# Patient Record
Sex: Female | Born: 1960
Health system: Southern US, Community
[De-identification: ages and names within clinical notes are randomized; demographics above are authoritative.]

## PROBLEM LIST (undated history)

## (undated) DIAGNOSIS — E7800 Pure hypercholesterolemia, unspecified: Secondary | ICD-10-CM

## (undated) DIAGNOSIS — K921 Melena: Secondary | ICD-10-CM

## (undated) DIAGNOSIS — N200 Calculus of kidney: Secondary | ICD-10-CM

## (undated) DIAGNOSIS — S83281A Other tear of lateral meniscus, current injury, right knee, initial encounter: Secondary | ICD-10-CM

## (undated) DIAGNOSIS — K219 Gastro-esophageal reflux disease without esophagitis: Secondary | ICD-10-CM

## (undated) DIAGNOSIS — F419 Anxiety disorder, unspecified: Secondary | ICD-10-CM

## (undated) DIAGNOSIS — Z87442 Personal history of urinary calculi: Secondary | ICD-10-CM

## (undated) DIAGNOSIS — M1990 Unspecified osteoarthritis, unspecified site: Secondary | ICD-10-CM

## (undated) DIAGNOSIS — S83241A Other tear of medial meniscus, current injury, right knee, initial encounter: Secondary | ICD-10-CM

## (undated) DIAGNOSIS — N189 Chronic kidney disease, unspecified: Secondary | ICD-10-CM

## (undated) DIAGNOSIS — I10 Essential (primary) hypertension: Secondary | ICD-10-CM

## (undated) DIAGNOSIS — E785 Hyperlipidemia, unspecified: Secondary | ICD-10-CM

## (undated) HISTORY — DX: Calculus of kidney: N20.0

## (undated) HISTORY — DX: Melena: K92.1

## (undated) HISTORY — DX: Gastro-esophageal reflux disease without esophagitis: K21.9

## (undated) HISTORY — DX: Hyperlipidemia, unspecified: E78.5

---

## 1999-04-15 ENCOUNTER — Emergency Department (HOSPITAL_COMMUNITY): Admission: EM | Admit: 1999-04-15 | Discharge: 1999-04-15 | Payer: Self-pay | Admitting: Emergency Medicine

## 1999-04-15 ENCOUNTER — Encounter: Payer: Self-pay | Admitting: Emergency Medicine

## 2001-12-09 ENCOUNTER — Emergency Department (HOSPITAL_COMMUNITY): Admission: EM | Admit: 2001-12-09 | Discharge: 2001-12-09 | Payer: Self-pay

## 2003-08-09 ENCOUNTER — Emergency Department (HOSPITAL_COMMUNITY): Admission: EM | Admit: 2003-08-09 | Discharge: 2003-08-09 | Payer: Self-pay | Admitting: Emergency Medicine

## 2006-04-28 ENCOUNTER — Other Ambulatory Visit: Admission: RE | Admit: 2006-04-28 | Discharge: 2006-04-28 | Payer: Self-pay | Admitting: *Deleted

## 2006-05-12 ENCOUNTER — Encounter (INDEPENDENT_AMBULATORY_CARE_PROVIDER_SITE_OTHER): Payer: Self-pay | Admitting: Specialist

## 2006-05-12 ENCOUNTER — Ambulatory Visit (HOSPITAL_COMMUNITY): Admission: RE | Admit: 2006-05-12 | Discharge: 2006-05-12 | Payer: Self-pay | Admitting: Gastroenterology

## 2006-12-20 ENCOUNTER — Emergency Department (HOSPITAL_COMMUNITY): Admission: EM | Admit: 2006-12-20 | Discharge: 2006-12-20 | Payer: Self-pay | Admitting: Emergency Medicine

## 2007-07-26 ENCOUNTER — Emergency Department (HOSPITAL_COMMUNITY): Admission: EM | Admit: 2007-07-26 | Discharge: 2007-07-26 | Payer: Self-pay | Admitting: Family Medicine

## 2007-12-21 ENCOUNTER — Emergency Department (HOSPITAL_COMMUNITY): Admission: EM | Admit: 2007-12-21 | Discharge: 2007-12-21 | Payer: Self-pay | Admitting: Family Medicine

## 2008-03-12 ENCOUNTER — Emergency Department (HOSPITAL_COMMUNITY): Admission: EM | Admit: 2008-03-12 | Discharge: 2008-03-12 | Payer: Self-pay | Admitting: Emergency Medicine

## 2008-07-13 ENCOUNTER — Emergency Department (HOSPITAL_COMMUNITY): Admission: EM | Admit: 2008-07-13 | Discharge: 2008-07-13 | Payer: Self-pay | Admitting: Family Medicine

## 2008-09-12 ENCOUNTER — Encounter: Admission: RE | Admit: 2008-09-12 | Discharge: 2008-09-12 | Payer: Self-pay | Admitting: Internal Medicine

## 2009-04-04 HISTORY — PX: OTHER SURGICAL HISTORY: SHX169

## 2009-04-04 HISTORY — PX: BREAST EXCISIONAL BIOPSY: SUR124

## 2009-07-24 ENCOUNTER — Emergency Department (HOSPITAL_COMMUNITY): Admission: EM | Admit: 2009-07-24 | Discharge: 2009-07-24 | Payer: Self-pay | Admitting: Emergency Medicine

## 2009-12-28 ENCOUNTER — Encounter: Admission: RE | Admit: 2009-12-28 | Discharge: 2009-12-28 | Payer: Self-pay | Admitting: Cardiology

## 2010-01-07 ENCOUNTER — Encounter: Admission: RE | Admit: 2010-01-07 | Discharge: 2010-01-07 | Payer: Self-pay | Admitting: Cardiology

## 2010-01-21 ENCOUNTER — Encounter: Admission: RE | Admit: 2010-01-21 | Discharge: 2010-01-21 | Payer: Self-pay | Admitting: General Surgery

## 2010-01-21 ENCOUNTER — Ambulatory Visit (HOSPITAL_BASED_OUTPATIENT_CLINIC_OR_DEPARTMENT_OTHER): Admission: RE | Admit: 2010-01-21 | Discharge: 2010-01-21 | Payer: Self-pay | Admitting: General Surgery

## 2010-04-24 ENCOUNTER — Encounter: Payer: Self-pay | Admitting: Cardiology

## 2010-04-25 ENCOUNTER — Encounter: Payer: Self-pay | Admitting: *Deleted

## 2010-06-16 LAB — BASIC METABOLIC PANEL
BUN: 8 mg/dL (ref 6–23)
CO2: 29 mEq/L (ref 19–32)
Calcium: 9.6 mg/dL (ref 8.4–10.5)
Creatinine, Ser: 0.82 mg/dL (ref 0.4–1.2)
GFR calc non Af Amer: >60 mL/min (ref >60)
Glucose, Bld: 100 mg/dL — ABNORMAL HIGH (ref 70–99)

## 2010-06-16 LAB — DIFFERENTIAL
Basophils Absolute: 0 10*3/uL (ref 0.0–0.1)
Basophils Relative: 0 % (ref 0–1)
Eosinophils Absolute: 0.2 10*3/uL (ref 0.0–0.7)
Monocytes Absolute: 0.7 10*3/uL (ref 0.1–1.0)
Neutro Abs: 6.4 10*3/uL (ref 1.7–7.7)
Neutrophils Relative %: 70 % (ref 43–77)

## 2010-06-16 LAB — CBC
MCH: 28.4 pg (ref 26.0–34.0)
MCHC: 32.6 g/dL (ref 30.0–36.0)
RDW: 13.2 % (ref 11.5–15.5)

## 2010-06-30 ENCOUNTER — Inpatient Hospital Stay (INDEPENDENT_AMBULATORY_CARE_PROVIDER_SITE_OTHER)
Admission: RE | Admit: 2010-06-30 | Discharge: 2010-06-30 | Disposition: A | Discharge status: Home / Self Care | Payer: 59 | Source: Ambulatory Visit | Attending: Family Medicine | Admitting: Family Medicine

## 2010-06-30 DIAGNOSIS — J309 Allergic rhinitis, unspecified: Secondary | ICD-10-CM

## 2010-08-20 NOTE — Op Note (Signed)
Alice Ryan, Alice Ryan            ACCOUNT NO.:  [id number]   MEDICAL RECORD NO.:  [id number]          PATIENT TYPE:  AMB   LOCATION:  ENDO                         FACILITY:  [facility]   PHYSICIAN:  Shirley Friar, MDDATE OF BIRTH:  1960/08/30   DATE OF PROCEDURE:  05/12/2006  DATE OF DISCHARGE:                               OPERATIVE REPORT   PROCEDURE:  Upper endoscopy.   INDICATIONS:  Iron deficiency anemia.   MEDICATIONS:  Fentanyl 25 mcg IV, additional medicines given with  preceding colonoscopy.   FINDINGS:  Endoscope was inserted to the oropharynx and esophagus was  intubated which was normal in its entirety.  Endoscope was advanced down  into the stomach which revealed normal mucosal lining without any ulcers  or erythema.  Retroflexion was done which revealed a medium sized  sliding hiatal hernia but otherwise normal proximal stomach.  Endoscope  was straightened advanced down to the duodenal bulb and second portion  of duodenum which both appeared normal.  Two biopsies were taken in the  second portion of duodenum to evaluate for celiac sprue.  Endoscope was  withdrawn to confirm the above findings.   ASSESSMENT:  1. Medium sized sliding hiatal hernia, otherwise normal EGD.  2. Status post duodenal biopsies to evaluate for celiac sprue.   PLAN:  1. Follow-up on pathology but suspect that if biopsies negative then      iron-deficiency anemia secondary to menstrual losses.  2. Start iron pills twice a day.      Shirley Friar, MD  Electronically Signed     VCS/MEDQ  D:  05/12/2006  T:  05/13/2006  Job:  [id number]   cc:   Bess Kinds, MD

## 2010-08-20 NOTE — Op Note (Signed)
Alice Ryan, Alice Ryan            ACCOUNT NO.:  [id number]   MEDICAL RECORD NO.:  [id number]          PATIENT TYPE:  AMB   LOCATION:  ENDO                         FACILITY:  [facility]   PHYSICIAN:  Shirley Friar, MDDATE OF BIRTH:  [birth year]   DATE OF PROCEDURE:  05/12/2006  DATE OF DISCHARGE:                               OPERATIVE REPORT   PROCEDURE:  Colonoscopy.   INDICATION:  Rectal bleeding, anemia.   MEDICATIONS:  Fentanyl 75 mcg IV, Versed  10 mg IV.   FINDINGS:  Rectal exam was normal.  A pediatric Pentax colonoscope was  inserted into a well prepped colon and advanced to the cecum where the  ileocecal valve and appendiceal orifice were identified.  Despite  multiple attempts, I was unable to intubate the terminal ileum, but the  opening appeared to be normal in appearance.  On careful withdrawal of  the colonoscope, no mucosal abnormalities were noted.  Retroflexion  revealed small internal hemorrhoids.   ASSESSMENT:  Small internal hemorrhoids otherwise normal colonoscopy.   PLAN:  1. High-fiber diet.  2. Anusol suppositories as needed.  3. Proceed with EGD.      Shirley Friar, MD  Electronically Signed     VCS/MEDQ  D:  05/12/2006  T:  05/13/2006  Job:  [id number]   cc:   Bess Kinds, MD

## 2010-10-20 ENCOUNTER — Other Ambulatory Visit: Payer: Self-pay | Admitting: Cardiology

## 2010-10-20 DIAGNOSIS — R921 Mammographic calcification found on diagnostic imaging of breast: Secondary | ICD-10-CM

## 2010-12-30 ENCOUNTER — Ambulatory Visit
Admission: RE | Admit: 2010-12-30 | Discharge: 2010-12-30 | Disposition: A | Discharge status: Home / Self Care | Payer: 59 | Source: Ambulatory Visit | Attending: Cardiology | Admitting: Cardiology

## 2010-12-30 DIAGNOSIS — R921 Mammographic calcification found on diagnostic imaging of breast: Secondary | ICD-10-CM

## 2011-01-07 LAB — DIFFERENTIAL
Basophils Absolute: 0 10*3/uL (ref 0.0–0.1)
Basophils Relative: 0 % (ref 0–1)
Lymphocytes Relative: 20 % (ref 12–46)
Monocytes Absolute: 0.8 10*3/uL (ref 0.1–1.0)
Neutro Abs: 5.9 10*3/uL (ref 1.7–7.7)
Neutrophils Relative %: 68 % (ref 43–77)

## 2011-01-07 LAB — B-NATRIURETIC PEPTIDE (CONVERTED LAB): Pro B Natriuretic peptide (BNP): 36 pg/mL (ref 0.0–100.0)

## 2011-01-07 LAB — BASIC METABOLIC PANEL
CO2: 26 mEq/L (ref 19–32)
Calcium: 9.1 mg/dL (ref 8.4–10.5)
Creatinine, Ser: 0.8 mg/dL (ref 0.4–1.2)
GFR calc Af Amer: >60 mL/min (ref >60)
GFR calc non Af Amer: >60 mL/min (ref >60)
Glucose, Bld: 100 mg/dL — ABNORMAL HIGH (ref 70–99)
Sodium: 134 mEq/L — ABNORMAL LOW (ref 135–145)

## 2011-01-07 LAB — POCT CARDIAC MARKERS
CKMB, poc: <1 ng/mL — ABNORMAL LOW (ref 1.0–8.0)
Myoglobin, poc: 62.2 ng/mL (ref 12–200)

## 2011-01-07 LAB — CBC
Hemoglobin: 11.4 g/dL — ABNORMAL LOW (ref 12.0–15.0)
MCHC: 32.4 g/dL (ref 30.0–36.0)
RDW: 14.7 % (ref 11.5–15.5)

## 2011-05-21 ENCOUNTER — Encounter (HOSPITAL_COMMUNITY): Payer: Self-pay

## 2011-05-21 ENCOUNTER — Emergency Department (INDEPENDENT_AMBULATORY_CARE_PROVIDER_SITE_OTHER)
Admission: EM | Admit: 2011-05-21 | Discharge: 2011-05-21 | Disposition: A | Discharge status: Home / Self Care | Payer: Self-pay | Source: Home / Self Care | Attending: Family Medicine | Admitting: Family Medicine

## 2011-05-21 DIAGNOSIS — M67929 Unspecified disorder of synovium and tendon, unspecified upper arm: Secondary | ICD-10-CM

## 2011-05-21 DIAGNOSIS — M25519 Pain in unspecified shoulder: Secondary | ICD-10-CM

## 2011-05-21 DIAGNOSIS — M7520 Bicipital tendinitis, unspecified shoulder: Secondary | ICD-10-CM

## 2011-05-21 HISTORY — DX: Pure hypercholesterolemia, unspecified: E78.00

## 2011-05-21 HISTORY — DX: Essential (primary) hypertension: I10

## 2011-05-21 HISTORY — DX: Gastro-esophageal reflux disease without esophagitis: K21.9
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
## 2011-05-21 NOTE — Discharge Instructions (Signed)
You may take one to two naproxen (Aleve) tablets every 12 hours over the next 5 to 7 days, or until symptoms improve. You may also use ibuprofen 400 to 600 mg every 8 hours. Do not use both. Return to care should your symptoms not improve, or worsen in any way, such as numbness, tingling, or weakness or loss of grip strength.

## 2011-10-08 ENCOUNTER — Encounter (HOSPITAL_COMMUNITY): Payer: Self-pay | Admitting: *Deleted

## 2011-10-08 ENCOUNTER — Emergency Department (HOSPITAL_COMMUNITY)
Admission: EM | Admit: 2011-10-08 | Discharge: 2011-10-08 | Disposition: A | Discharge status: Home / Self Care | Payer: 59 | Attending: Emergency Medicine | Admitting: Emergency Medicine

## 2011-10-08 DIAGNOSIS — E7800 Pure hypercholesterolemia, unspecified: Secondary | ICD-10-CM | POA: Insufficient documentation

## 2011-10-08 DIAGNOSIS — Z91013 Allergy to seafood: Secondary | ICD-10-CM | POA: Insufficient documentation

## 2011-10-08 DIAGNOSIS — K219 Gastro-esophageal reflux disease without esophagitis: Secondary | ICD-10-CM | POA: Insufficient documentation

## 2011-10-08 DIAGNOSIS — A09 Infectious gastroenteritis and colitis, unspecified: Secondary | ICD-10-CM | POA: Insufficient documentation

## 2011-10-08 DIAGNOSIS — I10 Essential (primary) hypertension: Secondary | ICD-10-CM | POA: Insufficient documentation

## 2011-10-08 MED ORDER — METRONIDAZOLE 500 MG PO TABS: 500.0000 mg | ORAL_TABLET | Freq: Three times a day (TID) | ORAL | Status: AC

## 2011-10-08 NOTE — ED Notes (Signed)
Pt has seen for rash 2.5/3 weeks ago to left abdomen. States was rx'd antibiotics and prednisone. Pt reports she has had diarrhea going on 8th day. Reports nausea and 1 episode of vomiting after eating banana. Reports for the last week no nausea or vomiting.

## 2011-10-08 NOTE — ED Notes (Signed)
Discharged with instructions and verbalizes an understanding.

## 2011-10-08 NOTE — ED Provider Notes (Signed)
History     CSN: [id number]  Arrival date & time 10/08/11  0714   None     Chief Complaint  Patient presents with  . Diarrhea  . Rash    (Consider location/radiation/quality/duration/timing/severity/associated sxs/prior treatment) HPI  51 y.o. F p/w diarrhea of 7 days duration; started 3 days after a  7 day course of cephalexin for treatment of cellulitis of the left trunk; minimally improved over the course of 7 days; has noticed blood in the stool, but this has been present prior to the diarrhea, she associates the blood with internal hemorrhoids and says she had a colonoscopy to r/o cancer; diarrhea not alleviated by probiotics, imodium, and yogurt; diarrhea exacerbated by eating a banana last night; accompanied by vomiting x 2; denies diminished appetite or feeling dehydrated;  no history of C. Diff; Patient works the 7 PM - 7 AM shift on [address] at [employer]  Past Medical History  Diagnosis Date  . Hypertension   . Hypercholesteremia   . Acid reflux     Past Surgical History  Procedure Date  . Lumpectomy right breast 2011    benign    History reviewed. No pertinent family history.  History  Substance Use Topics  . Smoking status: Never Smoker   . Smokeless tobacco: Not on file  . Alcohol Use: No    OB History    Grav Para Term Preterm Abortions TAB SAB Ect Mult Living                  Review of Systems  Constitutional: Negative for fever, chills, diaphoresis, activity change and unexpected weight change.  HENT: Negative.   Eyes: Negative.   Respiratory: Negative.   Cardiovascular: Negative.   Gastrointestinal: Negative for abdominal distention.  Genitourinary: Negative.   Musculoskeletal: Negative.   Neurological: Negative.   Hematological: Negative.   Psychiatric/Behavioral: Negative.     Allergies  Apple; Shellfish allergy; and Peanut butter flavor  Home Medications   Current Outpatient Rx  Name Route Sig Dispense Refill  . ATORVASTATIN CALCIUM  10 MG PO TABS Oral Take 10 mg by mouth daily.    Marland Kitchen ESOMEPRAZOLE MAGNESIUM 40 MG PO CPDR Oral Take 40 mg by mouth daily before breakfast.    . OLMESARTAN MEDOXOMIL 20 MG PO TABS Oral Take 20 mg by mouth daily.     BP 125/74  Pulse 76  Temp 98.5 F (36.9 C) (Oral)  Resp 14  SpO2 100%   Physical Exam  Constitutional: She is oriented to person, place, and time. She appears well-developed and well-nourished. No distress.  HENT:  Head: Normocephalic and atraumatic.  Eyes: Conjunctivae and EOM are normal. Pupils are equal, round, and reactive to light.  Neck: Normal range of motion. Neck supple.  Cardiovascular: Normal rate, regular rhythm and normal heart sounds.   Pulmonary/Chest: Effort normal and breath sounds normal.  Abdominal: Soft. Bowel sounds are normal. She exhibits no distension and no mass. There is no tenderness. There is no guarding.  Neurological: She is alert and oriented to person, place, and time.  Skin: Skin is warm and dry. No rash noted. She is not diaphoretic.  Psychiatric: She has a normal mood and affect. Her behavior is normal.    ED Course  Procedures (including critical care time)   Labs Reviewed  CLOSTRIDIUM DIFFICILE BY PCR   No results found.   1. Infectious diarrhea       MDM  The patient has a history of recent antibiotic  use preceding her diarrhea and works in the hospital, so she has risk factors for C. Diff colitis. Therefore, I will treat her presumptively for mild C. Diff colitis and follow-up the stool culture that was ordered. She will need follow up in a non-emergent setting as long as she remains clinically stable.         Alice Buddy, MD 10/08/11 [phone]

## 2011-10-08 NOTE — ED Provider Notes (Signed)
I saw and evaluated the patient, reviewed the resident's note and I agree with the findings and plan.   .Face to face Exam:  General:  Awake HEENT:  Atraumatic Resp:  Normal effort Abd:  Nondistended Neuro:No focal weakness Lymph: No adenopathy   Nelia Shi, MD 10/08/11 [phone]

## 2011-10-10 ENCOUNTER — Telehealth: Payer: Self-pay | Admitting: Family Medicine

## 2011-10-10 NOTE — Telephone Encounter (Signed)
Patient is calling to find out results of tests run on Saturday in the ER.  Patient has not picked up the medication because she isn't supposed to start it until she has gotten the results.

## 2011-10-10 NOTE — Telephone Encounter (Signed)
Patient called and alerted that she is C. Diff negative and does not need to fill prescription for Flagyl. She notes that diarrhea is improving, and she will follow-up at [facility] as needed.

## 2012-01-02 ENCOUNTER — Other Ambulatory Visit: Payer: Self-pay | Admitting: Cardiology

## 2012-01-02 DIAGNOSIS — Z1231 Encounter for screening mammogram for malignant neoplasm of breast: Secondary | ICD-10-CM

## 2012-01-09 ENCOUNTER — Ambulatory Visit (INDEPENDENT_AMBULATORY_CARE_PROVIDER_SITE_OTHER): Payer: 59 | Admitting: Family Medicine

## 2012-01-09 ENCOUNTER — Encounter: Payer: Self-pay | Admitting: Family Medicine

## 2012-01-09 VITALS — BP 142/80 | Temp 98.1°F | Ht 61.0 in | Wt 128.5 lb

## 2012-01-09 DIAGNOSIS — K219 Gastro-esophageal reflux disease without esophagitis: Secondary | ICD-10-CM

## 2012-01-09 DIAGNOSIS — Z23 Encounter for immunization: Secondary | ICD-10-CM

## 2012-01-09 DIAGNOSIS — E785 Hyperlipidemia, unspecified: Secondary | ICD-10-CM

## 2012-01-09 DIAGNOSIS — I10 Essential (primary) hypertension: Secondary | ICD-10-CM

## 2012-01-09 LAB — LIPID PANEL
Cholesterol: 230 mg/dL — ABNORMAL HIGH (ref 0–200)
HDL: 69.8 mg/dL (ref >39.00)
Triglycerides: 98 mg/dL (ref 0.0–149.0)

## 2012-01-09 LAB — BASIC METABOLIC PANEL
BUN: 14 mg/dL (ref 6–23)
CO2: 24 mEq/L (ref 19–32)
Calcium: 8.8 mg/dL (ref 8.4–10.5)
Creatinine, Ser: 0.7 mg/dL (ref 0.4–1.2)
GFR: 108.74 mL/min (ref >60.00)
Glucose, Bld: 82 mg/dL (ref 70–99)

## 2012-01-09 NOTE — Patient Instructions (Addendum)
-  We have ordered labs or studies at this visit. It usually takes 1-2 weeks for results and processing. We will contact you with instructions IF your results are abnormal. Normal results will be released to your [facility] in 1-2 weeks. If you have not heard from [place] or can not find your results in [facility] in 2 weeks please contact our office.  We recommend the following healthy lifestyle measures: - eat a healthy diet consisting of lots of vegetables, fruits, beans, nuts, seeds, healthy meats such as white chicken and fish and whole grains.  - avoid fried foods, fast food, processed foods, sodas, red meet and other fattening foods.  - get a least 150 minutes of aerobic exercise per week.   Thank you for enrolling in MyChart. Please follow the instructions below to securely access your online medical record. MyChart allows you to send messages to your doctor, view your test results, renew your prescriptions, schedule appointments, and more.  How Do I Sign Up? 1. In your Internet browser, go to [email or web address] 2. Click on the New  User? link in the Sign In box.  3. Enter your MyChart Access Code exactly as it appears below. You will not need to use this code after you have completed the sign-up process. If you do not sign up before the expiration date, you must request a new code. MyChart Access Code: [email or web address] Expires: 02/08/2012  3:09 PM  4. Enter the last four digits of your Social Security Number (xxxx) and Date of Birth (mm/dd/yyyy) as indicated and click Next. You will be taken to the next sign-up page. 5. Create a MyChart ID. This will be your MyChart login ID and cannot be changed, so think of one that is secure and easy to remember. 6. Create a MyChart password. You can change your password at any time. 7. Enter your Password Reset Question and Answer and click Next. This can be used at a later time if you forget your password.  8. Select your communication  preference, and if applicable enter your e-mail address. You will receive e-mail notification when new information is available in MyChart by choosing to receive e-mail notifications and filling in your e-mail. 9. Click Sign In. You can now view your medical record.   Additional Information If you have questions, you can email [email or web address] or call [phone] to talk to our MyChart staff. Remember, MyChart is NOT to be used for urgent needs. For medical emergencies, dial 911.

## 2012-01-09 NOTE — Progress Notes (Signed)
Chief Complaint  Patient presents with  . Establish Care    HPI:   Alice Ryan is here to establish care.   She is currently being treated for the following: HTN: -taking benicar 40/25 -denies: CP, SOB, HA, vision, swelling  Dyslipidemia: -taking atorvastatin 40mg  every other day -stable  GERD: -takes nexium and  -did have have EGD/colonoscopy 7 years ago and not told this needed to be repeated -no symptoms as long as she takes the medication and avoids spicy foods - if she misses doses for a few days has symptoms  Preventive Care: -has not had physical or women's exam -still having regular periods  ROS: See pertinent positives and negatives per HPI.  Past Medical History  Diagnosis Date  . Hypertension   . Hypercholesteremia   . Acid reflux     Family History  Problem Relation Age of Onset  . Cancer Mother     breast cancer at age 89    History   Social History  . Marital Status: Married    Spouse Name: N/A    Number of Children: N/A  . Years of Education: N/A   Social History Main Topics  . Smoking status: Never Smoker   . Smokeless tobacco: None  . Alcohol Use: No  . Drug Use: No  . Sexually Active:    Other Topics Concern  . None   Social History Narrative  . None    Current outpatient prescriptions:atorvastatin (LIPITOR) 10 MG tablet, Take 10 mg by mouth daily., Disp: , Rfl: ;  esomeprazole (NEXIUM) 40 MG capsule, Take 40 mg by mouth daily before breakfast., Disp: , Rfl: ;  olmesartan (BENICAR) 20 MG tablet, Take 20 mg by mouth daily., Disp: , Rfl:   EXAM:  Filed Vitals:   01/09/12 1446  BP: 142/80  Temp: 98.1 F (36.7 C)    Body mass index is 24.28 kg/(m^2).  GENERAL: vitals reviewed and listed below, alert, oriented, appears well hydrated and in no acute distress  HEENT: atraumatic, conjucntiva clear, no obvious abnormalities on inspection of external nose and ears  NECK: no masses on inspection  LUNGS: clear to  auscultation bilaterally, no wheezes, rales or rhonchi, good air movement  CV: HRRR, no peripheral edema  MS: moves all extremities without noticeable abnormality  PSYCH: pleasant and cooperative, no obvious depression or anxiety  ASSESSMENT AND PLAN:  Discussed the following assessment and plan:  1. Hypertension  Basic metabolic panel, Hemoglobin A1c  2. Hyperlipidemia  Lipid Panel  3. GERD (gastroesophageal reflux disease)     -reviewed/updated PMH, SH, FH, Meds -labs per orders -flu vaccine -advised healthy lifestyle -follow up in 1 month for CPE with pap  Orders Placed This Encounter  Procedures  . Basic metabolic panel  . Lipid Panel  . Hemoglobin A1c    Patient Instructions  -We have ordered labs or studies at this visit. It usually takes 1-2 weeks for results and processing. We will contact you with instructions IF your results are abnormal. Normal results will be released to your [facility] in 1-2 weeks. If you have not heard from [place] or can not find your results in [facility] in 2 weeks please contact our office.  We recommend the following healthy lifestyle measures: - eat a healthy diet consisting of lots of vegetables, fruits, beans, nuts, seeds, healthy meats such as white chicken and fish and whole grains.  - avoid fried foods, fast food, processed foods, sodas, red meet and other fattening foods.  -  get a least 150 minutes of aerobic exercise per week.   Thank you for enrolling in MyChart. Please follow the instructions below to securely access your online medical record. MyChart allows you to send messages to your doctor, view your test results, renew your prescriptions, schedule appointments, and more.  How Do I Sign Up? 1. In your Internet browser, go to [email or web address] 2. Click on the New  User? link in the Sign In box.  3. Enter your MyChart Access Code exactly as it appears below. You will not need to use this code after you have completed  the sign-up process. If you do not sign up before the expiration date, you must request a new code. MyChart Access Code: [email or web address] Expires: 02/08/2012  3:09 PM  4. Enter the last four digits of your Social Security Number (xxxx) and Date of Birth (mm/dd/yyyy) as indicated and click Next. You will be taken to the next sign-up page. 5. Create a MyChart ID. This will be your MyChart login ID and cannot be changed, so think of one that is secure and easy to remember. 6. Create a MyChart password. You can change your password at any time. 7. Enter your Password Reset Question and Answer and click Next. This can be used at a later time if you forget your password.  8. Select your communication preference, and if applicable enter your e-mail address. You will receive e-mail notification when new information is available in MyChart by choosing to receive e-mail notifications and filling in your e-mail. 9. Click Sign In. You can now view your medical record.   Additional Information If you have questions, you can email [email or web address] or call [phone] to talk to our MyChart staff. Remember, MyChart is NOT to be used for urgent needs. For medical emergencies, dial 911.           Return to clinic immediately if symptoms worsen or persist or new concerns.  Return in about 1 month (around 02/09/2012) for CPE with pap.  Kriste Basque R.

## 2012-01-10 NOTE — Progress Notes (Signed)
Quick Note:  Called and spoke with pt and pt is aware. ______ 

## 2012-01-16 ENCOUNTER — Other Ambulatory Visit: Payer: Self-pay | Admitting: Family Medicine

## 2012-01-16 ENCOUNTER — Ambulatory Visit
Admission: RE | Admit: 2012-01-16 | Discharge: 2012-01-16 | Disposition: A | Discharge status: Home / Self Care | Payer: 59 | Source: Ambulatory Visit | Attending: Cardiology | Admitting: Cardiology

## 2012-01-16 DIAGNOSIS — Z1231 Encounter for screening mammogram for malignant neoplasm of breast: Secondary | ICD-10-CM

## 2012-01-20 NOTE — Progress Notes (Signed)
Quick Note:  Left a message for pt to return call. ______ 

## 2012-01-23 NOTE — Progress Notes (Signed)
Quick Note:  Called and spoke with pt and pt is aware. ______ 

## 2012-02-13 ENCOUNTER — Encounter: Payer: 59 | Admitting: Family Medicine

## 2012-02-13 DIAGNOSIS — Z0289 Encounter for other administrative examinations: Secondary | ICD-10-CM

## 2012-02-13 NOTE — Progress Notes (Signed)
No show  This encounter was created in error - please disregard.

## 2012-05-21 ENCOUNTER — Other Ambulatory Visit: Payer: Self-pay

## 2012-05-21 MED ORDER — PANTOPRAZOLE SODIUM 40 MG PO TBEC: DELAYED_RELEASE_TABLET | ORAL | Status: DC

## 2012-07-30 ENCOUNTER — Encounter: Payer: 59 | Admitting: Family Medicine

## 2012-07-30 NOTE — Progress Notes (Signed)
Late cancel  This encounter was created in error - please disregard. 

## 2012-08-03 ENCOUNTER — Emergency Department (HOSPITAL_COMMUNITY)
Admission: EM | Admit: 2012-08-03 | Discharge: 2012-08-03 | Disposition: A | Discharge status: Home / Self Care | Payer: BC Managed Care – PPO | Source: Home / Self Care | Attending: Emergency Medicine | Admitting: Emergency Medicine

## 2012-08-03 ENCOUNTER — Encounter (HOSPITAL_COMMUNITY): Payer: Self-pay | Admitting: Emergency Medicine

## 2012-08-03 DIAGNOSIS — J309 Allergic rhinitis, unspecified: Secondary | ICD-10-CM

## 2012-08-03 DIAGNOSIS — J45909 Unspecified asthma, uncomplicated: Secondary | ICD-10-CM

## 2012-08-03 MED ORDER — FEXOFENADINE-PSEUDOEPHED ER 60-120 MG PO TB12: 1.0000 | ORAL_TABLET | Freq: Two times a day (BID) | ORAL | Status: DC

## 2012-08-03 MED ORDER — PREDNISONE 20 MG PO TABS: 20.0000 mg | ORAL_TABLET | Freq: Two times a day (BID) | ORAL | Status: DC

## 2012-08-03 MED ORDER — FLUTICASONE PROPIONATE 50 MCG/ACT NA SUSP: 2.0000 | Freq: Every day | NASAL | Status: DC

## 2012-08-03 MED ORDER — ALBUTEROL SULFATE HFA 108 (90 BASE) MCG/ACT IN AERS: 1.0000 | INHALATION_SPRAY | Freq: Four times a day (QID) | RESPIRATORY_TRACT | Status: DC | PRN

## 2012-08-03 MED ORDER — METHYLPREDNISOLONE ACETATE 80 MG/ML IJ SUSP
INTRAMUSCULAR | Status: AC
  Filled 2012-08-03: qty 1

## 2012-08-03 MED ORDER — OLOPATADINE HCL 0.2 % OP SOLN: OPHTHALMIC | Status: DC

## 2012-08-03 MED ORDER — METHYLPREDNISOLONE ACETATE 80 MG/ML IJ SUSP
80.0000 mg | Freq: Once | INTRAMUSCULAR | Status: AC
  Administered 2012-08-03: 80 mg via INTRAMUSCULAR

## 2012-08-03 NOTE — ED Provider Notes (Signed)
Chief Complaint:   Chief Complaint  Patient presents with  . URI    History of Present Illness:   Alice Ryan is a 52 year old female who has a history of recurring allergies: None for years. At one time she was seen at [facility] allergy clinic and she was about to start on allergy shots, but then she decided against it. She had been doing well up until about 2 weeks ago. She took a trip to [place] and when she got back her allergies got a lot worse. She had a scratchy throat and itchy, watery eyes, wheezing, cough productive of clear sputum, aching in her chest, nasal congestion, rhinorrhea, nasal itching, second sneezing, and some sore throat. She denies fever or chills.  Review of Systems:  Other than noted above, the patient denies any of the following symptoms. Systemic:  No fever, chills, sweats, fatigue, myalgias, headache, or anorexia. Eye:  No redness, itching, watering, pain or drainage. ENT:  No earache, ear congestion, nasal congestion, sneezing, nasal itching, rhinorrhea, sinus pressure, sinus pain, post nasal drip, or sore throat. Lungs:  No cough, sputum production, wheezing, shortness of breath, or chest pain. Skin:  No rash or itching.  PMFSH:  Past medical history, family history, social history, meds, and allergies were reviewed.  No history of asthma. No use of tobacco. She has a history of hypertension, hypercholesterolemia, and GERD. She takes Lipitor, Nexium, and Benicar.  Physical Exam:   Vital signs:  BP 133/61  Pulse 88  Temp(Src) 98.4 F (36.9 C) (Oral)  Resp 20  SpO2 99%  LMP 07/17/2012 General:  Alert, in no distress. Eye:  No conjunctival injection or drainage. Lids were normal. ENT:  TMs and canals were normal, without erythema or inflammation.  Nasal mucosa was congested and erythematous with clear drainage.  Mucous membranes were moist.  Pharynx was clear, without exudate or drainage.  There were no oral ulcerations or lesions. Neck:  Supple, no  adenopathy, tenderness or mass. Lungs:  No respiratory distress.  Lungs were clear to auscultation, without wheezes, rales or rhonchi.  Breath sounds were clear and equal bilaterally. Heart:  Regular rhythm, without gallops, murmers or rubs. Skin:  Clear, warm, and dry, without rash or lesions.  Course in Urgent Care Center:   Given Depo-Medrol 80 mg IM.  Assessment:  The primary encounter diagnosis was Allergic rhinitis. A diagnosis of Asthma was also pertinent to this visit.  Plan:   1.  The following meds were prescribed:   Discharge Medication List as of 08/03/2012  6:54 PM    START taking these medications   Details  albuterol (PROVENTIL HFA;VENTOLIN HFA) 108 (90 BASE) MCG/ACT inhaler Inhale 1-2 puffs into the lungs every 6 (six) hours as needed for wheezing., Starting 08/03/2012, Until Discontinued, Normal    fexofenadine-pseudoephedrine (ALLEGRA-D) 60-120 MG per tablet Take 1 tablet by mouth every 12 (twelve) hours., Starting 08/03/2012, Until Discontinued, Normal    fluticasone (FLONASE) 50 MCG/ACT nasal spray Place 2 sprays into the nose daily., Starting 08/03/2012, Until Discontinued, Normal    Olopatadine HCl (PATADAY) 0.2 % SOLN 1 drop in each eye once daily for allergies., Normal    predniSONE (DELTASONE) 20 MG tablet Take 1 tablet (20 mg total) by mouth 2 (two) times daily., Starting 08/03/2012, Until Discontinued, Normal       2.  The patient was instructed in symptomatic care and handouts were given. 3.  The patient was told to return if becoming worse in any way, if no  better in 3 or 4 days, and given some red flag symptoms such as fever, difficulty breathing, or worsening pain that would indicate earlier return. 4.  The patient was also instructed in allergen avoidance. 5.  Follow up with primary care physician or allergist in 2 weeks if symptoms persist.      Reuben Likes, MD 08/03/12 2132

## 2012-08-03 NOTE — ED Notes (Signed)
While cutting grass onset of runny nose, wheezing, pain in chest with breathing and cough is hurting chest. Has had symptoms for 2 weeks, but while doing yard work today, symptoms worsened

## 2012-11-13 ENCOUNTER — Other Ambulatory Visit (INDEPENDENT_AMBULATORY_CARE_PROVIDER_SITE_OTHER): Payer: BC Managed Care – PPO

## 2012-11-13 DIAGNOSIS — Z0000 Encounter for general adult medical examination without abnormal findings: Secondary | ICD-10-CM

## 2012-11-13 DIAGNOSIS — R7989 Other specified abnormal findings of blood chemistry: Secondary | ICD-10-CM

## 2012-11-13 LAB — BASIC METABOLIC PANEL
BUN: 16 mg/dL (ref 6–23)
Chloride: 105 mEq/L (ref 96–112)
Creatinine, Ser: 0.9 mg/dL (ref 0.4–1.2)
GFR: 89.7 mL/min (ref >60.00)
Potassium: 3.7 mEq/L (ref 3.5–5.1)

## 2012-11-13 LAB — LIPID PANEL
Cholesterol: 259 mg/dL — ABNORMAL HIGH (ref 0–200)
Total CHOL/HDL Ratio: 4
Triglycerides: 126 mg/dL (ref 0.0–149.0)
VLDL: 25.2 mg/dL (ref 0.0–40.0)

## 2012-11-13 NOTE — Addendum Note (Signed)
Addended by: Bonnye Fava on: 11/13/2012 09:48 AM   Modules accepted: Orders

## 2012-11-20 ENCOUNTER — Encounter: Payer: BC Managed Care – PPO | Admitting: Family Medicine

## 2012-11-20 DIAGNOSIS — Z0289 Encounter for other administrative examinations: Secondary | ICD-10-CM

## 2012-11-20 NOTE — Progress Notes (Signed)
Error   This encounter was created in error - please disregard. 

## 2012-12-23 ENCOUNTER — Emergency Department (HOSPITAL_COMMUNITY)
Admission: EM | Admit: 2012-12-23 | Discharge: 2012-12-23 | Disposition: A | Discharge status: Home / Self Care | Payer: BC Managed Care – PPO | Attending: Emergency Medicine | Admitting: Emergency Medicine

## 2012-12-23 ENCOUNTER — Encounter (HOSPITAL_COMMUNITY): Payer: Self-pay | Admitting: *Deleted

## 2012-12-23 DIAGNOSIS — Z87442 Personal history of urinary calculi: Secondary | ICD-10-CM | POA: Insufficient documentation

## 2012-12-23 DIAGNOSIS — Z862 Personal history of diseases of the blood and blood-forming organs and certain disorders involving the immune mechanism: Secondary | ICD-10-CM | POA: Insufficient documentation

## 2012-12-23 DIAGNOSIS — I10 Essential (primary) hypertension: Secondary | ICD-10-CM

## 2012-12-23 DIAGNOSIS — Z8639 Personal history of other endocrine, nutritional and metabolic disease: Secondary | ICD-10-CM | POA: Insufficient documentation

## 2012-12-23 DIAGNOSIS — K219 Gastro-esophageal reflux disease without esophagitis: Secondary | ICD-10-CM | POA: Insufficient documentation

## 2012-12-23 DIAGNOSIS — Z79899 Other long term (current) drug therapy: Secondary | ICD-10-CM | POA: Insufficient documentation

## 2012-12-23 NOTE — ED Notes (Signed)
Pt states that she took her BP and systolic was 230. She had a HA, and throbbing behind left left eye earlier. HA and throbbing resolved.

## 2012-12-23 NOTE — ED Provider Notes (Signed)
CSN: [id number]     Arrival date & time 12/23/12  0058 History   First MD Initiated Contact with Patient 12/23/12 [phone]     Chief Complaint  Patient presents with  . Hypertension   Patient is a 52 y.o. female presenting with hypertension. The history is provided by the patient.  Hypertension This is a recurrent problem. Episode onset: tonight. The problem occurs constantly. The problem has been gradually improving. Pertinent negatives include no chest pain and no shortness of breath. Nothing aggravates the symptoms. The symptoms are relieved by rest.  pt reports she had headache recently but it is now resolved She reports she checked her BP tonight and SPB >230 No cp/sob No focal weakness reported  Past Medical History  Diagnosis Date  . Hypertension   . Hypercholesteremia   . Acid reflux   . Blood in stool   . GERD (gastroesophageal reflux disease)   . Hypertension   . Hyperlipidemia   . Kidney stones    Past Surgical History  Procedure Laterality Date  . Lumpectomy right breast  2011    benign   Family History  Problem Relation Age of Onset  . Cancer Mother     breast cancer at age 30  . Stroke Mother   . Hypertension Mother   . Hypertension Father    History  Substance Use Topics  . Smoking status: Never Smoker   . Smokeless tobacco: Not on file  . Alcohol Use: No   OB History   Grav Para Term Preterm Abortions TAB SAB Ect Mult Living                 Review of Systems  Respiratory: Negative for shortness of breath.   Cardiovascular: Negative for chest pain.  Gastrointestinal: Negative for vomiting.  Neurological: Negative for weakness.  All other systems reviewed and are negative.    Allergies  Apple; Shellfish allergy; and Peanut butter flavor  Home Medications   Current Outpatient Rx  Name  Route  Sig  Dispense  Refill  . acetaminophen (TYLENOL) 500 MG tablet   Oral   Take 500 mg by mouth every 6 (six) hours as needed for pain.         Marland Kitchen  esomeprazole (NEXIUM) 40 MG capsule   Oral   Take 40 mg by mouth daily before breakfast.         . olmesartan (BENICAR) 20 MG tablet   Oral   Take 20 mg by mouth daily.         Marland Kitchen albuterol (PROVENTIL HFA;VENTOLIN HFA) 108 (90 BASE) MCG/ACT inhaler   Inhalation   Inhale 1-2 puffs into the lungs every 6 (six) hours as needed for wheezing.   1 Inhaler   0    BP 165/93  Pulse 82  Temp(Src) 98.3 F (36.8 C) (Oral)  Resp 16  Ht 5\' 1"  (1.549 m)  Wt 135 lb (61.236 kg)  BMI 25.52 kg/m2  SpO2 100% Physical Exam CONSTITUTIONAL: Well developed/well nourished HEAD: Normocephalic/atraumatic EYES: EOMI/PERRL, no nystagmus ENMT: Mucous membranes moist NECK: supple no meningeal signs CV: S1/S2 noted, no murmurs/rubs/gallops noted LUNGS: Lungs are clear to auscultation bilaterally, no apparent distress ABDOMEN: soft, nontender, no rebound or guarding NEURO:Awake/alert, facies symmetric, no arm or leg drift is noted Cranial nerves 3/4/5/6/10/10/08/11/12 tested and intact Gait normal without ataxia No past pointing EXTREMITIES: pulses normal, full ROM SKIN: warm, color normal PSYCH: no abnormalities of mood noted   ED Course  Procedures  MDM    Pt stable no signs of hypertensive emergency at this time She feels comfortable with plan of d/c home and f/u with PCP  1. HTN (hypertension)    Nursing notes including past medical history and social history reviewed and considered in documentation Previous records reviewed and considered - recent labs reviewed      Joya Gaskins, MD 12/23/12 [phone]

## 2012-12-28 ENCOUNTER — Ambulatory Visit (INDEPENDENT_AMBULATORY_CARE_PROVIDER_SITE_OTHER): Payer: BC Managed Care – PPO | Admitting: Family Medicine

## 2012-12-28 ENCOUNTER — Encounter: Payer: Self-pay | Admitting: Family Medicine

## 2012-12-28 VITALS — BP 130/70 | Temp 98.7°F | Wt 124.0 lb

## 2012-12-28 DIAGNOSIS — I10 Essential (primary) hypertension: Secondary | ICD-10-CM

## 2012-12-28 DIAGNOSIS — Z23 Encounter for immunization: Secondary | ICD-10-CM

## 2012-12-28 DIAGNOSIS — R7309 Other abnormal glucose: Secondary | ICD-10-CM

## 2012-12-28 DIAGNOSIS — E785 Hyperlipidemia, unspecified: Secondary | ICD-10-CM

## 2012-12-28 DIAGNOSIS — K219 Gastro-esophageal reflux disease without esophagitis: Secondary | ICD-10-CM

## 2012-12-28 DIAGNOSIS — R7303 Prediabetes: Secondary | ICD-10-CM

## 2012-12-28 MED ORDER — LISINOPRIL 10 MG PO TABS: 10.0000 mg | ORAL_TABLET | Freq: Every day | ORAL | Status: DC

## 2012-12-28 MED ORDER — PRAVASTATIN SODIUM 20 MG PO TABS: 20.0000 mg | ORAL_TABLET | Freq: Every day | ORAL | Status: DC

## 2012-12-28 MED ORDER — ESOMEPRAZOLE MAGNESIUM 40 MG PO CPDR: 40.0000 mg | DELAYED_RELEASE_CAPSULE | Freq: Every day | ORAL | Status: DC

## 2012-12-28 NOTE — Progress Notes (Signed)
Chief Complaint  Patient presents with  . Post ER follow up    HPI:  Alice Ryan is here to follow up on her HTN: -seen in ED on 12/23/12 for elevated BP at home, in ED was 165/93 and she was asymptomatic so was sent home with advise to follow up with PCP -she was last seen here about a year ago for an initial visit and was told to follow up in 1 month, but did not -she reports she only takes her BP medication once per week -she is out of date on HM measures and has been advised ned CPE with pap -denies: CP, SOB, DOE, HA, swelling  HLD: -has not taken lipitor in a long time  -used to use it  GERD: -needs refill on nexium   ROS: See pertinent positives and negatives per HPI.  Past Medical History  Diagnosis Date  . Hypertension   . Hypercholesteremia   . Acid reflux   . Blood in stool   . GERD (gastroesophageal reflux disease)   . Hypertension   . Hyperlipidemia   . Kidney stones     Past Surgical History  Procedure Laterality Date  . Lumpectomy right breast  2011    benign    Family History  Problem Relation Age of Onset  . Cancer Mother     breast cancer at age 12  . Stroke Mother   . Hypertension Mother   . Hypertension Father     History   Social History  . Marital Status: Married    Spouse Name: N/A    Number of Children: N/A  . Years of Education: N/A   Social History Main Topics  . Smoking status: Never Smoker   . Smokeless tobacco: None  . Alcohol Use: No  . Drug Use: No  . Sexual Activity:    Other Topics Concern  . None   Social History Narrative  . None    Current outpatient prescriptions:acetaminophen (TYLENOL) 500 MG tablet, Take 500 mg by mouth every 6 (six) hours as needed for pain., Disp: , Rfl: ;  albuterol (PROVENTIL HFA;VENTOLIN HFA) 108 (90 BASE) MCG/ACT inhaler, Inhale 1-2 puffs into the lungs every 6 (six) hours as needed for wheezing., Disp: 1 Inhaler, Rfl: 0;  esomeprazole (NEXIUM) 40 MG capsule, Take 1 capsule (40  mg total) by mouth daily before breakfast., Disp: 90 capsule, Rfl: 3 lisinopril (PRINIVIL,ZESTRIL) 10 MG tablet, Take 1 tablet (10 mg total) by mouth daily., Disp: 30 tablet, Rfl: 1;  pravastatin (PRAVACHOL) 20 MG tablet, Take 1 tablet (20 mg total) by mouth daily., Disp: 30 tablet, Rfl: 1  EXAM:  Filed Vitals:   12/28/12 1430  BP: 130/70  Temp: 98.7 F (37.1 C)    Body mass index is 23.44 kg/(m^2).  GENERAL: vitals reviewed and listed above, alert, oriented, appears well hydrated and in no acute distress  HEENT: atraumatic, conjunttiva clear, no obvious abnormalities on inspection of external nose and ears  NECK: no obvious masses on inspection  LUNGS: clear to auscultation bilaterally, no wheezes, rales or rhonchi, good air movement  CV: HRRR, no peripheral edema  MS: moves all extremities without noticeable abnormality  PSYCH: pleasant and cooperative, no obvious depression or anxiety  ASSESSMENT AND PLAN:  Discussed the following assessment and plan:  Need for prophylactic vaccination and inoculation against influenza - Plan: Flu Vaccine QUAD 36+ mos PF IM (Fluarix)  Essential hypertension, benign - Plan: lisinopril (PRINIVIL,ZESTRIL) 10 MG tablet -discussed options, she wants  to try different medication, will try lisinopril after discussion risks and benefits  Hyperlipemia - Plan: pravastatin (PRAVACHOL) 20 MG tablet -starting pravastatin after discussion risks/benefits  Prediabetes -lifestyle recs  GERD (gastroesophageal reflux disease) - Plan: esomeprazole (NEXIUM) 40 MG capsule  -discussed lifestyle recs at length -advised to schedule mammogram -advised physical with pap and she agreed to schedule this -Patient advised to return or notify a doctor immediately if symptoms worsen or persist or new concerns arise.  Patient Instructions  -starting lisinopril for your blood pressure (stop your old blood pressure medication)  -start pravastatin for your  cholesterol  -for the prediabetes, cholesterol and blood pressure: We recommend the following healthy lifestyle measures: - eat a healthy diet consisting of lots of vegetables, fruits, beans, nuts, seeds, healthy meats such as white chicken and fish and whole grains.  - avoid fried foods, fast food, processed foods, sodas, red meet and other fattening foods.  - get a least 150 minutes of aerobic exercise per week.   -schedule mammogram  -follow up in end of November or Beginning of December for physical exam and follow up      Kriste Basque R.

## 2012-12-28 NOTE — Patient Instructions (Signed)
-  starting lisinopril for your blood pressure (stop your old blood pressure medication)  -start pravastatin for your cholesterol  -for the prediabetes, cholesterol and blood pressure: We recommend the following healthy lifestyle measures: - eat a healthy diet consisting of lots of vegetables, fruits, beans, nuts, seeds, healthy meats such as white chicken and fish and whole grains.  - avoid fried foods, fast food, processed foods, sodas, red meet and other fattening foods.  - get a least 150 minutes of aerobic exercise per week.   -schedule mammogram  -follow up in end of November or Beginning of December for physical exam and follow up

## 2013-02-26 ENCOUNTER — Other Ambulatory Visit (INDEPENDENT_AMBULATORY_CARE_PROVIDER_SITE_OTHER): Payer: BC Managed Care – PPO

## 2013-02-26 ENCOUNTER — Telehealth: Payer: Self-pay | Admitting: Family Medicine

## 2013-02-26 DIAGNOSIS — Z0000 Encounter for general adult medical examination without abnormal findings: Secondary | ICD-10-CM

## 2013-02-26 LAB — BASIC METABOLIC PANEL
BUN: 12 mg/dL (ref 6–23)
CO2: 23 mEq/L (ref 19–32)
Glucose, Bld: 82 mg/dL (ref 70–99)
Potassium: 3.5 mEq/L (ref 3.5–5.1)
Sodium: 139 mEq/L (ref 135–145)

## 2013-02-26 LAB — POCT URINALYSIS DIPSTICK
Bilirubin, UA: NEGATIVE
Glucose, UA: NEGATIVE
Spec Grav, UA: >=1.03
pH, UA: 5.5

## 2013-02-26 LAB — LIPID PANEL
HDL: 84.5 mg/dL (ref >39.00)
Total CHOL/HDL Ratio: 3
Triglycerides: 101 mg/dL (ref 0.0–149.0)
VLDL: 20.2 mg/dL (ref 0.0–40.0)

## 2013-02-26 LAB — LDL CHOLESTEROL, DIRECT: Direct LDL: 165 mg/dL

## 2013-02-26 NOTE — Addendum Note (Signed)
Addended by: Bonnye Fava on: 02/26/2013 12:02 PM   Modules accepted: Orders

## 2013-02-26 NOTE — Telephone Encounter (Signed)
Message copied by Terressa Koyanagi on Tue Feb 26, 2013 12:06 PM ------      Message from: Drusilla Kanner      Created: Tue Feb 26, 2013  9:42 AM      Regarding: PT Complaint: CPX Labs       I spoke with Ms. Mcclurg today who was quite upset that she fasted today for cpx labs but was told she could not have them done prior to her cpx due to your preference of not allowing  patients to do this. Ms. Rutan states that you did not clearly explain that to her and since she has fasted today prefers to have labs drawn since her appt for the cpx is on 03/05/13 at 2:30pm and pt is unable to fast all day.  I authorized the patient to have labs drawn this morning since you were not here to consult with about pt's concerns.            Thanks,      Tim Lair ------

## 2013-02-26 NOTE — Telephone Encounter (Signed)
Thanks for taking care of this - I may need to speak with the lab folks and give them a protocol to follow if this happens again. Spoke with patient. She was thankful for the explanation and was mainly confused why lab appt set up and by what the lab told her. She also didn't understand why urine was being done and only wanted lipids and kidney tesst. I cancelled other test - though urine already done.  I explained that I like to order labs at the physical for the following reasons:  1) Newer data shows there is no need to do labs fasting  2) I like to make sure after talking with her and doing the exam at the physical we order any needed labs based on what we find and don't order labs she may not want (such as the urine).  3) I like to discuss any labs or tests with the patient before we do them  She agreed with this and thanked me.

## 2013-03-01 NOTE — Progress Notes (Signed)
Quick Note:  Called and spoke with pt and pt is aware. ______ 

## 2013-03-05 ENCOUNTER — Encounter: Payer: BC Managed Care – PPO | Admitting: Family Medicine

## 2013-03-05 NOTE — Progress Notes (Signed)
error    This encounter was created in error - please disregard.

## 2013-04-02 ENCOUNTER — Other Ambulatory Visit (HOSPITAL_COMMUNITY)
Admission: RE | Admit: 2013-04-02 | Discharge: 2013-04-02 | Disposition: A | Discharge status: Home / Self Care | Payer: BC Managed Care – PPO | Source: Ambulatory Visit | Attending: Family Medicine | Admitting: Family Medicine

## 2013-04-02 ENCOUNTER — Ambulatory Visit (INDEPENDENT_AMBULATORY_CARE_PROVIDER_SITE_OTHER): Payer: BC Managed Care – PPO | Admitting: Family Medicine

## 2013-04-02 ENCOUNTER — Encounter: Payer: Self-pay | Admitting: Family Medicine

## 2013-04-02 VITALS — BP 138/80 | Temp 98.9°F | Ht 61.0 in | Wt 118.0 lb

## 2013-04-02 DIAGNOSIS — E785 Hyperlipidemia, unspecified: Secondary | ICD-10-CM

## 2013-04-02 DIAGNOSIS — I10 Essential (primary) hypertension: Secondary | ICD-10-CM

## 2013-04-02 DIAGNOSIS — G5602 Carpal tunnel syndrome, left upper limb: Secondary | ICD-10-CM

## 2013-04-02 DIAGNOSIS — Z1151 Encounter for screening for human papillomavirus (HPV): Secondary | ICD-10-CM | POA: Insufficient documentation

## 2013-04-02 DIAGNOSIS — Z01419 Encounter for gynecological examination (general) (routine) without abnormal findings: Secondary | ICD-10-CM | POA: Insufficient documentation

## 2013-04-02 DIAGNOSIS — Z0000 Encounter for general adult medical examination without abnormal findings: Secondary | ICD-10-CM

## 2013-04-02 DIAGNOSIS — G5600 Carpal tunnel syndrome, unspecified upper limb: Secondary | ICD-10-CM

## 2013-04-02 NOTE — Patient Instructions (Addendum)
Vitamin d 1000 IU units daily and 1200mg  calcium total daily (you probably get half of the calcium from your diet)  Schedule mammogram  Cock up brace for wrist pain  We recommend the following healthy lifestyle measures: - eat a healthy diet consisting of lots of vegetables, fruits, beans, nuts, seeds, healthy meats such as white chicken and fish and whole grains.  - avoid fried foods, fast food, processed foods, sodas, red meet and other fattening foods.  - get a least 150 minutes of aerobic exercise per week.   Please have your colonoscopy report sent to [place]  Follow up in  2 months for hand and arm pain or sooner if worsening

## 2013-04-02 NOTE — Progress Notes (Signed)
Pre visit review using our clinic review tool, if applicable. No additional management support is needed unless otherwise documented below in the visit note. 

## 2013-04-02 NOTE — Progress Notes (Signed)
Chief Complaint  Patient presents with  . Annual Exam    HPI:  Here for CPE:  L thumb and index, thenar eminence, and forearm pain -started a few weeks ago -worse at night -no weakness or numbness -a little tingling in this area at times repetitive motions at work  -Diet: no soda, cutting out fried foods  -Not vit d and calcium: no  -Exercise: 4 days per week - stair stepping  -Diabetes and Dyslipidemia Screening: done, on statin  -Hx of HTN: treated  -Vaccines: UTD  -pap history: 21 years ago, normal  -FDLMP: N/A postmenapausal  -sexual activity: yes, female partner, no new partners  -wants STI testing: no  -FH breast, colon or ovarian ca: see FH -had colonoscopy for rectal bleeding 7 years ago and reports was normal but had hemorrhoids -last mammo last year   -Alcohol, Tobacco, drug use: see social history  Review of Systems - Review of Systems  Constitutional: Negative for fever, weight loss and malaise/fatigue.  HENT: Negative for hearing loss.   Eyes: Negative for blurred vision, double vision and pain.  Respiratory: Negative for shortness of breath.   Cardiovascular: Negative for chest pain and leg swelling.  Gastrointestinal: Negative for diarrhea, constipation and blood in stool.  Genitourinary: Negative for dysuria.  Musculoskeletal: Negative for falls.  Skin: Negative for rash.  Neurological: Negative for dizziness.  Endo/Heme/Allergies: Does not bruise/bleed easily.  Psychiatric/Behavioral: Negative for depression.     Past Medical History  Diagnosis Date  . Hypertension   . Hypercholesteremia   . Acid reflux   . Blood in stool   . GERD (gastroesophageal reflux disease)   . Hypertension   . Hyperlipidemia   . Kidney stones     Past Surgical History  Procedure Laterality Date  . Lumpectomy right breast  2011    benign    Family History  Problem Relation Age of Onset  . Cancer Mother     breast cancer at age 18  . Stroke Mother    . Hypertension Mother   . Hypertension Father     History   Social History  . Marital Status: Married    Spouse Name: N/A    Number of Children: N/A  . Years of Education: N/A   Social History Main Topics  . Smoking status: Never Smoker   . Smokeless tobacco: None  . Alcohol Use: No  . Drug Use: No  . Sexual Activity:    Other Topics Concern  . None   Social History Narrative  . None    Current outpatient prescriptions:acetaminophen (TYLENOL) 500 MG tablet, Take 500 mg by mouth every 6 (six) hours as needed for pain., Disp: , Rfl: ;  albuterol (PROVENTIL HFA;VENTOLIN HFA) 108 (90 BASE) MCG/ACT inhaler, Inhale 1-2 puffs into the lungs every 6 (six) hours as needed for wheezing., Disp: 1 Inhaler, Rfl: 0;  lisinopril (PRINIVIL,ZESTRIL) 10 MG tablet, Take 1 tablet (10 mg total) by mouth daily., Disp: 30 tablet, Rfl: 1 pravastatin (PRAVACHOL) 20 MG tablet, Take 1 tablet (20 mg total) by mouth daily., Disp: 30 tablet, Rfl: 1  EXAM:  Filed Vitals:   04/02/13 1424  BP: 138/80  Temp: 98.9 F (37.2 C)   Body mass index is 22.31 kg/(m^2).   GENERAL: vitals reviewed and listed below, alert, oriented, appears well hydrated and in no acute distress  HEENT: head atraumatic, PERRLA, normal appearance of eyes, ears, nose and mouth. moist mucus membranes.  NECK: supple, no masses or lymphadenopathy  LUNGS: clear to auscultation bilaterally, no rales, rhonchi or wheeze  CV: HRRR, no peripheral edema or cyanosis, normal pedal pulses  BREAST: normal appearance - no lesions or discharge, on palpation normal breast tissue without any suspicious masses  ABDOMEN: bowel sounds normal, soft, non tender to palpation, no masses, no rebound or guarding  GU: normal appearance of external genitalia - no lesions or masses, normal vaginal mucosa - no abnormal discharge, normal appearance of cervix - no lesions or abnormal discharge, no masses or tenderness, pap obtained  RECTAL: few external  hemorrhoids small  SKIN: no rash or abnormal lesions  MS: normal gait, moves all extremities normally - normal ROM and strength and sensation in arms, wrists and hands, + phallens on L, neg tinels  NEURO: CN II-XII grossly intact, normal muscle strength and sensation to light touch on extremities  PSYCH: normal affect, pleasant and cooperative  ASSESSMENT AND PLAN:  Discussed the following assessment and plan:  Visit for preventive health examination - Plan: Cytology - PAP  CTS (carpal tunnel syndrome), left  Essential hypertension, benign  Other and unspecified hyperlipidemia   -Discussed and advised all [place] preventive services health task force level A and B recommendations for age, sex and risks.  -Advised at least 150 minutes of exercise per week and a healthy diet low in saturated fats and sweets and consisting of fresh fruits and vegetables, lean meats such as fish and white chicken and whole grains.  -labs, studies and vaccines per orders this encounter  No orders of the defined types were placed in this encounter.    Patient Instructions  Vitamin d 1000 IU units daily and 1200mg  calcium total daily (you probably get half of the calcium from your diet)  Schedule mammogram  Cock up brace for wrist pain  We recommend the following healthy lifestyle measures: - eat a healthy diet consisting of lots of vegetables, fruits, beans, nuts, seeds, healthy meats such as white chicken and fish and whole grains.  - avoid fried foods, fast food, processed foods, sodas, red meet and other fattening foods.  - get a least 150 minutes of aerobic exercise per week.   Please have your colonoscopy report sent to [place]  Follow up in  2 months for hand and arm pain or sooner if worsening    Patient advised to return to clinic immediately if symptoms worsen or persist or new concerns.  @LIFEPLAN @  No Follow-up on file.  Kriste Basque R.

## 2013-04-03 ENCOUNTER — Telehealth: Payer: Self-pay | Admitting: Family Medicine

## 2013-04-03 ENCOUNTER — Ambulatory Visit (INDEPENDENT_AMBULATORY_CARE_PROVIDER_SITE_OTHER)
Admission: RE | Admit: 2013-04-03 | Discharge: 2013-04-03 | Disposition: A | Discharge status: Home / Self Care | Payer: BC Managed Care – PPO | Source: Ambulatory Visit | Attending: Family Medicine | Admitting: Family Medicine

## 2013-04-03 DIAGNOSIS — M79609 Pain in unspecified limb: Secondary | ICD-10-CM

## 2013-04-03 DIAGNOSIS — M79646 Pain in unspecified finger(s): Secondary | ICD-10-CM

## 2013-04-03 NOTE — Telephone Encounter (Signed)
I am not sure what she means by this. Did she suffer trauma since yesterday? Pain was in soft tissues and not in joint yesterday. If symptoms have changed significantly we probably should re-eval as did not seem to be joint related yesterday. Could we work her in for Friday? If sig swelling, redness, etc we could work her in today at 11:15...or she should go to [facility].

## 2013-04-03 NOTE — Telephone Encounter (Signed)
Pt states it feels double jointed and she can feel it move and pop.  Pt has not had any trauma since yesterday.  Pt states it has done this before. Pt denies swelling, redness etc.  Pt states it does not feel like carpal tunnel in her thumb. Spoke with pt and pt is aware that per Dr. Selena Batten pt can have an xray of her hand done and if pt continues to have symptoms she may need to be referred to orthopedics.  Pt verbalized understanding and is aware that [facility] x[facility] is open until 2.

## 2013-04-03 NOTE — Telephone Encounter (Signed)
Pt states Dr. Selena Batten told her she has carpal tunnel in her thumb, today it is popping out of the joint. Pt wants to know if Dr. Selena Batten can order an x-ray for her. Please advise.

## 2013-04-16 ENCOUNTER — Other Ambulatory Visit: Payer: Self-pay | Admitting: Family Medicine

## 2013-04-18 ENCOUNTER — Encounter: Payer: Self-pay | Admitting: Family Medicine

## 2013-04-18 NOTE — Progress Notes (Signed)
Received office notes from [facility].  Assessment and Plan- left trigger finger.  Pt received injection in the office and will return in 6 weeks for a follow up.  Sent to scan.

## 2013-05-27 NOTE — Progress Notes (Signed)
Received office notes from [facility] from visit on 2.18.15.  Pt seen for follow up on left thumb trigger digit.  Pt states the injection was helpful for approx 3 weeks and then it began to recur again.  Pt received  Injection of Xylocaine and 1/2 cc Celestone.  Pt to be seen back in 6 weeks.  Sent to scan.

## 2013-06-04 ENCOUNTER — Ambulatory Visit (INDEPENDENT_AMBULATORY_CARE_PROVIDER_SITE_OTHER): Payer: BC Managed Care – PPO | Admitting: Family Medicine

## 2013-06-04 VITALS — BP 130/80 | Temp 98.9°F | Wt 117.0 lb

## 2013-06-04 DIAGNOSIS — I10 Essential (primary) hypertension: Secondary | ICD-10-CM

## 2013-06-04 DIAGNOSIS — F4321 Adjustment disorder with depressed mood: Secondary | ICD-10-CM

## 2013-06-04 DIAGNOSIS — K219 Gastro-esophageal reflux disease without esophagitis: Secondary | ICD-10-CM

## 2013-06-04 DIAGNOSIS — E785 Hyperlipidemia, unspecified: Secondary | ICD-10-CM

## 2013-06-04 MED ORDER — ESCITALOPRAM OXALATE 5 MG PO TABS: 5.0000 mg | ORAL_TABLET | Freq: Every day | ORAL | Status: DC

## 2013-06-04 NOTE — Progress Notes (Signed)
Pre visit review using our clinic review tool, if applicable. No additional management support is needed unless otherwise documented below in the visit note. 

## 2013-06-04 NOTE — Progress Notes (Signed)
Chief Complaint  Patient presents with  . Follow-up    thumb and bp concerns     HPI:  Follow up:  Stress: -mother is dying of sepsis -stressed and depressed mood -coping well and has great support but wants to try antidepressant -no SI  HTN: -stable -Meds: lisinpril -denies:  HLD: -on pravastatin -stable  GERD: -on nexium, stable  ROS: See pertinent positives and negatives per HPI.  Past Medical History  Diagnosis Date  . Hypertension   . Hypercholesteremia   . Acid reflux   . Blood in stool   . GERD (gastroesophageal reflux disease)   . Hypertension   . Hyperlipidemia   . Kidney stones     Past Surgical History  Procedure Laterality Date  . Lumpectomy right breast  2011    benign    Family History  Problem Relation Age of Onset  . Cancer Mother     breast cancer at age 37  . Stroke Mother   . Hypertension Mother   . Hypertension Father     History   Social History  . Marital Status: Married    Spouse Name: N/A    Number of Children: N/A  . Years of Education: N/A   Social History Main Topics  . Smoking status: Never Smoker   . Smokeless tobacco: Not on file  . Alcohol Use: No  . Drug Use: No  . Sexual Activity:    Other Topics Concern  . Not on file   Social History Narrative  . No narrative on file    Current outpatient prescriptions:acetaminophen (TYLENOL) 500 MG tablet, Take 500 mg by mouth every 6 (six) hours as needed for pain., Disp: , Rfl: ;  albuterol (PROVENTIL HFA;VENTOLIN HFA) 108 (90 BASE) MCG/ACT inhaler, Inhale 1-2 puffs into the lungs every 6 (six) hours as needed for wheezing., Disp: 1 Inhaler, Rfl: 0;  lisinopril (PRINIVIL,ZESTRIL) 10 MG tablet, TAKE 1 TABLET BY MOUTH EVERY DAY, Disp: 90 tablet, Rfl: 3 pravastatin (PRAVACHOL) 20 MG tablet, TAKE 1 TABLET BY MOUTH EVERY DAY, Disp: 90 tablet, Rfl: 3;  escitalopram (LEXAPRO) 5 MG tablet, Take 1 tablet (5 mg total) by mouth daily., Disp: 30 tablet, Rfl: 3  EXAM:  Filed  Vitals:   06/04/13 1341  BP: 130/80  Temp: 98.9 F (37.2 C)    Body mass index is 22.12 kg/(m^2).  GENERAL: vitals reviewed and listed above, alert, oriented, appears well hydrated and in no acute distress  HEENT: atraumatic, conjunttiva clear, no obvious abnormalities on inspection of external nose and ears  NECK: no obvious masses on inspection  LUNGS: clear to auscultation bilaterally, no wheezes, rales or rhonchi, good air movement  CV: HRRR, no peripheral edema  MS: moves all extremities without noticeable abnormality  PSYCH: pleasant and cooperative, no obvious depression or anxiety  ASSESSMENT AND PLAN:  Discussed the following assessment and plan:  Situational depression - Plan: escitalopram (LEXAPRO) 5 MG tablet  Essential hypertension, benign  Hyperlipemia  GERD (gastroesophageal reflux disease)  -starting low dose lexapro after discussion options for depression and risks/benefits, also mothe ris going to be in hospice so advised of bereavement counseling -needs labs, but she prefers to do these at follow up -follow up 1 month -Patient advised to return or notify a doctor immediately if symptoms worsen or persist or new concerns arise.  Patient Instructions  -start the lexapro 5 mg daily, consider counseling  -follow up in 1 month for a morning appointment for follow up and to  check your labs       Aletheia Tangredi, Dahlia ClientHANNAH R.

## 2013-06-04 NOTE — Patient Instructions (Signed)
-  start the lexapro 5 mg daily, consider counseling  -follow up in 1 month for a morning appointment for follow up and to check your labs

## 2013-06-05 ENCOUNTER — Telehealth: Payer: Self-pay | Admitting: Family Medicine

## 2013-06-05 NOTE — Telephone Encounter (Signed)
Relevant patient education mailed to patient.  

## 2013-07-11 ENCOUNTER — Telehealth: Payer: Self-pay | Admitting: Family Medicine

## 2013-07-11 ENCOUNTER — Ambulatory Visit (INDEPENDENT_AMBULATORY_CARE_PROVIDER_SITE_OTHER): Payer: Self-pay | Admitting: Family Medicine

## 2013-07-11 ENCOUNTER — Encounter: Payer: Self-pay | Admitting: Family Medicine

## 2013-07-11 VITALS — BP 140/80 | Temp 97.9°F | Wt 116.0 lb

## 2013-07-11 DIAGNOSIS — F4321 Adjustment disorder with depressed mood: Secondary | ICD-10-CM

## 2013-07-11 DIAGNOSIS — Z1211 Encounter for screening for malignant neoplasm of colon: Secondary | ICD-10-CM

## 2013-07-11 DIAGNOSIS — E785 Hyperlipidemia, unspecified: Secondary | ICD-10-CM

## 2013-07-11 DIAGNOSIS — I10 Essential (primary) hypertension: Secondary | ICD-10-CM

## 2013-07-11 DIAGNOSIS — Z1239 Encounter for other screening for malignant neoplasm of breast: Secondary | ICD-10-CM

## 2013-07-11 LAB — BASIC METABOLIC PANEL
BUN: 11 mg/dL (ref 6–23)
CALCIUM: 9 mg/dL (ref 8.4–10.5)
CO2: 26 mEq/L (ref 19–32)
CREATININE: 0.8 mg/dL (ref 0.4–1.2)
Chloride: 107 mEq/L (ref 96–112)
GFR: 104.77 mL/min (ref >60.00)
Glucose, Bld: 91 mg/dL (ref 70–99)
Potassium: 4 mEq/L (ref 3.5–5.1)
Sodium: 139 mEq/L (ref 135–145)

## 2013-07-11 LAB — LIPID PANEL
CHOL/HDL RATIO: 3
Cholesterol: 254 mg/dL — ABNORMAL HIGH (ref 0–200)
HDL: 87.6 mg/dL (ref >39.00)
LDL CALC: 147 mg/dL — AB (ref 0–99)
TRIGLYCERIDES: 97 mg/dL (ref 0.0–149.0)
VLDL: 19.4 mg/dL (ref 0.0–40.0)

## 2013-07-11 MED ORDER — ESCITALOPRAM OXALATE 5 MG PO TABS: 5.0000 mg | ORAL_TABLET | Freq: Every day | ORAL | Status: DC

## 2013-07-11 NOTE — Patient Instructions (Signed)
-  We have ordered labs or studies at this visit. It can take up to 1-2 weeks for results and processing. We will contact you with instructions IF your results are abnormal. Normal results will be released to [facility]. If you have not heard from us or can not find your results in [facility] in 2 weeks please contact our office.  -Call and get your colonoscopy report and send a copy to us  We recommend the following healthy lifestyle measures: - eat a healthy diet consisting of lots of vegetables, fruits, beans, nuts, seeds, healthy meats such as white chicken and fish and whole grains.  - avoid fried foods, fast food, processed foods, sodas, red meet and other fattening foods.  - get a least 150 minutes of aerobic exercise per week.   Schedule your mammogram  Follow up in: 3-4 months

## 2013-07-11 NOTE — Progress Notes (Signed)
Pre visit review using our clinic review tool, if applicable. No additional management support is needed unless otherwise documented below in the visit note. 

## 2013-07-11 NOTE — Progress Notes (Signed)
Chief Complaint  Patient presents with  . Follow-up    HPI:  Depression -started lexapro last visit, mother passed in March -she reports doing well on lexapro and wishes to continue   HTN, HLD: -needs labs -stable -exercising again now that mother passed, trying to eat healthy  Breast Cancer Screening: -not up to date  Colon cancer screening: -unsure of when needs next colonoscopy  ROS: See pertinent positives and negatives per HPI.  Past Medical History  Diagnosis Date  . Hypertension   . Hypercholesteremia   . Acid reflux   . Blood in stool   . GERD (gastroesophageal reflux disease)   . Hypertension   . Hyperlipidemia   . Kidney stones     Past Surgical History  Procedure Laterality Date  . Lumpectomy right breast  2011    benign    Family History  Problem Relation Age of Onset  . Cancer Mother     breast cancer at age 282  . Stroke Mother   . Hypertension Mother   . Hypertension Father     History   Social History  . Marital Status: Married    Spouse Name: N/A    Number of Children: N/A  . Years of Education: N/A   Social History Main Topics  . Smoking status: Never Smoker   . Smokeless tobacco: None  . Alcohol Use: No  . Drug Use: No  . Sexual Activity:    Other Topics Concern  . None   Social History Narrative  . None    Current outpatient prescriptions:acetaminophen (TYLENOL) 500 MG tablet, Take 500 mg by mouth every 6 (six) hours as needed for pain., Disp: , Rfl: ;  albuterol (PROVENTIL HFA;VENTOLIN HFA) 108 (90 BASE) MCG/ACT inhaler, Inhale 1-2 puffs into the lungs every 6 (six) hours as needed for wheezing., Disp: 1 Inhaler, Rfl: 0;  escitalopram (LEXAPRO) 5 MG tablet, Take 1 tablet (5 mg total) by mouth daily., Disp: 90 tablet, Rfl: 1 lisinopril (PRINIVIL,ZESTRIL) 10 MG tablet, TAKE 1 TABLET BY MOUTH EVERY DAY, Disp: 90 tablet, Rfl: 3;  pravastatin (PRAVACHOL) 20 MG tablet, TAKE 1 TABLET BY MOUTH EVERY DAY, Disp: 90 tablet, Rfl:  3  EXAM:  Filed Vitals:   07/11/13 0840  BP: 140/80  Temp: 97.9 F (36.6 C)    Body mass index is 21.93 kg/(m^2).  GENERAL: vitals reviewed and listed above, alert, oriented, appears well hydrated and in no acute distress  HEENT: atraumatic, conjunttiva clear, no obvious abnormalities on inspection of external nose and ears  NECK: no obvious masses on inspection  LUNGS: clear to auscultation bilaterally, no wheezes, rales or rhonchi, good air movement  CV: HRRR, no peripheral edema  MS: moves all extremities without noticeable abnormality  PSYCH: pleasant and cooperative, no obvious depression or anxiety  ASSESSMENT AND PLAN:  Discussed the following assessment and plan:  Situational depression - Plan: escitalopram (LEXAPRO) 5 MG tablet  Hypertension - Plan: Basic metabolic panel  Hyperlipemia - Plan: Lipid Panel  Breast cancer screening  Colon cancer screening  -Patient advised to return or notify a doctor immediately if symptoms worsen or persist or new concerns arise.  Patient Instructions  -We have ordered labs or studies at this visit. It can take up to 1-2 weeks for results and processing. We will contact you with instructions IF your results are abnormal. Normal results will be released to your [facility]. If you have not heard from us or can not find your results in [facility] in  2 weeks please contact our office.  -Call and get your colonoscopy report and send a copy to [place]  We recommend the following healthy lifestyle measures: - eat a healthy diet consisting of lots of vegetables, fruits, beans, nuts, seeds, healthy meats such as white chicken and fish and whole grains.  - avoid fried foods, fast food, processed foods, sodas, red meet and other fattening foods.  - get a least 150 minutes of aerobic exercise per week.   Schedule your mammogram  Follow up in: 3-4 months      Terressa Koyanagi

## 2013-07-11 NOTE — Telephone Encounter (Signed)
Relevant patient education assigned to patient using Emmi. ° °

## 2013-07-19 ENCOUNTER — Other Ambulatory Visit: Payer: Self-pay

## 2013-07-19 DIAGNOSIS — Z1231 Encounter for screening mammogram for malignant neoplasm of breast: Secondary | ICD-10-CM

## 2013-07-19 NOTE — Progress Notes (Signed)
Received office notes from [facility] from visit on 4.8.15.  Pt seen for follow up on left thumb trigger digit. Pt notes that triggering is coming back.  Discussed with pt and decided to have a release.  Pt would like to think about options and risks and benefits.  Pt will follow up with decision.  Sent to scan.

## 2013-07-30 ENCOUNTER — Ambulatory Visit: Payer: BC Managed Care – PPO

## 2013-08-08 ENCOUNTER — Encounter (HOSPITAL_COMMUNITY): Payer: Self-pay | Admitting: Emergency Medicine

## 2013-08-08 ENCOUNTER — Emergency Department (HOSPITAL_COMMUNITY)
Admission: EM | Admit: 2013-08-08 | Discharge: 2013-08-08 | Disposition: A | Discharge status: Home / Self Care | Payer: BC Managed Care – PPO | Attending: Emergency Medicine | Admitting: Emergency Medicine

## 2013-08-08 DIAGNOSIS — I10 Essential (primary) hypertension: Secondary | ICD-10-CM | POA: Insufficient documentation

## 2013-08-08 DIAGNOSIS — Z87442 Personal history of urinary calculi: Secondary | ICD-10-CM | POA: Insufficient documentation

## 2013-08-08 DIAGNOSIS — E7800 Pure hypercholesterolemia, unspecified: Secondary | ICD-10-CM | POA: Insufficient documentation

## 2013-08-08 DIAGNOSIS — Z79899 Other long term (current) drug therapy: Secondary | ICD-10-CM | POA: Insufficient documentation

## 2013-08-08 DIAGNOSIS — E785 Hyperlipidemia, unspecified: Secondary | ICD-10-CM | POA: Insufficient documentation

## 2013-08-08 DIAGNOSIS — Z8719 Personal history of other diseases of the digestive system: Secondary | ICD-10-CM | POA: Insufficient documentation

## 2013-08-08 DIAGNOSIS — J069 Acute upper respiratory infection, unspecified: Secondary | ICD-10-CM | POA: Insufficient documentation

## 2013-08-08 MED ORDER — DEXAMETHASONE SODIUM PHOSPHATE 10 MG/ML IJ SOLN
10.0000 mg | Freq: Once | INTRAMUSCULAR | Status: AC
  Administered 2013-08-08: 10 mg via INTRAMUSCULAR
  Filled 2013-08-08: qty 1

## 2013-08-08 MED ORDER — IBUPROFEN 800 MG PO TABS: 800.0000 mg | ORAL_TABLET | Freq: Three times a day (TID) | ORAL | Status: DC

## 2013-08-08 MED ORDER — GUAIFENESIN ER 600 MG PO TB12: 600.0000 mg | ORAL_TABLET | Freq: Two times a day (BID) | ORAL | Status: DC

## 2013-08-08 MED ORDER — SALINE SPRAY 0.65 % NA SOLN: 1.0000 | NASAL | Status: DC | PRN

## 2013-08-08 NOTE — ED Provider Notes (Signed)
CSN: [id number]     Arrival date & time 08/08/13  1002 History   First MD Initiated Contact with Patient 08/08/13 1017    This chart was scribed for Mellody DrownLauren Joniyah Mallinger PA-C, a non-physician practitioner working with Laray AngerKathleen M McManus, DO by Lewanda RifeAlexandra Hurtado, ED Scribe. This patient was seen in room TR09C/TR09C and the patient's care was started at 10:58 AM     Chief Complaint  Patient presents with  . Allergies     (Consider location/radiation/quality/duration/timing/severity/associated sxs/prior Treatment) The history is provided by the patient. No language interpreter was used.   HPI Comments: Alice Ryan is a 53 y.o. female who presents to the Emergency Department with PMHx HTN complaining of seasonal allergies onset chronic, but exacerbated in the last 2 days. Reports associated sore throat, rhinorrhea with yellow drainage, and nasal congestion. Pt states she usually needs "allergy shots, but is in between insurances". Reports symptoms are exacerbated by spring season. Reports trying benadryl, and Claritin with mild relief of symptoms. Denies associated sick contacts, fever,   Dr. Corinda GublerLeBauer is the allergist she used to see Past Medical History  Diagnosis Date  . Hypertension   . Hypercholesteremia   . Acid reflux   . Blood in stool   . GERD (gastroesophageal reflux disease)   . Hypertension   . Hyperlipidemia   . Kidney stones    Past Surgical History  Procedure Laterality Date  . Lumpectomy right breast  2011    benign   Family History  Problem Relation Age of Onset  . Cancer Mother     breast cancer at age 51  . Stroke Mother   . Hypertension Mother   . Hypertension Father    History  Substance Use Topics  . Smoking status: Never Smoker   . Smokeless tobacco: Not on file  . Alcohol Use: No   OB History   Grav Para Term Preterm Abortions TAB SAB Ect Mult Living                 Review of Systems  Constitutional: Negative for fever and chills.  HENT:  Positive for congestion, rhinorrhea and sore throat.   Eyes: Negative for itching.  Respiratory: Negative for cough and wheezing.   Gastrointestinal: Negative for abdominal pain.  Allergic/Immunologic: Positive for environmental allergies.  All other systems reviewed and are negative.     Allergies  Apple; Shellfish allergy; Flounder; and Peanut butter flavor  Home Medications   Prior to Admission medications   Medication Sig Start Date End Date Taking? Authorizing Provider  acetaminophen (TYLENOL) 500 MG tablet Take 500 mg by mouth every 6 (six) hours as needed for mild pain.    Yes Historical Provider, MD  lisinopril (PRINIVIL,ZESTRIL) 10 MG tablet Take 10 mg by mouth daily.   Yes Historical Provider, MD  pravastatin (PRAVACHOL) 20 MG tablet Take 20 mg by mouth daily.   Yes Historical Provider, MD  albuterol (PROVENTIL HFA;VENTOLIN HFA) 108 (90 BASE) MCG/ACT inhaler Inhale 1-2 puffs into the lungs every 6 (six) hours as needed for wheezing. 08/03/12   Reuben Likesavid C Keller, MD   BP 153/54  Pulse 72  Temp(Src) 98.3 F (36.8 C) (Oral)  Resp 18  SpO2 100%  LMP 06/04/2012 Physical Exam  Nursing note and vitals reviewed. Constitutional: She is oriented to person, place, and time. She appears well-developed and well-nourished. No distress.  HENT:  Head: Normocephalic and atraumatic.  Right Ear: Tympanic membrane, external ear and ear canal normal.  Left Ear: Tympanic  membrane, external ear and ear canal normal.  Nose: Rhinorrhea present. Right sinus exhibits no maxillary sinus tenderness and no frontal sinus tenderness. Left sinus exhibits no maxillary sinus tenderness and no frontal sinus tenderness.  Mouth/Throat: Uvula is midline and mucous membranes are normal. No trismus in the jaw. No dental abscesses. Posterior oropharyngeal erythema present. No oropharyngeal exudate or posterior oropharyngeal edema.  Eyes: Conjunctivae and EOM are normal. Pupils are equal, round, and reactive to  light. Right eye exhibits no discharge. Left eye exhibits no discharge.  Neck: Normal range of motion. Neck supple. No tracheal deviation present.  Cardiovascular: Normal rate.   Pulmonary/Chest: Effort normal. No respiratory distress. She has no wheezes. She has no rales. She exhibits no tenderness.  Musculoskeletal: Normal range of motion.  Lymphadenopathy:       Head (right side): No submental, no submandibular, no tonsillar, no preauricular, no posterior auricular and no occipital adenopathy present.       Head (left side): No submental, no submandibular, no tonsillar, no preauricular, no posterior auricular and no occipital adenopathy present.       Right cervical: No superficial cervical adenopathy present.      Left cervical: No superficial cervical adenopathy present.  Neurological: She is alert and oriented to person, place, and time.  Skin: Skin is warm and dry. She is not diaphoretic.  Psychiatric: She has a normal mood and affect. Her behavior is normal.    ED Course  Procedures (including critical care time) COORDINATION OF CARE:  Nursing notes reviewed. Vital signs reviewed. Initial pt interview and examination performed.   Filed Vitals:   08/08/13 1008  BP: 153/54  Pulse: 72  Temp: 98.3 F (36.8 C)  TempSrc: Oral  Resp: 18  SpO2: 100%     MDM   Final diagnoses:  URI (upper respiratory infection)   Patients symptoms are consistent with URI, likely viral etiology. Discussed that antibiotics are not indicated for viral infections. Pt will be discharged with symptomatic treatment.  Verbalizes understanding and is agreeable with plan. Pt is hemodynamically stable & in NAD prior to dc.  Meds given in ED:  Medications  dexamethasone (DECADRON) injection 10 mg (10 mg Intramuscular Given 08/08/13 1108)    Discharge Medication List as of 08/08/2013 11:22 AM    START taking these medications   Details  guaiFENesin (MUCINEX) 600 MG 12 hr tablet Take 1 tablet (600 mg  total) by mouth 2 (two) times daily., Starting 08/08/2013, Until Discontinued, Print    ibuprofen (ADVIL,MOTRIN) 800 MG tablet Take 1 tablet (800 mg total) by mouth 3 (three) times daily. Take with food, Starting 08/08/2013, Until Discontinued, Print    sodium chloride (OCEAN) 0.65 % SOLN nasal spray Place 1 spray into both nostrils as needed for congestion., Starting 08/08/2013, Until Discontinued, Print        I personally performed the services described in this documentation, which was scribed in my presence. The recorded information has been reviewed and is accurate.     Clabe SealLauren M Daniele Dillow, PA-C 08/09/13 [phone]

## 2013-08-08 NOTE — Discharge Instructions (Signed)
Call for a follow up appointment with a Family or Primary Care Provider.  Return if Symptoms worsen.   Take medication as prescribed.  You can use saline nasal spray.   Emergency Department Resource Guide 1) Find a Doctor and Pay Out of Pocket Although you won't have to find out who is covered by your insurance plan, it is a good idea to ask around and get recommendations. You will then need to call the office and see if the doctor you have chosen will accept you as a new patient and what types of options they offer for patients who are self-pay. Some doctors offer discounts or will set up payment plans for their patients who do not have insurance, but you will need to ask so you aren't surprised when you get to your appointment.  2) Contact Your Local Health Department Not all health departments have doctors that can see patients for sick visits, but many do, so it is worth a call to see if yours does. If you don't know where your local health department is, you can check in your phone book. The CDC also has a tool to help you locate your state's health department, and many state websites also have listings of all of their local health departments.  3) Find a Walk-in Clinic If your illness is not likely to be very severe or complicated, you may want to try a walk in clinic. These are popping up all over the country in pharmacies, drugstores, and shopping centers. They're usually staffed by nurse practitioners or physician assistants that have been trained to treat common illnesses and complaints. They're usually fairly quick and inexpensive. However, if you have serious medical issues or chronic medical problems, these are probably not your best option.  No Primary Care Doctor: - Call Health Connect at  [phone] - they can help you locate a primary care doctor that  accepts your insurance, provides certain services, etc. - Physician Referral Service- [phone]  Chronic Pain  Problems: Organization         Address  Phone   Notes  [facility]  [phone] Patients need to be referred by their primary care doctor.   Medication Assistance: Organization         Address  Phone   Notes  [facility] Ser[facility] Program [address] [place], [place] [address] [phone](336) [phone] --Must be a resident of [place] -- Must have NO insurance coverage whatsoever (no Medicaid/ Medicare, etc.) -- The pt. MUST have a primary care doctor that directs their care regularly and follows them in the community   MedAssist  [phone]) [phone]   [employer]  [phone]) [phone]    Agencies that provide inexpensive medical care: Organization         Address  Phone   Notes  [facility]  [phone]) [phone]   [facility]    [phone]) [phone]   [facility] [address] [place], [place] [address] [phone]   [facility] [address], [place] [phone]   [facility]anned [facility]    [phone]) [phone]   [facility]    [phone]   [facility]  [address], [place] Phone:  [phone](336) [phone], Fax:  [phone] Hours of Operation:  9 am - 6 pm, M-F.  Also accepts Medicaid/Medicare and self-pay.  [facility]  [address], [place] Phone: [phone], Fax: [phone]. Hours of Operation:  8:30 am - 5:30 pm, M-F.  Also accepts Medicaid and self-pay.  °[facility] [place] [address], [place] Phone: [phone]) [phone]   °[facility] [address], [place], [place] [phone], Ext. 123 Mondays & Thursdays: 7-9 AM.  First 15 patients are seen on a first come, first serve basis. °  ° °Medicaid-accepting [place] Providers: ° °Organization         Address  Phone   Notes  °[facility] [address], [place] [phone] Also  accepts self-pay patients.  °[facility] [address], [place] ° ([phone]) [phone]   °[facility] [address], [place] ([phone]) [phone]   °[facility] [address] [place] [address], [place] ([phone]) [phone]   °Veita Bland [address], [place]  ° [phone]) [phone] Only accepts [place] Access Medicaid patients after they have their name applied to their card.  ° °Self-Pay (no insurance) in [place]: ° °Organization         Address  Phone   Notes  °Sickle Cell Patients, [facility] [address], [place] [phone]   °[facility] [address], [place] ([phone]) [phone]   °[facility] ° [address], [place] [phone]) [phone]   °[facility]/Dr. Osei-Bonsu ° [address] [place] [address], [place] or [address], [place] [phone] Phone number for both [place] and [place] locations is the same.  °[facility] [address], [place] [phone]) [phone]   °[facility] [address] [place] [address], [place] or [address] [phone]) [phone] °([phone]) [phone]   °[facility] [address], [place] ([phone]) [phone], phone; [phone], fax Sees patients 1st and 3rd Saturday of every month.  Must not qualify for public or private insurance (i.e. Medicaid, Medicare, [place] Health Choice, Veterans' Benefits) • Household income should be no more than 200% of the poverty level •The clinic cannot treat you if you are pregnant or think you are pregnant • Sexually transmitted diseases are not treated at the clinic.  ° ° °Dental Care: °Organization         Address  Phone  Notes  °[place] [facility] [address], [place] [phone]) [phone] Accepts children up to age 21 who are enrolled in Medicaid or [place] Health Choice; pregnant  women with a Medicaid card; and children who have applied for Medicaid or [place] Health Choice, but were declined, whose parents can pay a reduced fee at time of service.  °[place] [facility] [place]  [address], [place] ([phone]) [phone] Accepts children up to age 21 who are enrolled in Medicaid or [place] Health Choice; pregnant women with a Medicaid card; and children who have applied for Medicaid or [place] Health Choice, but were declined, whose parents can pay a reduced fee at time of service.  °[facility] ° [address], [place] [phone]) [phone] Patients are seen by appointment only. Walk-ins are not accepted. [facility] will see patients 18 years of age and older. °Monday - Tuesday (8am-5pm) °Most Wednesdays (8:30-5pm) °$30 per visit, cash only  °[facility]   [facility]  [address], [facility] [phone] Patients are seen by appointment only. Walk-ins are not accepted. [place] will see patients 37 years of age and older. One Wednesday Evening (Monthly: Volunteer Based).  $30 per visit, cash only  [place]  [phone] for adults; Children under age 77, call Graduate Pediatric Dentistry at [phone]. Children aged 79-14, please call [phone] to request a pediatric application.  Dental services are provided in all areas of dental care including fillings, crowns and bridges, complete and partial dentures, implants, gum treatment, root canals, and extractions. Preventive care is also provided. Treatment is provided to both adults and children. Patients are selected via a lottery and there is often a waiting list.   [facility] [address], [place]  [phone] [email or web address]   [facility] [address] [place], [place] [phone], Ext. 123 Second and Fourth Thursday of each month, opens at 6:30 AM; Clinic ends at 9 AM.  Patients are  seen on a first-come first-served basis, and a limited number are seen during each clinic.   [facility]  [address] [place]ard Danker [place], [place] [phone]   Eligibility Requirements You must have lived in [place], [place], or [place] counties for at least the last three months.   You cannot be eligible for state or federal sponsored [employer], including [employer], [place], or [employer].   You generally cannot be eligible for healthcare insurance through your employer.    How to apply: Eligibility screenings are held every Tuesday and Wednesday afternoon from 1:00 pm until 4:00 pm. You do not need an appointment for the interview!  [facility] [address], [place], [place]   [place]  [place] Department  [place]  [phone]    Behavioral Health Resources in the Community: Intensive Outpatient Programs Organization         Address  Phone  Notes  [facility] [place]. [address] [place], [place] [phone]   [facility] [address], [place], [place]   [facility]: [facility] [address] [place], [place]   [place] [address],  [place], [place] or [phone]   Substance Abuse Resources Organization         Address  Phone  Notes  [facility] Drug Services  [phone]   [employer]  [phone]) [phone]   [facility] [place]   Chinita Pester  [phone]   Residential & Outpatient Substance Abuse Program  [phone]   Psychological Services Organization         Address  Phone  Notes  [facility] [place]  [place]  [phone]   [place] [address] [place] [phone] or [phone]    Mobile Crisis  Teams Organization         Address  Phone  Notes  [facility], Mobile Crisis Care Unit  [phone]   [facility]  [address] [place], [place]   [place] [address], [place] [phone]    Self-Help/Support Groups Organization         Address  Phone             Notes  Mental Health  Assoc[facility] of [place] - variety of support groups  [phone]- [phone] Call for more information  Narcotics Anonymous (NA), [facility] [address], [place]  2 meetings at this location   Statisticianesidential Treatment Programs Organization         Address  Phone  Notes  [facility] [address],    [place]  [phone]   [facility]  [address], [place] [phone], [place], [place] [phone]   [facility] [address], [place] [phone] Admissions: 8am-3pm M-F  [facility] [address]    [place], [place] [phone]   [facility] [address], [place], [place] [phone]   [facility] [address]  [place], [place] [phone]   [facility] - Intensive Outpatient [address], [place], [place] [phone]   [facility] [address]  [place], [place] [phone] or [phone]   [facility] ([facility]) [address] [place], [place] [phone] Accepts Medicaid  [facility] [address]  [place] [phone] Substance Abuse/Addiction Treatment   [facility] Behavioral Health Resources Organization         Address  Phone  Notes  [facility]  [phone]) [phone]   Angie FavaJulie Brannon, PhD [address], [address] [place], [place]   [phone]) [phone] or [phone](336) [phone]   [facility]   [address] [place], [place] [phone](336) [phone]   [facility] [address], [place], [place] [phone](336) [phone]  Insurance/Medicaid/sponsorship through [facility]  [facility] [address]                                    [place], [place] [phone]) [phone] Therapy/tele-psych/case  [facility] [address].   [place], [place] [phone]) [phone]    Dr. Lolly MustacheArfeen  [phone](336) [phone]   [facility] [place] [place]  [employer] [facility] 1) [address], [place] 2) [address], [place] 3)  [address], [place] [phone](336) [phone]  [phone](336) [phone]   [facility] Child Abuse Hotline (769)030-4499(336) 260-741-7045 or [phone](336) [phone] (After Hours)

## 2013-08-08 NOTE — ED Notes (Signed)
Pt c/o nasal congestion, runny nose and throat irritation. States had allergy testing 2 years ago. States "ususally goes to Urgent Care and gets steroid shots and abx". Above sx onset 2 days ago.

## 2013-08-08 NOTE — ED Notes (Signed)
Pt reports seasonal allergies taking claritin D, benadryl and not helping. States she usually needs allergy shot but is in between insurances.

## 2013-08-09 NOTE — ED Provider Notes (Signed)
Medical screening examination/treatment/procedure(s) were performed by non-physician practitioner and as supervising physician I was immediately available for consultation/collaboration.   EKG Interpretation None        Lasheika Ortloff M Siren Porrata, DO 08/09/13 2041 

## 2013-08-16 ENCOUNTER — Emergency Department (HOSPITAL_COMMUNITY)
Admission: EM | Admit: 2013-08-16 | Discharge: 2013-08-16 | Disposition: A | Discharge status: Home / Self Care | Payer: BC Managed Care – PPO | Attending: Emergency Medicine | Admitting: Emergency Medicine

## 2013-08-16 ENCOUNTER — Encounter (HOSPITAL_COMMUNITY): Payer: Self-pay | Admitting: Emergency Medicine

## 2013-08-16 DIAGNOSIS — L255 Unspecified contact dermatitis due to plants, except food: Secondary | ICD-10-CM | POA: Insufficient documentation

## 2013-08-16 DIAGNOSIS — B009 Herpesviral infection, unspecified: Secondary | ICD-10-CM | POA: Insufficient documentation

## 2013-08-16 DIAGNOSIS — L237 Allergic contact dermatitis due to plants, except food: Secondary | ICD-10-CM

## 2013-08-16 DIAGNOSIS — E785 Hyperlipidemia, unspecified: Secondary | ICD-10-CM | POA: Insufficient documentation

## 2013-08-16 DIAGNOSIS — B001 Herpesviral vesicular dermatitis: Secondary | ICD-10-CM

## 2013-08-16 DIAGNOSIS — I10 Essential (primary) hypertension: Secondary | ICD-10-CM | POA: Insufficient documentation

## 2013-08-16 DIAGNOSIS — Z8719 Personal history of other diseases of the digestive system: Secondary | ICD-10-CM | POA: Insufficient documentation

## 2013-08-16 DIAGNOSIS — Z79899 Other long term (current) drug therapy: Secondary | ICD-10-CM | POA: Insufficient documentation

## 2013-08-16 DIAGNOSIS — Z87442 Personal history of urinary calculi: Secondary | ICD-10-CM | POA: Insufficient documentation

## 2013-08-16 MED ORDER — PREDNISONE 20 MG PO TABS
ORAL_TABLET | ORAL | Status: DC
Start: 2013-08-16 — End: 2014-07-10

## 2013-08-16 MED ORDER — ACYCLOVIR 400 MG PO TABS: 400.0000 mg | ORAL_TABLET | Freq: Four times a day (QID) | ORAL | Status: DC

## 2013-08-16 MED ORDER — CEPHALEXIN 500 MG PO CAPS: 500.0000 mg | ORAL_CAPSULE | Freq: Four times a day (QID) | ORAL | Status: DC

## 2013-08-16 MED ORDER — MUPIROCIN CALCIUM 2 % EX CREA
1.0000 | TOPICAL_CREAM | Freq: Two times a day (BID) | CUTANEOUS | Status: DC
Start: 2013-08-16 — End: 2014-07-10

## 2013-08-16 NOTE — ED Notes (Signed)
Rash on her rt neck since Wednesday maybe started with an insect bite.  Red and inflamned and maybe had some poison ivy

## 2013-08-16 NOTE — ED Provider Notes (Signed)
CSN: [id number]     Arrival date & time 08/16/13  1519 History  This chart was scribed for non-physician practitioner, Arthor CaptainAbigail Tishie Altmann, PA-C working with Doug SouSam Jacubowitz, MD by Greggory StallionKayla Andersen, ED scribe. This patient was seen in room TR08C/TR08C and the patient's care was started at 3:39 PM.   Chief Complaint  Patient presents with  . Rash   The history is provided by the patient. No language interpreter was used.   HPI Comments: Alice Ryan is a 53 y.o. female who presents to the Emergency Department complaining of a rash to the right side of her neck that started 2 days ago. She has tried calamine lotion and rubbing alcohol with no relief. Pt had a similar rash one year ago and states it was poison ivy with overlying cellulitis. She states that she also gets blisters if she is out in the sun for too long. Denies fever.   Past Medical History  Diagnosis Date  . Hypertension   . Hypercholesteremia   . Acid reflux   . Blood in stool   . GERD (gastroesophageal reflux disease)   . Hypertension   . Hyperlipidemia   . Kidney stones    Past Surgical History  Procedure Laterality Date  . Lumpectomy right breast  2011    benign   Family History  Problem Relation Age of Onset  . Cancer Mother     breast cancer at age 51  . Stroke Mother   . Hypertension Mother   . Hypertension Father    History  Substance Use Topics  . Smoking status: Never Smoker   . Smokeless tobacco: Not on file  . Alcohol Use: No   OB History   Grav Para Term Preterm Abortions TAB SAB Ect Mult Living                 Review of Systems  Constitutional: Negative for fever.  HENT: Negative for congestion.   Eyes: Negative for redness.  Respiratory: Negative for shortness of breath.   Cardiovascular: Negative for chest pain.  Gastrointestinal: Negative for abdominal distention.  Musculoskeletal: Negative for gait problem.  Skin: Positive for rash.  Neurological: Negative for speech difficulty.   Psychiatric/Behavioral: Negative for confusion.   Allergies  Apple; Shellfish allergy; Flounder; and Peanut butter flavor  Home Medications   Prior to Admission medications   Medication Sig Start Date End Date Taking? Authorizing Provider  acetaminophen (TYLENOL) 500 MG tablet Take 500 mg by mouth every 6 (six) hours as needed for mild pain.     Historical Provider, MD  albuterol (PROVENTIL HFA;VENTOLIN HFA) 108 (90 BASE) MCG/ACT inhaler Inhale 1-2 puffs into the lungs every 6 (six) hours as needed for wheezing. 08/03/12   Reuben Likesavid C Keller, MD  guaiFENesin (MUCINEX) 600 MG 12 hr tablet Take 1 tablet (600 mg total) by mouth 2 (two) times daily. 08/08/13   Lauren Doretha ImusM Parker, PA-C  ibuprofen (ADVIL,MOTRIN) 800 MG tablet Take 1 tablet (800 mg total) by mouth 3 (three) times daily. Take with food 08/08/13   Clabe SealLauren M Parker, PA-C  lisinopril (PRINIVIL,ZESTRIL) 10 MG tablet Take 10 mg by mouth daily.    Historical Provider, MD  pravastatin (PRAVACHOL) 20 MG tablet Take 20 mg by mouth daily.    Historical Provider, MD  sodium chloride (OCEAN) 0.65 % SOLN nasal spray Place 1 spray into both nostrils as needed for congestion. 08/08/13   Lauren Doretha ImusM Parker, PA-C   BP 154/86  Pulse 72  Temp(Src) 99 F (  37.2 C) (Oral)  Resp 18  SpO2 98%  LMP 06/04/2012  Physical Exam  Nursing note and vitals reviewed. Constitutional: She is oriented to person, place, and time. She appears well-developed and well-nourished. No distress.  HENT:  Head: Normocephalic and atraumatic.  Eyes: EOM are normal.  Neck: Neck supple. No tracheal deviation present.  Cardiovascular: Normal rate.   Pulmonary/Chest: Effort normal. No respiratory distress.  Musculoskeletal: Normal range of motion.  Neurological: She is alert and oriented to person, place, and time.  Skin: Skin is warm and dry.  Right neck with weeping, crusting, oozing, confluent eruption of the right neck consistent with poison ivy dermatitis. No signs of secondary  infection.  0.5 cm area of confluent vesicles on the labial border of the R upper lip consistent with herpes labialis.  Psychiatric: She has a normal mood and affect. Her behavior is normal.    ED Course  Procedures (including critical care time)  DIAGNOSTIC STUDIES: Oxygen Saturation is 98% on RA, normal by my interpretation.    COORDINATION OF CARE: 3:48 PM-Discussed treatment plan which includes an antibiotic and oral steroid with pt at bedside and pt agreed to plan. Return precautions given.   Labs Review Labs Reviewed - No data to display  Imaging Review No results found.   EKG Interpretation None      MDM   Final diagnoses:  Poison ivy dermatitis  Herpes labialis   Patient with obvious severe poison ivy dermatitis of the neck and beninig to erupt on the face. ?  Secondary infection due to heat and tenderness. Will treat with 2 week steroid taper and topical mupirocin per current IDSA guidelines for SSTI. I will provide patient with keflex to hold the rx and begin if she has worsening signs of infection such as heat, redness, swelling, pain, streaking, chills, fever, myalgias, malaise. Patient also has small cold sore on the lip and asks for treatment. Discussed return precautions.  I personally performed the services described in this documentation, which was scribed in my presence. The recorded information has been reviewed and is accurate.  Arthor CaptainAbigail Jae Bruck, PA-C 08/18/13 [phone]

## 2013-08-16 NOTE — Discharge Instructions (Signed)
Poison [employer] Poison ivy is a inflammation of the skin (contact dermatitis) caused by touching the allergens on the leaves of the ivy plant following previous exposure to the plant. The rash usually appears 48 hours after exposure. The rash is usually bumps (papules) or blisters (vesicles) in a linear pattern. Depending on your own sensitivity, the rash may simply cause redness and itching, or it may also progress to blisters which may break open. These must be well cared for to prevent secondary bacterial (germ) infection, followed by scarring. Keep any open areas dry, clean, dressed, and covered with an antibacterial ointment if needed. The eyes may also get puffy. The puffiness is worst in the morning and gets better as the day progresses. This dermatitis usually heals without scarring, within 2 to 3 weeks without treatment. HOME CARE INSTRUCTIONS  Thoroughly wash with soap and water as soon as you have been exposed to poison ivy. You have about one half hour to remove the plant resin before it will cause the rash. This washing will destroy the oil or antigen on the skin that is causing, or will cause, the rash. Be sure to wash under your fingernails as any plant resin there will continue to spread the rash. Do not rub skin vigorously when washing affected area. Poison ivy cannot spread if no oil from the plant remains on your body. A rash that has progressed to weeping sores will not spread the rash unless you have not washed thoroughly. It is also important to wash any clothes you have been wearing as these may carry active allergens. The rash will return if you wear the unwashed clothing, even several days later. Avoidance of the plant in the future is the best measure. Poison ivy plant can be recognized by the number of leaves. Generally, poison ivy has three leaves with flowering branches on a single stem. Diphenhydramine may be purchased over the counter and used as needed for itching. Do not drive with  this medication if it makes you drowsy.Ask your caregiver about medication for children. SEEK MEDICAL CARE IF:  Open sores develop.  Redness spreads beyond area of rash.  You notice purulent (pus-like) discharge.  You have increased pain.  Other signs of infection develop (such as fever). Document Released: 03/18/2000 Document Revised: 06/13/2011 Document Reviewed: 02/04/2009 [facility]Exit[facility] Patient Information 2014 [place], [place].  Herpes Labialis You have a fever blister or cold sore (herpes labialis). These painful, grouped sores are caused by one of the herpes viruses (HSV1 most commonly). They are usually found around the lips and mouth, but the same infection can also affect other areas on the face such as the nose and eyes. Herpes infections take about 10 days to heal. They often occur again and again in the same spot. Other symptoms may include numbness and tingling in the involved skin, achiness, fever, and swollen glands in the neck. Colds, emotional stress, injuries, or excess sunlight exposure all seem to make herpes reappear. Herpes lip infections are contagious. Direct contact with these sores can spread the infection. It can also be spread to other parts of your own body. TREATMENT  Herpes labialis is usually self-limited and resolves within 1 week. To reduce pain and swelling, apply ice packs frequently to the sores or suck on popsicles or frozen juice bars. Antiviral medicine may be used by mouth to shorten the duration of the breakout. Avoid spreading the infection by washing your hands often. Be careful not to touch your eyes or genital areas after  handling the infected blisters. Do not kiss or have other intimate contact with others. After the blisters are completely healed you may resume contact. Use sunscreen to lessen recurrences.  If this is your first infection with herpes, or if you have a severe or repeated infections, your caregiver may prescribe one of the anti-viral drugs  to speed up the healing. If you have sun-related flare-ups despite the use of sunscreen, starting oral anti-viral medicine before a prolonged exposure (going skiing or to the beach) can prevent most episodes.  SEEK IMMEDIATE MEDICAL CARE IF:  You develop a headache, sleepiness, high fever, vomiting, or severe weakness.  You have eye irritation, pain, blurred vision or redness.  You develop a prolonged infection not getting better in 10 days. Document Released: 03/21/2005 Document Revised: 06/13/2011 Document Reviewed: 01/23/2009 [facility]Exit[facility] Patient Information 2014 [place], [place].

## 2013-08-19 NOTE — ED Provider Notes (Signed)
Medical screening examination/treatment/procedure(s) were performed by non-physician practitioner and as supervising physician I was immediately available for consultation/collaboration.   EKG Interpretation None       Doug SouSam Josely Moffat, MD 08/19/13 [phone]

## 2013-11-26 ENCOUNTER — Ambulatory Visit: Payer: BC Managed Care – PPO | Admitting: Family Medicine

## 2014-04-26 ENCOUNTER — Other Ambulatory Visit: Payer: Self-pay | Admitting: Family Medicine

## 2014-07-10 ENCOUNTER — Encounter: Payer: Self-pay | Admitting: Family Medicine

## 2014-07-10 ENCOUNTER — Ambulatory Visit (INDEPENDENT_AMBULATORY_CARE_PROVIDER_SITE_OTHER): Payer: 59 | Admitting: Family Medicine

## 2014-07-10 VITALS — BP 132/90 | HR 60 | Temp 97.5°F | Ht 61.0 in | Wt 133.6 lb

## 2014-07-10 DIAGNOSIS — J309 Allergic rhinitis, unspecified: Secondary | ICD-10-CM | POA: Diagnosis not present

## 2014-07-10 DIAGNOSIS — I10 Essential (primary) hypertension: Secondary | ICD-10-CM

## 2014-07-10 MED ORDER — LISINOPRIL 10 MG PO TABS: 10.0000 mg | ORAL_TABLET | Freq: Every day | ORAL | Status: DC

## 2014-07-10 MED ORDER — MOMETASONE FUROATE 50 MCG/ACT NA SUSP: 2.0000 | Freq: Every day | NASAL | Status: DC

## 2014-07-10 NOTE — Progress Notes (Signed)
HPI:  Allergic Rhinitis: -worsening -reports sees allergist, has had allergy testing, got allergy shots in the past -symptoms started a few weeks ago -symptoms: nasal congestion, sneezing, PND, itchy eyes -denies: SOB, wheezing, fevers, NVD -taking allegra daily  HTN: -meds: lisinopril -did not take medication this morning -denies: CP, SOB, DOE -needs refills  ROS: See pertinent positives and negatives per HPI.  Past Medical History  Diagnosis Date  . Hypertension   . Hypercholesteremia   . Acid reflux   . Blood in stool   . GERD (gastroesophageal reflux disease)   . Hypertension   . Hyperlipidemia   . Kidney stones     Past Surgical History  Procedure Laterality Date  . Lumpectomy right breast  2011    benign    Family History  Problem Relation Age of Onset  . Cancer Mother     breast cancer at age 32  . Stroke Mother   . Hypertension Mother   . Hypertension Father     History   Social History  . Marital Status: Married    Spouse Name: N/A  . Number of Children: N/A  . Years of Education: N/A   Social History Main Topics  . Smoking status: Never Smoker   . Smokeless tobacco: Not on file  . Alcohol Use: No  . Drug Use: No  . Sexual Activity: Not on file   Other Topics Concern  . None   Social History Narrative     Current outpatient prescriptions:  .  acetaminophen (TYLENOL) 500 MG tablet, Take 500 mg by mouth every 6 (six) hours as needed for mild pain. , Disp: , Rfl:  .  acyclovir (ZOVIRAX) 400 MG tablet, Take 1 tablet (400 mg total) by mouth 4 (four) times daily., Disp: 50 tablet, Rfl: 0 .  albuterol (PROVENTIL HFA;VENTOLIN HFA) 108 (90 BASE) MCG/ACT inhaler, Inhale 1-2 puffs into the lungs every 6 (six) hours as needed for wheezing., Disp: 1 Inhaler, Rfl: 0 .  guaiFENesin (MUCINEX) 600 MG 12 hr tablet, Take 1 tablet (600 mg total) by mouth 2 (two) times daily., Disp: 14 tablet, Rfl: 0 .  ibuprofen (ADVIL,MOTRIN) 800 MG tablet, Take 1  tablet (800 mg total) by mouth 3 (three) times daily. Take with food, Disp: 21 tablet, Rfl: 0 .  lisinopril (PRINIVIL,ZESTRIL) 10 MG tablet, Take 1 tablet (10 mg total) by mouth daily., Disp: 90 tablet, Rfl: 3 .  sodium chloride (OCEAN) 0.65 % SOLN nasal spray, Place 1 spray into both nostrils as needed for congestion., Disp: 15 mL, Rfl: 0 .  mometasone (NASONEX) 50 MCG/ACT nasal spray, Place 2 sprays into the nose daily., Disp: 17 g, Rfl: 6  EXAM:  Filed Vitals:   07/10/14 0802  BP: 132/90  Pulse: 60  Temp: 97.5 F (36.4 C)    Body mass index is 25.26 kg/(m^2).  GENERAL: vitals reviewed and listed above, alert, oriented, appears well hydrated and in no acute distress  HEENT: atraumatic, conjunttiva clear, no obvious abnormalities on inspection of external nose and ears, normal appearance of ear canals and TMs, clear nasal congestion, boggy turbinates, mild post oropharyngeal erythema with PND, no tonsillar edema or exudate, no sinus TTP  NECK: no obvious masses on inspection  LUNGS: clear to auscultation bilaterally, no wheezes, rales or rhonchi, good air movement  CV: HRRR, no peripheral edema  MS: moves all extremities without noticeable abnormality  PSYCH: pleasant and cooperative, no obvious depression or anxiety  ASSESSMENT AND PLAN:  Discussed the following  assessment and plan:  Allergic rhinitis, unspecified allergic rhinitis type - Plan: mometasone (NASONEX) 50 MCG/ACT nasal spray  Essential hypertension  -start INS and antihistamine daily -discussed avoidance of allergens - see instructions -refilled BP meds -discussed colon ca screening, sher refused colonoscopy but opted to do stool cards -advised mammo, she declined -offered HIV screening, declined -Patient advised to return or notify a doctor immediately if symptoms worsen or persist or new concerns arise.  Patient Instructions  Please start flonase or nasonex 2 sprays every for 1 month, and then 1 spray  each nostril daily  Zyrtec daily at night  Follow up with your allergist if not improving  HEPA filter vacuum - vacuum frequently  Good filter for HVAC and change at least every 2 months  Dust mite covers for pillow and bedding - wash bedding freqently  Schedule your mammogram     Alice Ryan, Alice Minahan R.

## 2014-07-10 NOTE — Patient Instructions (Addendum)
Please start flonase or nasonex 2 sprays every for 1 month, and then 1 spray each nostril daily  Zyrtec daily at night  Follow up with your allergist if not improving  HEPA filter vacuum - vacuum frequently  Good filter for HVAC and change at least every 2 months  Dust mite covers for pillow and bedding - wash bedding freqently  Schedule your mammogram

## 2014-07-10 NOTE — Progress Notes (Signed)
Pre visit review using our clinic review tool, if applicable. No additional management support is needed unless otherwise documented below in the visit note. 

## 2014-07-29 ENCOUNTER — Encounter (HOSPITAL_COMMUNITY): Payer: Self-pay | Admitting: Emergency Medicine

## 2014-07-29 ENCOUNTER — Emergency Department (INDEPENDENT_AMBULATORY_CARE_PROVIDER_SITE_OTHER)
Admission: EM | Admit: 2014-07-29 | Discharge: 2014-07-29 | Disposition: A | Discharge status: Home / Self Care | Payer: Self-pay | Source: Home / Self Care | Attending: Family Medicine | Admitting: Family Medicine

## 2014-07-29 DIAGNOSIS — R51 Headache: Secondary | ICD-10-CM

## 2014-07-29 DIAGNOSIS — R519 Headache, unspecified: Secondary | ICD-10-CM
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
## 2014-07-29 NOTE — Discharge Instructions (Signed)
Thank you for coming in today.  USE Tylenol or Aleve as needed for headache. Go to the emergency room if your headache becomes excruciating or you have weakness or numbness or uncontrolled vomiting.   Concussion A concussion, or closed-head injury, is a brain injury caused by a direct blow to the head or by a quick and sudden movement (jolt) of the head or neck. Concussions are usually not life-threatening. Even so, the effects of a concussion can be serious. If you have had a concussion before, you are more likely to experience concussion-like symptoms after a direct blow to the head.  CAUSES  Direct blow to the head, such as from running into another player during a soccer game, being hit in a fight, or hitting your head on a hard surface.  A jolt of the head or neck that causes the brain to move back and forth inside the skull, such as in a car crash. SIGNS AND SYMPTOMS The signs of a concussion can be hard to notice. Early on, they may be missed by you, family members, and health care providers. You may look fine but act or feel differently. Symptoms are usually temporary, but they may last for days, weeks, or even longer. Some symptoms may appear right away while others may not show up for hours or days. Every head injury is different. Symptoms include:  Mild to moderate headaches that will not go away.  A feeling of pressure inside your head.  Having more trouble than usual:  Learning or remembering things you have heard.  Answering questions.  Paying attention or concentrating.  Organizing daily tasks.  Making decisions and solving problems.  Slowness in thinking, acting or reacting, speaking, or reading.  Getting lost or being easily confused.  Feeling tired all the time or lacking energy (fatigued).  Feeling drowsy.  Sleep disturbances.  Sleeping more than usual.  Sleeping less than usual.  Trouble falling asleep.  Trouble sleeping (insomnia).  Loss of  balance or feeling lightheaded or dizzy.  Nausea or vomiting.  Numbness or tingling.  Increased sensitivity to:  Sounds.  Lights.  Distractions.  Vision problems or eyes that tire easily.  Diminished sense of taste or smell.  Ringing in the ears.  Mood changes such as feeling sad or anxious.  Becoming easily irritated or angry for little or no reason.  Lack of motivation.  Seeing or hearing things other people do not see or hear (hallucinations). DIAGNOSIS Your health care provider can usually diagnose a concussion based on a description of your injury and symptoms. He or she will ask whether you passed out (lost consciousness) and whether you are having trouble remembering events that happened right before and during your injury. Your evaluation might include:  A brain scan to look for signs of injury to the brain. Even if the test shows no injury, you may still have a concussion.  Blood tests to be sure other problems are not present. TREATMENT  Concussions are usually treated in an emergency department, in urgent care, or at a clinic. You may need to stay in the hospital overnight for further treatment.  Tell your health care provider if you are taking any medicines, including prescription medicines, over-the-counter medicines, and natural remedies. Some medicines, such as blood thinners (anticoagulants) and aspirin, may increase the chance of complications. Also tell your health care provider whether you have had alcohol or are taking illegal drugs. This information may affect treatment.  Your health care provider will send  you home with important instructions to follow.  How fast you will recover from a concussion depends on many factors. These factors include how severe your concussion is, what part of your brain was injured, your age, and how healthy you were before the concussion.  Most people with mild injuries recover fully. Recovery can take time. In general,  recovery is slower in older persons. Also, persons who have had a concussion in the past or have other medical problems may find that it takes longer to recover from their current injury. HOME CARE INSTRUCTIONS General Instructions  Carefully follow the directions your health care provider gave you.  Only take over-the-counter or prescription medicines for pain, discomfort, or fever as directed by your health care provider.  Take only those medicines that your health care provider has approved.  Do not drink alcohol until your health care provider says you are well enough to do so. Alcohol and certain other drugs may slow your recovery and can put you at risk of further injury.  If it is harder than usual to remember things, write them down.  If you are easily distracted, try to do one thing at a time. For example, do not try to watch TV while fixing dinner.  Talk with family members or close friends when making important decisions.  Keep all follow-up appointments. Repeated evaluation of your symptoms is recommended for your recovery.  Watch your symptoms and tell others to do the same. Complications sometimes occur after a concussion. Older adults with a brain injury may have a higher risk of serious complications, such as a blood clot on the brain.  Tell your teachers, school nurse, school counselor, coach, athletic trainer, or work Freight forwarder about your injury, symptoms, and restrictions. Tell them about what you can or cannot do. They should watch for:  Increased problems with attention or concentration.  Increased difficulty remembering or learning new information.  Increased time needed to complete tasks or assignments.  Increased irritability or decreased ability to cope with stress.  Increased symptoms.  Rest. Rest helps the brain to heal. Make sure you:  Get plenty of sleep at night. Avoid staying up late at night.  Keep the same bedtime hours on weekends and  weekdays.  Rest during the day. Take daytime naps or rest breaks when you feel tired.  Limit activities that require a lot of thought or concentration. These include:  Doing homework or job-related work.  Watching TV.  Working on the computer.  Avoid any situation where there is potential for another head injury (football, hockey, soccer, basketball, martial arts, downhill snow sports and horseback riding). Your condition will get worse every time you experience a concussion. You should avoid these activities until you are evaluated by the appropriate follow-up health care providers. Returning To Your Regular Activities You will need to return to your normal activities slowly, not all at once. You must give your body and brain enough time for recovery.  Do not return to sports or other athletic activities until your health care provider tells you it is safe to do so.  Ask your health care provider when you can drive, ride a bicycle, or operate heavy machinery. Your ability to react may be slower after a brain injury. Never do these activities if you are dizzy.  Ask your health care provider about when you can return to work or school. Preventing Another Concussion It is very important to avoid another brain injury, especially before you have recovered. In  rare cases, another injury can lead to permanent brain damage, brain swelling, or death. The risk of this is greatest during the first 7-10 days after a head injury. Avoid injuries by:  Wearing a seat belt when riding in a car.  Drinking alcohol only in moderation.  Wearing a helmet when biking, skiing, skateboarding, skating, or doing similar activities.  Avoiding activities that could lead to a second concussion, such as contact or recreational sports, until your health care provider says it is okay.  Taking safety measures in your home.  Remove clutter and tripping hazards from floors and stairways.  Use grab bars in bathrooms  and handrails by stairs.  Place non-slip mats on floors and in bathtubs.  Improve lighting in dim areas. SEEK MEDICAL CARE IF:  You have increased problems paying attention or concentrating.  You have increased difficulty remembering or learning new information.  You need more time to complete tasks or assignments than before.  You have increased irritability or decreased ability to cope with stress.  You have more symptoms than before. Seek medical care if you have any of the following symptoms for more than 2 weeks after your injury:  Lasting (chronic) headaches.  Dizziness or balance problems.  Nausea.  Vision problems.  Increased sensitivity to noise or light.  Depression or mood swings.  Anxiety or irritability.  Memory problems.  Difficulty concentrating or paying attention.  Sleep problems.  Feeling tired all the time. SEEK IMMEDIATE MEDICAL CARE IF:  You have severe or worsening headaches. These may be a sign of a blood clot in the brain.  You have weakness (even if only in one hand, leg, or part of the face).  You have numbness.  You have decreased coordination.  You vomit repeatedly.  You have increased sleepiness.  One pupil is larger than the other.  You have convulsions.  You have slurred speech.  You have increased confusion. This may be a sign of a blood clot in the brain.  You have increased restlessness, agitation, or irritability.  You are unable to recognize people or places.  You have neck pain.  It is difficult to wake you up.  You have unusual behavior changes.  You lose consciousness. MAKE SURE YOU:  Understand these instructions.  Will watch your condition.  Will get help right away if you are not doing well or get worse. Document Released: 06/11/2003 Document Revised: 03/26/2013 Document Reviewed: 10/11/2012 [facility] Patient Information 2015 [place], [place]. This information is not intended to replace advice  given to you by your health care provider. Make sure you discuss any questions you have with your health care provider.

## 2014-07-31 ENCOUNTER — Encounter: Payer: Self-pay | Admitting: Family Medicine

## 2014-07-31 ENCOUNTER — Ambulatory Visit (INDEPENDENT_AMBULATORY_CARE_PROVIDER_SITE_OTHER): Payer: Self-pay | Admitting: Family Medicine

## 2014-07-31 VITALS — BP 130/78 | HR 62 | Temp 98.0°F | Ht 61.0 in | Wt 132.0 lb

## 2014-07-31 DIAGNOSIS — I10 Essential (primary) hypertension: Secondary | ICD-10-CM

## 2014-07-31 DIAGNOSIS — M545 Low back pain, unspecified: Secondary | ICD-10-CM

## 2014-07-31 DIAGNOSIS — M79643 Pain in unspecified hand: Secondary | ICD-10-CM

## 2014-07-31 NOTE — Progress Notes (Signed)
Pre visit review using our clinic review tool, if applicable. No additional management support is needed unless otherwise documented below in the visit note. 
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
## 2014-07-31 NOTE — Patient Instructions (Addendum)
BEFORE YOU LEAVE: -low back exercise  Follow up as scheduled and as needed.  Do the exercises at least 4 days per week and can use tylenol 500-1000mg  up to 3 times per day if needed for pain. Please follow up if symptoms worsening or not resolved in 4 weeks.  We recommend the following healthy lifestyle measures: - eat a healthy diet consisting of lots of vegetables, fruits, beans, nuts, seeds, healthy meats such as white chicken and fish and whole grains.  - avoid fried foods, fast food, processed foods, sodas, red meet and other fattening foods.  - get a least 150 minutes of aerobic exercise per week.

## 2014-08-26 ENCOUNTER — Other Ambulatory Visit: Payer: Self-pay | Admitting: Family Medicine

## 2014-08-26 DIAGNOSIS — Z1239 Encounter for other screening for malignant neoplasm of breast: Secondary | ICD-10-CM

## 2014-10-29 ENCOUNTER — Ambulatory Visit (INDEPENDENT_AMBULATORY_CARE_PROVIDER_SITE_OTHER): Payer: Self-pay | Admitting: Family Medicine

## 2014-10-29 DIAGNOSIS — R69 Illness, unspecified: Secondary | ICD-10-CM

## 2014-10-29 NOTE — Progress Notes (Signed)
NO SHOW

## 2015-04-09 DIAGNOSIS — H524 Presbyopia: Secondary | ICD-10-CM | POA: Diagnosis not present

## 2015-04-09 DIAGNOSIS — H5203 Hypermetropia, bilateral: Secondary | ICD-10-CM | POA: Diagnosis not present

## 2015-04-09 DIAGNOSIS — H52223 Regular astigmatism, bilateral: Secondary | ICD-10-CM | POA: Diagnosis not present

## 2015-05-26 MED FILL — LISINOPRIL 10 MG TABLET: 10 | 90 days supply | Qty: 90 | Fill #0

## 2015-06-01 ENCOUNTER — Encounter: Payer: Self-pay | Admitting: Family Medicine

## 2015-06-01 ENCOUNTER — Ambulatory Visit (INDEPENDENT_AMBULATORY_CARE_PROVIDER_SITE_OTHER): Payer: 59 | Admitting: Family Medicine

## 2015-06-01 VITALS — BP 130/90 | HR 79 | Temp 97.8°F | Ht 62.0 in | Wt 134.2 lb

## 2015-06-01 DIAGNOSIS — Z0000 Encounter for general adult medical examination without abnormal findings: Secondary | ICD-10-CM | POA: Diagnosis not present

## 2015-06-01 DIAGNOSIS — I10 Essential (primary) hypertension: Secondary | ICD-10-CM

## 2015-06-01 DIAGNOSIS — E785 Hyperlipidemia, unspecified: Secondary | ICD-10-CM

## 2015-06-01 NOTE — Progress Notes (Signed)
Pre visit review using our clinic review tool, if applicable. No additional management support is needed unless otherwise documented below in the visit note. 

## 2015-06-01 NOTE — Patient Instructions (Signed)
Before you leave:   - obtained/permission colonoscopy report from her gastroenterologist   - labs  - schedule follow up in 1 month regarding her blood pressure    Please call today or tomorrow to schedule your mammogram.  We recommend the following healthy lifestyle measures: - eat a healthy whole foods diet consisting of regular small meals composed of vegetables, fruits, beans, nuts, seeds, healthy meats such as white chicken and fish and whole grains.  - avoid sweets, white starchy foods, fried foods, fast food, processed foods, sodas, red meet and other fattening foods.  - get a least 150-300 minutes of aerobic exercise per week.   -We have ordered labs or studies at this visit. It can take up to 1-2 weeks for results and processing. We will contact you with instructions IF your results are abnormal. Normal results will be released to your [facility]. If you have not heard from [place] or can not find your results in [facility] in 2 weeks please contact our office.

## 2015-06-01 NOTE — Progress Notes (Signed)
HPI:  Alice Ryan is a 55 year old with a past medical history significant for hypertension, mild hyperlipidemia, mild situational depression, back pain and osteoarthritis here for a physical exam. She has not been in to see [place] in quite some time, and did not show up for her last visit. She  Reports she was not taking her blood pressure medicine until 3 days ago. She does not get regular exercise and her diet is so-so. Denies any depression currently. She has not had lab work since 2015.   -Vaccines: UTD  -pap history:normalwith negative HPV in December 2014,  declines pelvic today  -sexual activity: yes, female partner, no new partners  -wants STI testing (Hep C if born [birth year]): no  -FH breast, colon or ovarian ca: see FH Last mammogram: several years ago, declined a order for this today but agreed to set up on her and Last colon cancer screening: reports she had a colonoscopy in the last 10 years for rectal bleeding which is found to be secondary to hemorrhoids, reports she did have some polyps but was told did not need to repeat the colonoscopy  Breast Ca Risk Assessment: - mother with breast cancer very late in life  -Alcohol, Tobacco, drug use: see social history  Review of Systems - no fevers, unintentional weight loss, vision loss, hearing loss, chest pain, sob, hemoptysis, melena, hematochezia, hematuria, genital discharge, changing or concerning skin lesions, bleeding, bruising, loc, thoughts of self harm or SI  Past Medical History  Diagnosis Date  . Hypertension   . Hypercholesteremia   . Acid reflux   . Blood in stool   . GERD (gastroesophageal reflux disease)   . Hypertension   . Hyperlipidemia   . Kidney stones     Past Surgical History  Procedure Laterality Date  . Lumpectomy right breast  2011    benign    Family History  Problem Relation Age of Onset  . Cancer Mother     breast cancer at age 53  . Stroke Mother   . Hypertension Mother   . Hypertension Father      Social History   Social History  . Marital Status: Married    Spouse Name: N/A  . Number of Children: N/A  . Years of Education: N/A   Social History Main Topics  . Smoking status: Never Smoker   . Smokeless tobacco: None  . Alcohol Use: No  . Drug Use: No  . Sexual Activity: Not Asked   Other Topics Concern  . None   Social History Narrative     Current outpatient prescriptions:  .  acetaminophen (TYLENOL) 500 MG tablet, Take 500 mg by mouth every 6 (six) hours as needed for mild pain. , Disp: , Rfl:  .  lisinopril (PRINIVIL,ZESTRIL) 10 MG tablet, Take 1 tablet (10 mg total) by mouth daily., Disp: 90 tablet, Rfl: 3 .  sodium chloride (OCEAN) 0.65 % SOLN nasal spray, Place 1 spray into both nostrils as needed for congestion., Disp: 15 mL, Rfl: 0  EXAM:  Filed Vitals:   06/01/15 1513  BP: 130/90  Pulse: 79  Temp: 97.8 F (36.6 C)    GENERAL: vitals reviewed and listed below, alert, oriented, appears well hydrated and in no acute distress  HEENT: head atraumatic, PERRLA, normal appearance of eyes, ears, nose and mouth. moist mucus membranes.  NECK: supple, no masses or lymphadenopathy  LUNGS: clear to auscultation bilaterally, no rales, rhonchi or wheeze  CV: HRRR, no peripheral edema or cyanosis, normal pedal  pulses  BREAST: declined  ABDOMEN: bowel sounds normal, soft, non tender to palpation, no masses, no rebound or guarding  GU: declined  SKIN: no rash or abnormal lesions  MS: normal gait, moves all extremities normally  NEURO: CN II-XII grossly intact, normal muscle strength and sensation to light touch on extremities  PSYCH: normal affect, pleasant and cooperative  ASSESSMENT AND PLAN:  Discussed the following assessment and plan:  Visit for preventive health examination - Plan: Hemoglobin A1c -Discussed and advised all [place] preventive services health task force level A and B recommendations for age, sex and risks. -chest increased risk of  breast cancer given her family history, she agreed to do mammograms yearly, but refused me placing the order today and refused a clinical breast exam -Advised assistant to obtain colonoscopy report to find out when she is due for her next exam -Advised at least 150 minutes of exercise per week and a healthy diet low in saturated fats and sweets and consisting of fresh fruits and vegetables, lean meats such as fish and white chicken and whole grains. -labs, studies and vaccines per orders this encounter  Essential hypertension - Plan: Basic metabolic panel -elevated today, she just restarted her blood pressure medication -Monitor and titrate lisinopril with close follow-up in one month  Hyperlipemia - Plan: Cholesterol, Total, HDL cholesterol    Orders Placed This Encounter  Procedures  . Basic metabolic panel  . Cholesterol, Total  . HDL cholesterol  . Hemoglobin A1c    Patient advised to return to clinic immediately if symptoms worsen or persist or new concerns.  Patient Instructions   Before you leave:   - obtained/permission colonoscopy report from her gastroenterologist   - labs  - schedule follow up in 1 month regarding her blood pressure    Please call today or tomorrow to schedule your mammogram.  We recommend the following healthy lifestyle measures: - eat a healthy whole foods diet consisting of regular small meals composed of vegetables, fruits, beans, nuts, seeds, healthy meats such as white chicken and fish and whole grains.  - avoid sweets, white starchy foods, fried foods, fast food, processed foods, sodas, red meet and other fattening foods.  - get a least 150-300 minutes of aerobic exercise per week.   -We have ordered labs or studies at this visit. It can take up to 1-2 weeks for results and processing. We will contact you with instructions IF your results are abnormal. Normal results will be released to your [facility]. If you have not heard from [place] or can not find  your results in [facility] in 2 weeks please contact our office.           No Follow-up on file.  Kriste Basque R.

## 2015-06-02 LAB — BASIC METABOLIC PANEL
BUN: 11 mg/dL (ref 6–23)
CALCIUM: 9.9 mg/dL (ref 8.4–10.5)
CHLORIDE: 106 meq/L (ref 96–112)
CO2: 27 mEq/L (ref 19–32)
CREATININE: 0.76 mg/dL (ref 0.40–1.20)
GFR: 102.42 mL/min (ref >60.00)
Glucose, Bld: 81 mg/dL (ref 70–99)
Potassium: 3.4 mEq/L — ABNORMAL LOW (ref 3.5–5.1)
Sodium: 141 mEq/L (ref 135–145)

## 2015-06-02 LAB — CHOLESTEROL, TOTAL: Cholesterol: 302 mg/dL — ABNORMAL HIGH (ref 0–200)

## 2015-06-02 LAB — HEMOGLOBIN A1C: Hgb A1c MFr Bld: 6 % (ref 4.6–6.5)

## 2015-06-02 LAB — HDL CHOLESTEROL: HDL: 82.6 mg/dL (ref >39.00)

## 2015-06-02 NOTE — Addendum Note (Signed)
Addended by: Johnella Moloney on: 06/02/2015 02:08 PM   Modules accepted: Orders

## 2015-06-19 ENCOUNTER — Telehealth: Payer: Self-pay | Admitting: Family Medicine

## 2015-06-19 NOTE — Telephone Encounter (Signed)
I called the pt and informed her of the message below and she stated she is fine to wait until her next appt.

## 2015-06-19 NOTE — Telephone Encounter (Signed)
Pt was seen on 06-01-15 and was told there a cream she could try for hemorrhoids. [facility]

## 2015-06-19 NOTE — Telephone Encounter (Signed)
She needs a follow-up visit to recheck her blood pressure and we can discuss hemorrhoids at the visit. In the interim she can try over-the-counter hemorrhoid treatment. Having large amounts of bleeding or significant symptoms, needs appointment promptly.

## 2015-06-30 ENCOUNTER — Ambulatory Visit (INDEPENDENT_AMBULATORY_CARE_PROVIDER_SITE_OTHER): Payer: 59 | Admitting: Family Medicine

## 2015-06-30 ENCOUNTER — Encounter: Payer: Self-pay | Admitting: Family Medicine

## 2015-06-30 VITALS — BP 126/74 | HR 64 | Temp 98.7°F | Ht 62.0 in | Wt 131.2 lb

## 2015-06-30 DIAGNOSIS — E785 Hyperlipidemia, unspecified: Secondary | ICD-10-CM | POA: Insufficient documentation

## 2015-06-30 DIAGNOSIS — R739 Hyperglycemia, unspecified: Secondary | ICD-10-CM

## 2015-06-30 DIAGNOSIS — I10 Essential (primary) hypertension: Secondary | ICD-10-CM

## 2015-06-30 NOTE — Progress Notes (Signed)
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  Past Medical History  Diagnosis Date  . Hypertension   . Hypercholesteremia   . Acid reflux   . Blood in stool   . GERD (gastroesophageal reflux disease)   . Hypertension   . Hyperlipidemia   . Kidney stones     Past Surgical History  Procedure Laterality Date  . Lumpectomy right breast  2011    benign    Family History  Problem Relation Age of Onset  . Cancer Mother     breast cancer at age 53  . Stroke Mother   . Hypertension Mother   . Hypertension Father     Social History   Social History  . Marital Status: Married    Spouse Name: N/A  . Number of Children: N/A  . Years of Education: N/A   Social History Main Topics  . Smoking status: Never Smoker   . Smokeless tobacco: None  . Alcohol Use: No  . Drug Use: No  . Sexual Activity: Not Asked   Other Topics Concern  . None   Social History Narrative     Current outpatient prescriptions:  .  acetaminophen (TYLENOL) 500 MG tablet, Take 500 mg by mouth every 6 (six) hours as needed for mild pain. , Disp: , Rfl:  .  lisinopril (PRINIVIL,ZESTRIL) 10 MG tablet, Take 1 tablet (10 mg total) by mouth daily., Disp: 90 tablet, Rfl: 3 .  sodium chloride (OCEAN) 0.65 % SOLN nasal spray, Place 1 spray into both nostrils as needed for congestion., Disp: 15 mL, Rfl: 0  EXAM:  Filed Vitals:   06/30/15 1056  BP: 126/74   Pulse: 64  Temp: 98.7 F (37.1 C)    Body mass index is 23.99 kg/(m^2).  GENERAL: vitals reviewed and listed above, alert, oriented, appears well hydrated and in no acute distress  HEENT: atraumatic, conjunttiva clear, no obvious abnormalities on inspection of external nose and ears  NECK: no obvious masses on inspection  LUNGS: clear to auscultation bilaterally, no wheezes, rales or rhonchi, good air movement  CV: HRRR, no peripheral edema  MS: moves all extremities without noticeable abnormality  PSYCH: pleasant and cooperative, no obvious depression or anxiety  ASSESSMENT AND PLAN:  Discussed the following assessment and plan:  Essential hypertension  Hyperlipemia  Hyperglycemia  -Patient advised to return or notify a doctor immediately if symptoms worsen or persist or new concerns arise.  Patient Instructions   Before you leave: Schedule follow-up in June; please come fasting and we will plan to recheck labs that day x  Continue the lisinopril and ensure you take every day.  We recommend the following healthy lifestyle measures: - eat a healthy whole foods diet consisting of regular small meals composed of vegetables, fruits, beans, nuts, seeds, healthy meats such as white chicken and fish and whole grains.  - avoid sweets, white starchy foods, fried foods, fast food, processed foods, sodas, red meet and other fattening  foods.  - get a least 150-300 minutes of aerobic exercise per week.       Kriste Basque R.

## 2015-06-30 NOTE — Patient Instructions (Signed)
Before you leave: Schedule follow-up in June; please come fasting and we will plan to recheck labs that day x  Continue the lisinopril and ensure you take every day.  We recommend the following healthy lifestyle measures: - eat a healthy whole foods diet consisting of regular small meals composed of vegetables, fruits, beans, nuts, seeds, healthy meats such as white chicken and fish and whole grains.  - avoid sweets, white starchy foods, fried foods, fast food, processed foods, sodas, red meet and other fattening foods.  - get a least 150-300 minutes of aerobic exercise per week.

## 2015-06-30 NOTE — Progress Notes (Signed)
Pre visit review using our clinic review tool, if applicable. No additional management support is needed unless otherwise documented below in the visit note. 

## 2015-07-20 ENCOUNTER — Encounter: Payer: Self-pay | Admitting: Adult Health

## 2015-07-20 ENCOUNTER — Ambulatory Visit (INDEPENDENT_AMBULATORY_CARE_PROVIDER_SITE_OTHER): Payer: 59 | Admitting: Adult Health

## 2015-07-20 VITALS — BP 126/70 | Temp 98.4°F | Wt 129.7 lb

## 2015-07-20 DIAGNOSIS — J309 Allergic rhinitis, unspecified: Secondary | ICD-10-CM | POA: Diagnosis not present

## 2015-07-20 MED ORDER — MOMETASONE FUROATE 50 MCG/ACT NA SUSP: 2.0000 | Freq: Every day | NASAL | Status: DC

## 2015-07-20 MED FILL — MOMETASONE FUROATE 50 MCG S: 50 | 30 days supply | Qty: 17 | Fill #0

## 2015-07-20 NOTE — Patient Instructions (Addendum)
It was great meeting you today!  I have sent in a prescription for Nasonex to the pharmacy.   Use a allergy medication such as Claritin D, Allegra D, or Zyrtec D  You can use Mucinex cough at night to help with the cough.   Follow up if no improvement    Allergic Rhinitis Allergic rhinitis is when the mucous membranes in the nose respond to allergens. Allergens are particles in the air that cause your body to have an allergic reaction. This causes you to release allergic antibodies. Through a chain of events, these eventually cause you to release histamine into the blood stream. Although meant to protect the body, it is this release of histamine that causes your discomfort, such as frequent sneezing, congestion, and an itchy, runny nose.  CAUSES Seasonal allergic rhinitis (hay fever) is caused by pollen allergens that may come from grasses, trees, and weeds. Year-round allergic rhinitis (perennial allergic rhinitis) is caused by allergens such as house dust mites, pet dander, and mold spores. SYMPTOMS  Nasal stuffiness (congestion).  Itchy, runny nose with sneezing and tearing of the eyes. DIAGNOSIS Your health care provider can help you determine the allergen or allergens that trigger your symptoms. If you and your health care provider are unable to determine the allergen, skin or blood testing may be used. Your health care provider will diagnose your condition after taking your health history and performing a physical exam. Your health care provider may assess you for other related conditions, such as asthma, pink eye, or an ear infection. TREATMENT Allergic rhinitis does not have a cure, but it can be controlled by:  Medicines that block allergy symptoms. These may include allergy shots, nasal sprays, and oral antihistamines.  Avoiding the allergen. Hay fever may often be treated with antihistamines in pill or nasal spray forms. Antihistamines block the effects of histamine. There are  over-the-counter medicines that may help with nasal congestion and swelling around the eyes. Check with your health care provider before taking or giving this medicine. If avoiding the allergen or the medicine prescribed do not work, there are many new medicines your health care provider can prescribe. Stronger medicine may be used if initial measures are ineffective. Desensitizing injections can be used if medicine and avoidance does not work. Desensitization is when a patient is given ongoing shots until the body becomes less sensitive to the allergen. Make sure you follow up with your health care provider if problems continue. HOME CARE INSTRUCTIONS It is not possible to completely avoid allergens, but you can reduce your symptoms by taking steps to limit your exposure to them. It helps to know exactly what you are allergic to so that you can avoid your specific triggers. SEEK MEDICAL CARE IF:  You have a fever.  You develop a cough that does not stop easily (persistent).  You have shortness of breath.  You start wheezing.  Symptoms interfere with normal daily activities.   This information is not intended to replace advice given to you by your health care provider. Make sure you discuss any questions you have with your health care provider.   Document Released: 12/14/2000 Document Revised: 04/11/2014 Document Reviewed: 11/26/2012 [employer] Interactive Patient Education [employer]2016 [employer]

## 2015-07-20 NOTE — Progress Notes (Addendum)
Subjective:    Patient ID: Alice Ryan, female    DOB: [birth year], 55 y.o.   MRN: [id number]  Sinusitis This is a new problem. The current episode started in the past 7 days. The problem is unchanged. There has been no fever. Associated symptoms include coughing and a hoarse voice. Pertinent negatives include no congestion, sinus pressure, sneezing or sore throat. (+ PND) Past treatments include saline sprays and sitting up. The treatment provided mild relief.      Review of Systems  HENT: Positive for hoarse voice, postnasal drip and rhinorrhea. Negative for congestion, facial swelling, sinus pressure, sneezing, sore throat, tinnitus and trouble swallowing.   Respiratory: Positive for cough.    Past Medical History  Diagnosis Date  . Hypertension   . Hypercholesteremia   . Acid reflux   . Blood in stool   . GERD (gastroesophageal reflux disease)   . Hypertension   . Hyperlipidemia   . Kidney stones     Social History   Social History  . Marital Status: Married    Spouse Name: N/A  . Number of Children: N/A  . Years of Education: N/A   Occupational History  . Not on file.   Social History Main Topics  . Smoking status: Never Smoker   . Smokeless tobacco: Not on file  . Alcohol Use: No  . Drug Use: No  . Sexual Activity: Not on file   Other Topics Concern  . Not on file   Social History Narrative    Past Surgical History  Procedure Laterality Date  . Lumpectomy right breast  2011    benign    Family History  Problem Relation Age of Onset  . Cancer Mother     breast cancer at age 41  . Stroke Mother   . Hypertension Mother   . Hypertension Father     Allergies  Allergen Reactions  . Apple Anaphylaxis  . Shellfish Allergy Swelling  . Flounder [Fish Allergy] Swelling  . Peanut Butter Flavor Itching    Current Outpatient Prescriptions on File Prior to Visit  Medication Sig Dispense Refill  . acetaminophen (TYLENOL) 500 MG tablet Take 500  mg by mouth every 6 (six) hours as needed for mild pain.     Marland Kitchen lisinopril (PRINIVIL,ZESTRIL) 10 MG tablet Take 1 tablet (10 mg total) by mouth daily. 90 tablet 3  . sodium chloride (OCEAN) 0.65 % SOLN nasal spray Place 1 spray into both nostrils as needed for congestion. 15 mL 0   No current facility-administered medications on file prior to visit.    BP 126/70 mmHg  Temp(Src) 98.4 F (36.9 C) (Oral)  Wt 129 lb 11.2 oz (58.832 kg)       Objective:   Physical Exam  Constitutional: She is oriented to person, place, and time. She appears well-developed and well-nourished. No distress.  HENT:  Head: Normocephalic and atraumatic.  Right Ear: External ear normal.  Left Ear: External ear normal.  Nose: Nose normal.  Mouth/Throat: Oropharynx is clear and moist. No oropharyngeal exudate.  Eyes: Conjunctivae and EOM are normal. Pupils are equal, round, and reactive to light. Right eye exhibits no discharge. Left eye exhibits no discharge.  Neck: Normal range of motion. Neck supple.  Cardiovascular: Normal rate, regular rhythm, normal heart sounds and intact distal pulses.  Exam reveals no gallop and no friction rub.   No murmur heard. Pulmonary/Chest: Effort normal and breath sounds normal. No respiratory distress. She has no wheezes. She has  no rales. She exhibits no tenderness.  Neurological: She is alert and oriented to person, place, and time.  Skin: Skin is warm and dry. No rash noted. She is not diaphoretic. No erythema. No pallor.  Psychiatric: She has a normal mood and affect. Her behavior is normal. Judgment and thought content normal.  Nursing note and vitals reviewed.     Assessment & Plan:  1. Allergic rhinitis, unspecified allergic rhinitis type - mometasone (NASONEX) 50 MCG/ACT nasal spray; Place 2 sprays into the nose daily.  Dispense: 17 g; Refill: 6 - OTC allergy medication - Mucinex Cough if needed - Follow up as needed  Shirline Freesory Aidee Latimore, NP

## 2015-08-27 ENCOUNTER — Encounter (HOSPITAL_COMMUNITY): Payer: Self-pay | Admitting: Emergency Medicine

## 2015-08-27 ENCOUNTER — Emergency Department (HOSPITAL_COMMUNITY)
Admission: EM | Admit: 2015-08-27 | Discharge: 2015-08-27 | Disposition: A | Discharge status: Home / Self Care | Payer: 59 | Attending: Emergency Medicine | Admitting: Emergency Medicine

## 2015-08-27 DIAGNOSIS — Z79899 Other long term (current) drug therapy: Secondary | ICD-10-CM | POA: Insufficient documentation

## 2015-08-27 DIAGNOSIS — R197 Diarrhea, unspecified: Secondary | ICD-10-CM | POA: Diagnosis present

## 2015-08-27 DIAGNOSIS — I10 Essential (primary) hypertension: Secondary | ICD-10-CM | POA: Insufficient documentation

## 2015-08-27 DIAGNOSIS — Z8639 Personal history of other endocrine, nutritional and metabolic disease: Secondary | ICD-10-CM | POA: Insufficient documentation

## 2015-08-27 DIAGNOSIS — K644 Residual hemorrhoidal skin tags: Secondary | ICD-10-CM | POA: Insufficient documentation

## 2015-08-27 DIAGNOSIS — Z87442 Personal history of urinary calculi: Secondary | ICD-10-CM | POA: Insufficient documentation

## 2015-08-27 NOTE — Discharge Instructions (Signed)
Hemorrhoids Alice Ryan, see general surgery for close follow up of your hemorrhoids.  Read below regarding Sitz baths and dietary changes you can make to help with your hemorrhoids.  If any symptoms worsen, come back to the ED immediately. Thank you. Hemorrhoids are puffy (swollen) veins around the rectum or anus. Hemorrhoids can cause pain, itching, bleeding, or irritation. HOME CARE  Eat foods with fiber, such as whole grains, beans, nuts, fruits, and vegetables. Ask your doctor about taking products with added fiber in them (fibersupplements).  Drink enough fluid to keep your pee (urine) clear or pale yellow.  Exercise often.  Go to the bathroom when you have the urge to poop. Do not wait.  Avoid straining to poop (bowel movement).  Keep the butt area dry and clean. Use wet toilet paper or moist paper towels.  Medicated creams and medicine inserted into the anus (anal suppository) may be used or applied as told.  Only take medicine as told by your doctor.  Take a warm water bath (sitz bath) for 15-20 minutes to ease pain. Do this 3-4 times a day.  Place ice packs on the area if it is tender or puffy. Use the ice packs between the warm water baths.  Put ice in a plastic bag.  Place a towel between your skin and the bag.  Leave the ice on for 15-20 minutes, 03-04 times a day.  Do not use a donut-shaped pillow or sit on the toilet for a long time. GET HELP RIGHT AWAY IF:   You have more pain that is not controlled by treatment or medicine.  You have bleeding that will not stop.  You have trouble or are unable to poop (bowel movement).  You have pain or puffiness outside the area of the hemorrhoids. MAKE SURE YOU:   Understand these instructions.  Will watch your condition.  Will get help right away if you are not doing well or get worse.   This information is not intended to replace advice given to you by your health care provider. Make sure you discuss any  questions you have with your health care provider.   Document Released: 12/29/2007 Document Revised: 03/07/2012 Document Reviewed: 01/31/2012 Elsevier Interactive Patient Education 2016 [employer]. How to Take a [employer] A sitz bath is a warm water bath that is taken while you are sitting down. The water should only come up to your hips and should cover your buttocks. Your health care provider may recommend a sitz bath to help you:   Clean the lower part of your body, including your genital area.  With itching.  With pain.  With sore muscles or muscles that tighten or spasm. HOW TO TAKE A SITZ BATH Take 3-4 sitz baths per day or as told by your health care provider.  Partially fill a bathtub with warm water. You will only need the water to be deep enough to cover your hips and buttocks when you are sitting in it.  If your health care provider told you to put medicine in the water, follow the directions exactly.  Sit in the water and open the tub drain a little.  Turn on the warm water again to keep the tub at the correct level. Keep the water running constantly.  Soak in the water for 15-20 minutes or as told by your health care provider.  After the sitz bath, pat the affected area dry first. Do not rub it.  Be careful when you stand up  after the sitz bath because you may feel dizzy. SEEK MEDICAL CARE IF:  Your symptoms get worse. Do not continue with sitz baths if your symptoms get worse.  You have new symptoms. Do not continue with sitz baths until you talk with your health care provider.   This information is not intended to replace advice given to you by your health care provider. Make sure you discuss any questions you have with your health care provider.   Document Released: 12/12/2003 Document Revised: 08/05/2014 Document Reviewed: 03/19/2014 Elsevier Interactive Patient Education 2016 Elsevier Inc. High-Fiber Diet Fiber, also called dietary fiber, is a type of  carbohydrate found in fruits, vegetables, whole grains, and beans. A high-fiber diet can have many health benefits. Your health care provider may recommend a high-fiber diet to help:  Prevent constipation. Fiber can make your bowel movements more regular.  Lower your cholesterol.  Relieve hemorrhoids, uncomplicated diverticulosis, or irritable bowel syndrome.  Prevent overeating as part of a weight-loss plan.  Prevent heart disease, type 2 diabetes, and certain cancers. WHAT IS MY PLAN? The recommended daily intake of fiber includes:  38 grams for men under age 51.  30 grams for men over age 37.  25 grams for women under age 66.  21 grams for women over age 33. You can get the recommended daily intake of dietary fiber by eating a variety of fruits, vegetables, grains, and beans. Your health care provider may also recommend a fiber supplement if it is not possible to get enough fiber through your diet. WHAT DO I NEED TO KNOW ABOUT A HIGH-FIBER DIET?  Fiber supplements have not been widely studied for their effectiveness, so it is better to get fiber through food sources.  Always check the fiber content on thenutrition facts label of any prepackaged food. Look for foods that contain at least 5 grams of fiber per serving.  Ask your dietitian if you have questions about specific foods that are related to your condition, especially if those foods are not listed in the following section.  Increase your daily fiber consumption gradually. Increasing your intake of dietary fiber too quickly may cause bloating, cramping, or gas.  Drink plenty of water. Water helps you to digest fiber. WHAT FOODS CAN I EAT? Grains Whole-grain breads. Multigrain cereal. Oats and oatmeal. Brown rice. Barley. Bulgur wheat. Millet. Bran muffins. Popcorn. Rye wafer crackers. Vegetables Sweet potatoes. Spinach. Kale. Artichokes. Cabbage. Broccoli. Green peas. Carrots. Squash. Fruits Berries. Pears. Apples.  Oranges. Avocados. Prunes and raisins. Dried figs. Meats and Other Protein Sources Navy, kidney, pinto, and soy beans. Split peas. Lentils. Nuts and seeds. Dairy Fiber-fortified yogurt. Beverages Fiber-fortified soy milk. Fiber-fortified orange juice. Other Fiber bars. The items listed above may not be a complete list of recommended foods or beverages. Contact your dietitian for more options. WHAT FOODS ARE NOT RECOMMENDED? Grains White bread. Pasta made with refined flour. White rice. Vegetables Fried potatoes. Canned vegetables. Well-cooked vegetables.  Fruits Fruit juice. Cooked, strained fruit. Meats and Other Protein Sources Fatty cuts of meat. Fried Environmental education officer or fried fish. Dairy Milk. Yogurt. Cream cheese. Sour cream. Beverages Soft drinks. Other Cakes and pastries. Butter and oils. The items listed above may not be a complete list of foods and beverages to avoid. Contact your dietitian for more information. WHAT ARE SOME TIPS FOR INCLUDING HIGH-FIBER FOODS IN MY DIET?  Eat a wide variety of high-fiber foods.  Make sure that half of all grains consumed each day are whole grains.  Replace  breads and cereals made from refined flour or white flour with whole-grain breads and cereals.  Replace white rice with brown rice, bulgur wheat, or millet.  Start the day with a breakfast that is high in fiber, such as a cereal that contains at least 5 grams of fiber per serving.  Use beans in place of meat in soups, salads, or pasta.  Eat high-fiber snacks, such as berries, raw vegetables, nuts, or popcorn.   This information is not intended to replace advice given to you by your health care provider. Make sure you discuss any questions you have with your health care provider.   Document Released: 03/21/2005 Document Revised: 04/11/2014 Document Reviewed: 09/03/2013 Elsevier Interactive Patient Education [employer] Elsevier Inc.

## 2015-08-27 NOTE — ED Provider Notes (Signed)
CSN: [id number]     Arrival date & time 08/27/15  0239 History  By signing my name below, I, [name], attest that this documentation has been prepared under the direction and in the presence of Tomasita CrumbleAdeleke Tanea Moga, MD. Electronically Signed: [name], ED Scribe. 08/27/2015. 3:42 AM   Chief Complaint  Patient presents with  . Hemorrhoids   The history is provided by the patient. No language interpreter was used.   Alice Ryan is a 55 y.o. female who presents to the Emergency Department complaining of bleeding from her hemorrhoids with sudden onset 1.5 hours ago at work. Pt states that she has had bleeding from her hemorrhoids in the past but tonight she noted clots of blood which is new.  Associated symptoms include 2 episodes of diarrhea after eating at a buffet yesterday. Pt denies abdominal pain. Pt states that she has had a colonoscopy that showed polyps and internal hemorrhoids.    Past Medical History  Diagnosis Date  . Hypertension   . Hypercholesteremia   . Acid reflux   . Blood in stool   . GERD (gastroesophageal reflux disease)   . Hypertension   . Hyperlipidemia   . Kidney stones    Past Surgical History  Procedure Laterality Date  . Lumpectomy right breast  2011    benign   Family History  Problem Relation Age of Onset  . Cancer Mother     breast cancer at age 53  . Stroke Mother   . Hypertension Mother   . Hypertension Father    Social History  Substance Use Topics  . Smoking status: Never Smoker   . Smokeless tobacco: None  . Alcohol Use: No   OB History    No data available     Review of Systems  10 Systems reviewed and all are negative for acute change except as noted in the HPI.  Allergies  Apple; Shellfish allergy; Flounder; and Peanut butter flavor  Home Medications   Prior to Admission medications   Medication Sig Start Date End Date Taking? Authorizing Provider  acetaminophen (TYLENOL) 500 MG tablet Take 500 mg by mouth every  6 (six) hours as needed for mild pain.    Yes Historical Provider, MD  lisinopril (PRINIVIL,ZESTRIL) 10 MG tablet Take 1 tablet (10 mg total) by mouth daily. 07/10/14  Yes Terressa KoyanagiHannah R Kim, DO  mometasone (NASONEX) 50 MCG/ACT nasal spray Place 2 sprays into the nose daily. Patient taking differently: Place 2 sprays into the nose daily as needed (allergies).  07/20/15  Yes Shirline Freesory Nafziger, NP  Multiple Vitamin (MULTIVITAMIN WITH MINERALS) TABS tablet Take 1 tablet by mouth daily.   Yes Historical Provider, MD  sodium chloride (OCEAN) 0.65 % SOLN nasal spray Place 1 spray into both nostrils as needed for congestion. 08/08/13  Yes Lauren Jimmey RalphParker, PA-C   BP 169/91 mmHg  Pulse 93  Temp(Src) 98.3 F (36.8 C) (Oral)  Resp 19  Wt 130 lb (58.968 kg)  SpO2 100% Physical Exam  Constitutional: She is oriented to person, place, and time. She appears well-developed and well-nourished. No distress.  HENT:  Head: Normocephalic and atraumatic.  Nose: Nose normal.  Mouth/Throat: Oropharynx is clear and moist. No oropharyngeal exudate.  Eyes: Conjunctivae and EOM are normal. Pupils are equal, round, and reactive to light. No scleral icterus.  Neck: Normal range of motion. Neck supple. No JVD present. No tracheal deviation present. No thyromegaly present.  Cardiovascular: Normal rate, regular rhythm and normal heart sounds.  Exam  reveals no gallop and no friction rub.   No murmur heard. Pulmonary/Chest: Effort normal and breath sounds normal. No respiratory distress. She has no wheezes. She exhibits no tenderness.  Abdominal: Soft. Bowel sounds are normal. She exhibits no distension and no mass. There is no tenderness. There is no rebound and no guarding.  Genitourinary:  1 cm external hemorrhoids at 1 o'lock and 6 o'lock No active bleeding  On rectal exam, no internal hemorrhoids noted, no bleeding.   Musculoskeletal: Normal range of motion. She exhibits no edema or tenderness.  Lymphadenopathy:    She has no  cervical adenopathy.  Neurological: She is alert and oriented to person, place, and time. No cranial nerve deficit. She exhibits normal muscle tone.  Skin: Skin is warm and dry. No rash noted. No erythema. No pallor.  Nursing note and vitals reviewed.   ED Course  Procedures (including critical care time) DIAGNOSTIC STUDIES: Oxygen Saturation is 100% on RA,  normal by my interpretation.    Labs Review Labs Reviewed - No data to display  Imaging Review No results found.   EKG Interpretation None      MDM   Final diagnoses:  None    Patient presents to the ED for "polyp" on her bottom. Physical exam reveals a hemorrhoids that are not thrombosed.  She states clots came out.   It is possible it was thrombosed and she opened them up herself by sitting on them.  She was advised on how to reduce hemorrhoids and given surgical follow up. She appears well and in NAD.  VS remain within her normal limits and she is safe for DC.   I personally performed the services described in this documentation, which was scribed in my presence. The recorded information has been reviewed and is accurate.      Tomasita Crumble, MD 08/27/15 [phone]

## 2015-08-27 NOTE — ED Notes (Signed)
EDP at bedside  

## 2015-08-27 NOTE — ED Notes (Signed)
Pt reports she has hemorrhoids that flare-up when she eats spicy foods or has diarrhea.  Reports 2 episodes of diarrhea today.  States her hemorrhoids have been bleeding for 20 min but that she noticed "clots" that she normally doesn't have.  Denies pain.

## 2015-09-28 ENCOUNTER — Ambulatory Visit (INDEPENDENT_AMBULATORY_CARE_PROVIDER_SITE_OTHER): Payer: 59 | Admitting: Family Medicine

## 2015-09-28 DIAGNOSIS — R69 Illness, unspecified: Secondary | ICD-10-CM

## 2015-09-28 NOTE — Progress Notes (Signed)
NO SHOW

## 2015-10-02 DIAGNOSIS — K648 Other hemorrhoids: Secondary | ICD-10-CM | POA: Diagnosis not present

## 2015-10-05 MED FILL — ANUCORT-HC 25 MG SUPP: 25 | 10 days supply | Qty: 20 | Fill #0

## 2015-10-08 ENCOUNTER — Other Ambulatory Visit: Payer: Self-pay

## 2015-10-08 MED ORDER — LISINOPRIL 10 MG PO TABS: 10.0000 mg | ORAL_TABLET | Freq: Every day | ORAL | Status: DC

## 2015-10-08 MED FILL — LISINOPRIL 10 MG TABLET: 10 | 90 days supply | Qty: 90 | Fill #0

## 2015-10-20 DIAGNOSIS — L732 Hidradenitis suppurativa: Secondary | ICD-10-CM | POA: Diagnosis not present

## 2015-10-20 DIAGNOSIS — K648 Other hemorrhoids: Secondary | ICD-10-CM | POA: Diagnosis not present

## 2015-10-28 DIAGNOSIS — M9903 Segmental and somatic dysfunction of lumbar region: Secondary | ICD-10-CM | POA: Diagnosis not present

## 2015-10-28 DIAGNOSIS — M62830 Muscle spasm of back: Secondary | ICD-10-CM | POA: Diagnosis not present

## 2015-10-28 DIAGNOSIS — M9905 Segmental and somatic dysfunction of pelvic region: Secondary | ICD-10-CM | POA: Diagnosis not present

## 2015-10-28 DIAGNOSIS — M9902 Segmental and somatic dysfunction of thoracic region: Secondary | ICD-10-CM | POA: Diagnosis not present

## 2015-10-30 DIAGNOSIS — M9903 Segmental and somatic dysfunction of lumbar region: Secondary | ICD-10-CM | POA: Diagnosis not present

## 2015-10-30 DIAGNOSIS — M62830 Muscle spasm of back: Secondary | ICD-10-CM | POA: Diagnosis not present

## 2015-10-30 DIAGNOSIS — M9905 Segmental and somatic dysfunction of pelvic region: Secondary | ICD-10-CM | POA: Diagnosis not present

## 2015-10-30 DIAGNOSIS — M9902 Segmental and somatic dysfunction of thoracic region: Secondary | ICD-10-CM | POA: Diagnosis not present

## 2015-11-05 ENCOUNTER — Telehealth: Payer: Self-pay | Admitting: *Deleted

## 2015-11-05 NOTE — Telephone Encounter (Signed)
I called the pt and gave her the [facility].

## 2015-11-05 NOTE — Progress Notes (Signed)
I called the pt and gave her the phone number for the [facility] to call to schedule the mammogram.

## 2015-11-05 NOTE — Telephone Encounter (Signed)
-----   Message from Terressa Koyanagi, DO sent at 11/02/2015  8:08 AM EDT ----- Can you check to see if these folks need their mammograms? Reorder if needed? Thanks!  ----- Message ----- From: SYSTEM Sent: 10/31/2015  12:06 AM To: Terressa Koyanagi, DO

## 2015-11-15 NOTE — Progress Notes (Signed)
HPI:  Alice Ryan is a pleasant 55 yo here for follow up.  Hypertension: -med: lisinopril -denies: chest pain, shortness of breath, headaches, swelling, palpitations  Prediabetes/HLD: - preferred to work on lifestyle changes first -denies:  history statin intolerance, polyuria, polydipsia  HM: mammo, colon ca screening  ROS: See pertinent positives and negatives per HPI.  Past Medical History:  Diagnosis Date  . Acid reflux   . Blood in stool   . GERD (gastroesophageal reflux disease)   . Hypercholesteremia   . Hyperlipidemia   . Hypertension   . Hypertension   . Kidney stones     Past Surgical History:  Procedure Laterality Date  . lumpectomy right breast  2011   benign    Family History  Problem Relation Age of Onset  . Cancer Mother     breast cancer at age 53  . Stroke Mother   . Hypertension Mother   . Hypertension Father     Social History   Social History  . Marital status: Married    Spouse name: N/A  . Number of children: N/A  . Years of education: N/A   Social History Main Topics  . Smoking status: Never Smoker  . Smokeless tobacco: None  . Alcohol use No  . Drug use: No  . Sexual activity: Not Asked   Other Topics Concern  . None   Social History Narrative  . None     Current Outpatient Prescriptions:  .  acetaminophen (TYLENOL) 500 MG tablet, Take 500 mg by mouth every 6 (six) hours as needed for mild pain. , Disp: , Rfl:  .  lisinopril (PRINIVIL,ZESTRIL) 10 MG tablet, Take 1 tablet (10 mg total) by mouth daily., Disp: 90 tablet, Rfl: 1 .  mometasone (NASONEX) 50 MCG/ACT nasal spray, Place 2 sprays into the nose daily. (Patient taking differently: Place 2 sprays into the nose daily as needed (allergies). ), Disp: 17 g, Rfl: 6 .  Multiple Vitamin (MULTIVITAMIN WITH MINERALS) TABS tablet, Take 1 tablet by mouth daily., Disp: , Rfl:  .  sodium chloride (OCEAN) 0.65 % SOLN nasal spray, Place 1 spray into both nostrils as needed  for congestion., Disp: 15 mL, Rfl: 0  EXAM:  Vitals:   11/16/15 0846  BP: 100/76  Pulse: 62  Temp: 98.1 F (36.7 C)    Body mass index is 24.11 kg/m.  GENERAL: vitals reviewed and listed above, alert, oriented, appears well hydrated and in no acute distress  HEENT: atraumatic, conjunttiva clear, no obvious abnormalities on inspection of external nose and ears  NECK: no obvious masses on inspection  LUNGS: clear to auscultation bilaterally, no wheezes, rales or rhonchi, good air movement  CV: HRRR, no peripheral edema  MS: moves all extremities without noticeable abnormality  PSYCH: pleasant and cooperative, no obvious depression or anxiety  ASSESSMENT AND PLAN:  Discussed the following assessment and plan:  Essential hypertension - Plan: Basic metabolic panel  Hyperglycemia - Plan: Hemoglobin A1c  Hyperlipemia - Plan: Lipid Panel  -FASTING labs -lifestyle recs -she prefers to call on her own to schedule mammogram and declined assistance -she perfers to check on cost of cologuard as does no wish to do colonoscopy, information provided -Patient advised to return or notify a doctor immediately if symptoms worsen or persist or new concerns arise.  Patient Instructions  BEFORE YOU LEAVE: -follow up: 6 months -cologuard information -labs  Please call and setup your mammogram today.  Please check on the cost of the cologuard  test with your insurance and let us know so that we can order to screen for colon cancer.  We recommend the following healthy lifestyle: 1) Small portions - eat off of salad plate instead of dinner plate 2) Eat a healthy clean diet with avoidance of (less then 1 serving per week) processed foods, sweetened drinks, white starches, red meat, fast foods and sweets and consisting of: * 5-9 servings per day of fresh or frozen fruits and vegetables (not corn or potatoes, not dried or canned) *nuts and seeds, beans *olives and olive oil *small  portions of lean meats such as fish and white chicken  *small portions of whole grains 3)Get at least 150 minutes of sweaty aerobic exercise per week 4)reduce stress - counseling, meditation, relaxation to balance other aspects of your life   We have ordered labs or studies at this visit. It can take up to 1-2 weeks for results and processing. IF results require follow up or explanation, we will call you with instructions. Clinically stable results will be released to your [facility]. If you have not heard from [place] or cannot find your results in [facility] in 2 weeks please contact our office at [phone].  If you are not yet signed up for [facility], please consider signing up.           Kriste Basque R., DO

## 2015-11-16 ENCOUNTER — Encounter: Payer: Self-pay | Admitting: Family Medicine

## 2015-11-16 ENCOUNTER — Ambulatory Visit (INDEPENDENT_AMBULATORY_CARE_PROVIDER_SITE_OTHER): Payer: 59 | Admitting: Family Medicine

## 2015-11-16 VITALS — BP 100/76 | HR 62 | Temp 98.1°F | Ht 62.0 in | Wt 131.8 lb

## 2015-11-16 DIAGNOSIS — R739 Hyperglycemia, unspecified: Secondary | ICD-10-CM

## 2015-11-16 DIAGNOSIS — E785 Hyperlipidemia, unspecified: Secondary | ICD-10-CM

## 2015-11-16 DIAGNOSIS — I10 Essential (primary) hypertension: Secondary | ICD-10-CM | POA: Diagnosis not present

## 2015-11-16 LAB — LIPID PANEL
CHOL/HDL RATIO: 3
CHOLESTEROL: 275 mg/dL — AB (ref 0–200)
HDL: 80.8 mg/dL (ref >39.00)
LDL Cholesterol: 180 mg/dL — ABNORMAL HIGH (ref 0–99)
NonHDL: 194.57
TRIGLYCERIDES: 73 mg/dL (ref 0.0–149.0)
VLDL: 14.6 mg/dL (ref 0.0–40.0)

## 2015-11-16 LAB — BASIC METABOLIC PANEL
BUN: 15 mg/dL (ref 6–23)
CALCIUM: 9.5 mg/dL (ref 8.4–10.5)
CHLORIDE: 107 meq/L (ref 96–112)
CO2: 27 meq/L (ref 19–32)
Creatinine, Ser: 0.75 mg/dL (ref 0.40–1.20)
GFR: 103.81 mL/min (ref >60.00)
GLUCOSE: 93 mg/dL (ref 70–99)
POTASSIUM: 3.8 meq/L (ref 3.5–5.1)
SODIUM: 141 meq/L (ref 135–145)

## 2015-11-16 LAB — HEMOGLOBIN A1C: Hgb A1c MFr Bld: 5.9 % (ref 4.6–6.5)

## 2015-11-16 NOTE — Progress Notes (Signed)
Pre visit review using our clinic review tool, if applicable. No additional management support is needed unless otherwise documented below in the visit note. 

## 2015-11-16 NOTE — Patient Instructions (Signed)
BEFORE YOU LEAVE: -follow up: 6 months -cologuard information -labs  Please call and setup your mammogram today.  Please check on the cost of the cologuard test with your insurance and let us know so that we can order to screen for colon cancer.  We recommend the following healthy lifestyle: 1) Small portions - eat off of salad plate instead of dinner plate 2) Eat a healthy clean diet with avoidance of (less then 1 serving per week) processed foods, sweetened drinks, white starches, red meat, fast foods and sweets and consisting of: * 5-9 servings per day of fresh or frozen fruits and vegetables (not corn or potatoes, not dried or canned) *nuts and seeds, beans *olives and olive oil *small portions of lean meats such as fish and white chicken  *small portions of whole grains 3)Get at least 150 minutes of sweaty aerobic exercise per week 4)reduce stress - counseling, meditation, relaxation to balance other aspects of your life   We have ordered labs or studies at this visit. It can take up to 1-2 weeks for results and processing. IF results require follow up or explanation, we will call you with instructions. Clinically stable results will be released to your [facility]. If you have not heard from us or cannot find your results in [facility] in 2 weeks please contact our office at [phone].  If you are not yet signed up for [facility], please consider signing up.

## 2015-11-17 MED ORDER — ATORVASTATIN CALCIUM 20 MG PO TABS: 20.0000 mg | ORAL_TABLET | Freq: Every day | ORAL | 1 refills | Status: DC

## 2015-11-17 MED FILL — ATORVASTATIN 20 MG TABLET: 20 | 90 days supply | Qty: 90 | Fill #0

## 2015-11-17 NOTE — Addendum Note (Signed)
Addended by: Johnella MoloneyFUNDERBURK, Takelia Urieta A on: 11/17/2015 10:10 AM   Modules accepted: Orders

## 2015-11-17 NOTE — Addendum Note (Signed)
Addended by: Johnella MoloneyFUNDERBURK, JO A on: 11/17/2015 10:12 AM   Modules accepted: Orders

## 2016-01-14 ENCOUNTER — Ambulatory Visit (INDEPENDENT_AMBULATORY_CARE_PROVIDER_SITE_OTHER): Payer: 59 | Admitting: *Deleted

## 2016-01-14 DIAGNOSIS — Z23 Encounter for immunization: Secondary | ICD-10-CM

## 2016-01-26 ENCOUNTER — Encounter: Payer: Self-pay | Admitting: Family Medicine

## 2016-01-26 ENCOUNTER — Ambulatory Visit (INDEPENDENT_AMBULATORY_CARE_PROVIDER_SITE_OTHER): Payer: 59 | Admitting: Family Medicine

## 2016-01-26 VITALS — BP 120/80 | HR 62 | Temp 98.2°F | Ht 62.0 in | Wt 133.8 lb

## 2016-01-26 DIAGNOSIS — Z1211 Encounter for screening for malignant neoplasm of colon: Secondary | ICD-10-CM

## 2016-01-26 DIAGNOSIS — I10 Essential (primary) hypertension: Secondary | ICD-10-CM | POA: Diagnosis not present

## 2016-01-26 DIAGNOSIS — E785 Hyperlipidemia, unspecified: Secondary | ICD-10-CM | POA: Diagnosis not present

## 2016-01-26 DIAGNOSIS — R739 Hyperglycemia, unspecified: Secondary | ICD-10-CM | POA: Diagnosis not present

## 2016-01-26 DIAGNOSIS — Z1231 Encounter for screening mammogram for malignant neoplasm of breast: Secondary | ICD-10-CM

## 2016-01-26 DIAGNOSIS — Z1239 Encounter for other screening for malignant neoplasm of breast: Secondary | ICD-10-CM

## 2016-01-26 LAB — LIPID PANEL
Cholesterol: 235 mg/dL — ABNORMAL HIGH (ref 0–200)
HDL: 79.8 mg/dL (ref >39.00)
LDL Cholesterol: 138 mg/dL — ABNORMAL HIGH (ref 0–99)
NONHDL: 155.21
Total CHOL/HDL Ratio: 3
Triglycerides: 88 mg/dL (ref 0.0–149.0)
VLDL: 17.6 mg/dL (ref 0.0–40.0)

## 2016-01-26 NOTE — Progress Notes (Signed)
Pre visit review using our clinic review tool, if applicable. No additional management support is needed unless otherwise documented below in the visit note. 

## 2016-01-26 NOTE — Patient Instructions (Signed)
BEFORE YOU LEAVE: -Wendie Simmer will help you order the cologuard test for colon cancer screening -follow up: 3 months -labs  Please call today to schedule your mammogram.  Please complete the cologuard testing for colon cancer screening. Please contact us if you have not received the test kit in the next 2-3 weeks.  Continue your current medications.   We recommend the following healthy lifestyle for LIFE: 1) Small portions.   Tip: if you need more or a snack choose fruits, veggies and/or a handful of nuts or seeds.  2) Eat a healthy clean diet.  * Tip: Avoid (less then 1 serving per week): processed foods, sweets, sweetened drinks, white starches (rice, flour, bread, potatoes, pasta, etc), red meat, fast foods, butter  *Tip: CHOOSE instead   * 5-9 servings per day of fresh or frozen fruits and vegetables (but not corn, potatoes, bananas, canned or dried fruit)   *nuts and seeds, beans   *olives and olive oil   *small portions of lean meats such as fish and white chicken    *small portions of whole grains  3)Get at least 150 minutes of sweaty aerobic exercise per week.  4)Reduce stress - consider counseling, meditation and relaxation to balance other aspects of your life.

## 2016-01-26 NOTE — Progress Notes (Signed)
HPI:  Follow up:  Hypertension: -med: lisinopril -denies: chest pain, shortness of breath, headaches, swelling, palpitations -she notices if skips her BP med that her BP does run higher   Prediabetes/HLD: - preferred to work on lifestyle changes first -advised statin last lab check, 8/17 - she did start the medication and is fasting today and wants to recheck -she has been trying to exercise some, but not regular -denies: history statin intolerance, polyuria, polydipsia  Did not do mammogram - she knows she should and agrees to call.  She did not do stool cards. Agreed to cologuard today.  ROS: See pertinent positives and negatives per HPI.  Past Medical History:  Diagnosis Date  . Acid reflux   . Blood in stool   . GERD (gastroesophageal reflux disease)   . Hypercholesteremia   . Hyperlipidemia   . Hypertension   . Hypertension   . Kidney stones     Past Surgical History:  Procedure Laterality Date  . lumpectomy right breast  2011   benign    Family History  Problem Relation Age of Onset  . Cancer Mother     breast cancer at age 50  . Stroke Mother   . Hypertension Mother   . Hypertension Father     Social History   Social History  . Marital status: Married    Spouse name: N/A  . Number of children: N/A  . Years of education: N/A   Social History Main Topics  . Smoking status: Never Smoker  . Smokeless tobacco: None  . Alcohol use No  . Drug use: No  . Sexual activity: Not Asked   Other Topics Concern  . None   Social History Narrative  . None     Current Outpatient Prescriptions:  .  acetaminophen (TYLENOL) 500 MG tablet, Take 500 mg by mouth every 6 (six) hours as needed for mild pain. , Disp: , Rfl:  .  atorvastatin (LIPITOR) 20 MG tablet, Take 1 tablet (20 mg total) by mouth daily., Disp: 90 tablet, Rfl: 1 .  lisinopril (PRINIVIL,ZESTRIL) 10 MG tablet, Take 1 tablet (10 mg total) by mouth daily., Disp: 90 tablet, Rfl: 1 .  mometasone  (NASONEX) 50 MCG/ACT nasal spray, Place 2 sprays into the nose daily. (Patient taking differently: Place 2 sprays into the nose daily as needed (allergies). ), Disp: 17 g, Rfl: 6 .  Multiple Vitamin (MULTIVITAMIN WITH MINERALS) TABS tablet, Take 1 tablet by mouth daily., Disp: , Rfl:  .  sodium chloride (OCEAN) 0.65 % SOLN nasal spray, Place 1 spray into both nostrils as needed for congestion., Disp: 15 mL, Rfl: 0  EXAM:  Vitals:   01/26/16 0829  BP: 120/80  Pulse: 62  Temp: 98.2 F (36.8 C)    Body mass index is 24.47 kg/m.  GENERAL: vitals reviewed and listed above, alert, oriented, appears well hydrated and in no acute distress  HEENT: atraumatic, conjunttiva clear, no obvious abnormalities on inspection of external nose and ears  NECK: no obvious masses on inspection  LUNGS: clear to auscultation bilaterally, no wheezes, rales or rhonchi, good air movement  CV: HRRR, no peripheral edema  MS: moves all extremities without noticeable abnormality  PSYCH: pleasant and cooperative, no obvious depression or anxiety  ASSESSMENT AND PLAN:  Discussed the following assessment and plan:  Hyperlipidemia, unspecified hyperlipidemia type - Plan: Lipid Panel -lifestyle recs -tolerating statin well - will adjust pending lab results if needed  Essential hypertension -stable, advised pill box to assist  her in taking medication daily  Hyperglycemia -lifestyle recs - advised healthy diet, regular aerobic exercise  Colon cancer screening -counseled on options -she decided to do the cologuard test, advised assistant to order  Breast cancer screening -advised of options and she agreed to schedule her mammogram  -Patient advised to return or notify a doctor immediately if symptoms worsen or persist or new concerns arise.  Patient Instructions  BEFORE YOU LEAVE: -Wendie Simmer will help you order the cologuard test for colon cancer screening -follow up: 3 months -labs  Please call  today to schedule your mammogram.  Please complete the cologuard testing for colon cancer screening. Please contact us if you have not received the test kit in the next 2-3 weeks.  Continue your current medications.   We recommend the following healthy lifestyle for LIFE: 1) Small portions.   Tip: if you need more or a snack choose fruits, veggies and/or a handful of nuts or seeds.  2) Eat a healthy clean diet.  * Tip: Avoid (less then 1 serving per week): processed foods, sweets, sweetened drinks, white starches (rice, flour, bread, potatoes, pasta, etc), red meat, fast foods, butter  *Tip: CHOOSE instead   * 5-9 servings per day of fresh or frozen fruits and vegetables (but not corn, potatoes, bananas, canned or dried fruit)   *nuts and seeds, beans   *olives and olive oil   *small portions of lean meats such as fish and white chicken    *small portions of whole grains  3)Get at least 150 minutes of sweaty aerobic exercise per week.  4)Reduce stress - consider counseling, meditation and relaxation to balance other aspects of your life.     Colin Benton R., DO

## 2016-02-15 ENCOUNTER — Other Ambulatory Visit: Payer: 59

## 2016-03-07 DIAGNOSIS — M9903 Segmental and somatic dysfunction of lumbar region: Secondary | ICD-10-CM | POA: Diagnosis not present

## 2016-03-07 DIAGNOSIS — M9905 Segmental and somatic dysfunction of pelvic region: Secondary | ICD-10-CM | POA: Diagnosis not present

## 2016-03-07 DIAGNOSIS — M9902 Segmental and somatic dysfunction of thoracic region: Secondary | ICD-10-CM | POA: Diagnosis not present

## 2016-03-09 MED FILL — LISINOPRIL 10 MG TABLET: 10 | 90 days supply | Qty: 90 | Fill #1

## 2016-03-09 MED FILL — ANUCORT-HC 25 MG SUPP: 25 | 10 days supply | Qty: 20 | Fill #1

## 2016-03-09 MED FILL — ATORVASTATIN 20 MG TABLET: 20 | 90 days supply | Qty: 90 | Fill #1

## 2016-04-19 DIAGNOSIS — K603 Anal fistula: Secondary | ICD-10-CM | POA: Diagnosis not present

## 2016-04-19 DIAGNOSIS — K648 Other hemorrhoids: Secondary | ICD-10-CM | POA: Diagnosis not present

## 2016-05-03 ENCOUNTER — Ambulatory Visit: Payer: Self-pay | Admitting: General Surgery

## 2016-05-03 DIAGNOSIS — K603 Anal fistula: Secondary | ICD-10-CM | POA: Diagnosis not present

## 2016-05-03 DIAGNOSIS — K648 Other hemorrhoids: Secondary | ICD-10-CM | POA: Diagnosis not present

## 2016-05-23 ENCOUNTER — Ambulatory Visit: Payer: 59 | Admitting: Family Medicine

## 2016-06-06 ENCOUNTER — Encounter (HOSPITAL_BASED_OUTPATIENT_CLINIC_OR_DEPARTMENT_OTHER): Payer: Self-pay | Admitting: *Deleted

## 2016-06-08 ENCOUNTER — Encounter (HOSPITAL_BASED_OUTPATIENT_CLINIC_OR_DEPARTMENT_OTHER)
Admission: RE | Admit: 2016-06-08 | Discharge: 2016-06-08 | Disposition: A | Discharge status: Home / Self Care | Payer: 59 | Source: Ambulatory Visit | Attending: General Surgery | Admitting: General Surgery

## 2016-06-08 DIAGNOSIS — Z9102 Food additives allergy status: Secondary | ICD-10-CM | POA: Diagnosis not present

## 2016-06-08 DIAGNOSIS — K642 Third degree hemorrhoids: Secondary | ICD-10-CM | POA: Diagnosis not present

## 2016-06-08 DIAGNOSIS — G8929 Other chronic pain: Secondary | ICD-10-CM | POA: Diagnosis not present

## 2016-06-08 DIAGNOSIS — L720 Epidermal cyst: Secondary | ICD-10-CM | POA: Diagnosis not present

## 2016-06-08 DIAGNOSIS — L0231 Cutaneous abscess of buttock: Secondary | ICD-10-CM | POA: Diagnosis not present

## 2016-06-08 DIAGNOSIS — I10 Essential (primary) hypertension: Secondary | ICD-10-CM | POA: Diagnosis not present

## 2016-06-08 DIAGNOSIS — Z91013 Allergy to seafood: Secondary | ICD-10-CM | POA: Diagnosis not present

## 2016-06-08 DIAGNOSIS — Z01812 Encounter for preprocedural laboratory examination: Secondary | ICD-10-CM | POA: Diagnosis not present

## 2016-06-08 DIAGNOSIS — Z888 Allergy status to other drugs, medicaments and biological substances status: Secondary | ICD-10-CM | POA: Diagnosis not present

## 2016-06-08 DIAGNOSIS — K6289 Other specified diseases of anus and rectum: Secondary | ICD-10-CM | POA: Diagnosis not present

## 2016-06-08 DIAGNOSIS — K641 Second degree hemorrhoids: Secondary | ICD-10-CM | POA: Diagnosis not present

## 2016-06-08 DIAGNOSIS — Z79899 Other long term (current) drug therapy: Secondary | ICD-10-CM | POA: Diagnosis not present

## 2016-06-08 DIAGNOSIS — Z91018 Allergy to other foods: Secondary | ICD-10-CM | POA: Diagnosis not present

## 2016-06-08 LAB — CBC WITH DIFFERENTIAL/PLATELET
BASOS ABS: 0 10*3/uL (ref 0.0–0.1)
Basophils Relative: 0 %
EOS PCT: 2 %
Eosinophils Absolute: 0.1 10*3/uL (ref 0.0–0.7)
HCT: 36.5 % (ref 36.0–46.0)
Hemoglobin: 11.5 g/dL — ABNORMAL LOW (ref 12.0–15.0)
LYMPHS ABS: 1.6 10*3/uL (ref 0.7–4.0)
LYMPHS PCT: 26 %
MCH: 27.8 pg (ref 26.0–34.0)
MCHC: 31.5 g/dL (ref 30.0–36.0)
MCV: 88.4 fL (ref 78.0–100.0)
Monocytes Absolute: 0.6 10*3/uL (ref 0.1–1.0)
Monocytes Relative: 9 %
NEUTROS ABS: 3.9 10*3/uL (ref 1.7–7.7)
NEUTROS PCT: 63 %
PLATELETS: 290 10*3/uL (ref 150–400)
RBC: 4.13 MIL/uL (ref 3.87–5.11)
RDW: 13.8 % (ref 11.5–15.5)
WBC: 6.2 10*3/uL (ref 4.0–10.5)

## 2016-06-08 LAB — BASIC METABOLIC PANEL
ANION GAP: 7 (ref 5–15)
BUN: 15 mg/dL (ref 6–20)
CHLORIDE: 107 mmol/L (ref 101–111)
CO2: 27 mmol/L (ref 22–32)
Calcium: 9.4 mg/dL (ref 8.9–10.3)
Creatinine, Ser: 0.84 mg/dL (ref 0.44–1.00)
Glucose, Bld: 97 mg/dL (ref 65–99)
POTASSIUM: 3.9 mmol/L (ref 3.5–5.1)
SODIUM: 141 mmol/L (ref 135–145)

## 2016-06-08 NOTE — Progress Notes (Signed)
Patient here for labwork.  EKG machine not working. Will need to do DOS

## 2016-06-13 ENCOUNTER — Ambulatory Visit (HOSPITAL_BASED_OUTPATIENT_CLINIC_OR_DEPARTMENT_OTHER): Payer: 59 | Admitting: Certified Registered"

## 2016-06-13 ENCOUNTER — Other Ambulatory Visit: Payer: Self-pay

## 2016-06-13 ENCOUNTER — Encounter (HOSPITAL_BASED_OUTPATIENT_CLINIC_OR_DEPARTMENT_OTHER): Payer: Self-pay | Admitting: *Deleted

## 2016-06-13 ENCOUNTER — Encounter (HOSPITAL_BASED_OUTPATIENT_CLINIC_OR_DEPARTMENT_OTHER)
Admission: RE | Disposition: A | Discharge status: Home / Self Care | Payer: Self-pay | Source: Ambulatory Visit | Attending: General Surgery

## 2016-06-13 ENCOUNTER — Ambulatory Visit (HOSPITAL_BASED_OUTPATIENT_CLINIC_OR_DEPARTMENT_OTHER)
Admission: RE | Admit: 2016-06-13 | Discharge: 2016-06-13 | Disposition: A | Discharge status: Home / Self Care | Payer: 59 | Source: Ambulatory Visit | Attending: General Surgery | Admitting: General Surgery

## 2016-06-13 DIAGNOSIS — I10 Essential (primary) hypertension: Secondary | ICD-10-CM | POA: Diagnosis not present

## 2016-06-13 DIAGNOSIS — G8929 Other chronic pain: Secondary | ICD-10-CM | POA: Insufficient documentation

## 2016-06-13 DIAGNOSIS — L720 Epidermal cyst: Secondary | ICD-10-CM | POA: Insufficient documentation

## 2016-06-13 DIAGNOSIS — Z9102 Food additives allergy status: Secondary | ICD-10-CM | POA: Insufficient documentation

## 2016-06-13 DIAGNOSIS — L0231 Cutaneous abscess of buttock: Secondary | ICD-10-CM | POA: Diagnosis not present

## 2016-06-13 DIAGNOSIS — Z79899 Other long term (current) drug therapy: Secondary | ICD-10-CM | POA: Diagnosis not present

## 2016-06-13 DIAGNOSIS — K6289 Other specified diseases of anus and rectum: Secondary | ICD-10-CM | POA: Insufficient documentation

## 2016-06-13 DIAGNOSIS — K642 Third degree hemorrhoids: Secondary | ICD-10-CM | POA: Insufficient documentation

## 2016-06-13 DIAGNOSIS — K649 Unspecified hemorrhoids: Secondary | ICD-10-CM | POA: Diagnosis not present

## 2016-06-13 DIAGNOSIS — K641 Second degree hemorrhoids: Secondary | ICD-10-CM | POA: Diagnosis not present

## 2016-06-13 DIAGNOSIS — Z888 Allergy status to other drugs, medicaments and biological substances status: Secondary | ICD-10-CM | POA: Insufficient documentation

## 2016-06-13 DIAGNOSIS — L723 Sebaceous cyst: Secondary | ICD-10-CM | POA: Diagnosis not present

## 2016-06-13 DIAGNOSIS — Z91018 Allergy to other foods: Secondary | ICD-10-CM | POA: Insufficient documentation

## 2016-06-13 DIAGNOSIS — Z91013 Allergy to seafood: Secondary | ICD-10-CM | POA: Insufficient documentation

## 2016-06-13 HISTORY — PX: CYST EXCISION PERINEAL: SHX6278

## 2016-06-13 HISTORY — PX: EVALUATION UNDER ANESTHESIA WITH HEMORRHOIDECTOMY: SHX5624

## 2016-06-13 SURGERY — EXAM UNDER ANESTHESIA WITH HEMORRHOIDECTOMY
Anesthesia: General | Site: Rectum

## 2016-06-13 MED ORDER — CEFOTETAN DISODIUM-DEXTROSE 2-2.08 GM-% IV SOLR
INTRAVENOUS | Status: AC
  Filled 2016-06-13: qty 50

## 2016-06-13 MED ORDER — PROPOFOL 10 MG/ML IV BOLUS
INTRAVENOUS | Status: DC | PRN
  Administered 2016-06-13: 150 mg via INTRAVENOUS

## 2016-06-13 MED ORDER — LIDOCAINE 2% (20 MG/ML) 5 ML SYRINGE
INTRAMUSCULAR | Status: AC
  Filled 2016-06-13: qty 5

## 2016-06-13 MED ORDER — SUGAMMADEX SODIUM 200 MG/2ML IV SOLN
INTRAVENOUS | Status: AC
  Filled 2016-06-13: qty 2

## 2016-06-13 MED ORDER — SCOPOLAMINE 1 MG/3DAYS TD PT72: 1.0000 | MEDICATED_PATCH | Freq: Once | TRANSDERMAL | Status: DC | PRN

## 2016-06-13 MED ORDER — MIDAZOLAM HCL 2 MG/2ML IJ SOLN
1.0000 mg | INTRAMUSCULAR | Status: DC | PRN
  Administered 2016-06-13: 2 mg via INTRAVENOUS

## 2016-06-13 MED ORDER — FLEET ENEMA 7-19 GM/118ML RE ENEM: 1.0000 | ENEMA | Freq: Once | RECTAL | Status: DC

## 2016-06-13 MED ORDER — ONDANSETRON HCL 4 MG/2ML IJ SOLN
INTRAMUSCULAR | Status: DC | PRN
  Administered 2016-06-13: 4 mg via INTRAVENOUS

## 2016-06-13 MED ORDER — DEXTROSE 5 % IV SOLN
2.0000 g | INTRAVENOUS | Status: AC
  Administered 2016-06-13: 2 g via INTRAVENOUS

## 2016-06-13 MED ORDER — FENTANYL CITRATE (PF) 100 MCG/2ML IJ SOLN
50.0000 ug | INTRAMUSCULAR | Status: DC | PRN
  Administered 2016-06-13: 100 ug via INTRAVENOUS

## 2016-06-13 MED ORDER — MEPERIDINE HCL 25 MG/ML IJ SOLN: 6.2500 mg | INTRAMUSCULAR | Status: DC | PRN

## 2016-06-13 MED ORDER — DIBUCAINE 1 % RE OINT
TOPICAL_OINTMENT | RECTAL | Status: DC | PRN
  Administered 2016-06-13: 1 via RECTAL

## 2016-06-13 MED ORDER — BUPIVACAINE-EPINEPHRINE (PF) 0.5% -1:200000 IJ SOLN
INTRAMUSCULAR | Status: AC
  Filled 2016-06-13: qty 30

## 2016-06-13 MED ORDER — LIDOCAINE 2% (20 MG/ML) 5 ML SYRINGE
INTRAMUSCULAR | Status: DC | PRN
  Administered 2016-06-13: 60 mg via INTRAVENOUS

## 2016-06-13 MED ORDER — DEXAMETHASONE SODIUM PHOSPHATE 10 MG/ML IJ SOLN
INTRAMUSCULAR | Status: AC
  Filled 2016-06-13: qty 1

## 2016-06-13 MED ORDER — BUPIVACAINE LIPOSOME 1.3 % IJ SUSP
INTRAMUSCULAR | Status: DC | PRN
  Administered 2016-06-13: 9 mL

## 2016-06-13 MED ORDER — ONDANSETRON HCL 4 MG/2ML IJ SOLN
INTRAMUSCULAR | Status: AC
Start: 2016-06-13 — End: 2016-06-13
  Filled 2016-06-13: qty 2

## 2016-06-13 MED ORDER — ONDANSETRON HCL 4 MG/2ML IJ SOLN
INTRAMUSCULAR | Status: AC
  Filled 2016-06-13: qty 2

## 2016-06-13 MED ORDER — SUGAMMADEX SODIUM 200 MG/2ML IV SOLN
INTRAVENOUS | Status: DC | PRN
  Administered 2016-06-13: 200 mg via INTRAVENOUS

## 2016-06-13 MED ORDER — ONDANSETRON HCL 4 MG/2ML IJ SOLN
4.0000 mg | Freq: Once | INTRAMUSCULAR | Status: AC
  Administered 2016-06-13: 4 mg via INTRAVENOUS

## 2016-06-13 MED ORDER — MIDAZOLAM HCL 2 MG/2ML IJ SOLN
INTRAMUSCULAR | Status: AC
  Filled 2016-06-13: qty 2

## 2016-06-13 MED ORDER — BACITRACIN-NEOMYCIN-POLYMYXIN 400-5-5000 EX OINT
TOPICAL_OINTMENT | CUTANEOUS | Status: AC
  Filled 2016-06-13: qty 1

## 2016-06-13 MED ORDER — CHLORHEXIDINE GLUCONATE CLOTH 2 % EX PADS: 6.0000 | MEDICATED_PAD | Freq: Once | CUTANEOUS | Status: DC

## 2016-06-13 MED ORDER — DIBUCAINE 1 % RE OINT
TOPICAL_OINTMENT | RECTAL | Status: AC
  Filled 2016-06-13: qty 28

## 2016-06-13 MED ORDER — LACTATED RINGERS IV SOLN
INTRAVENOUS | Status: DC
  Administered 2016-06-13 (×2): via INTRAVENOUS

## 2016-06-13 MED ORDER — DEXAMETHASONE SODIUM PHOSPHATE 4 MG/ML IJ SOLN
INTRAMUSCULAR | Status: DC | PRN
  Administered 2016-06-13: 10 mg via INTRAVENOUS

## 2016-06-13 MED ORDER — ONDANSETRON HCL 4 MG/2ML IJ SOLN: 4.0000 mg | Freq: Once | INTRAMUSCULAR | Status: DC | PRN

## 2016-06-13 MED ORDER — ROCURONIUM 10MG/ML (10ML) SYRINGE FOR MEDFUSION PUMP - OPTIME
INTRAVENOUS | Status: DC | PRN
  Administered 2016-06-13: 50 mg via INTRAVENOUS

## 2016-06-13 MED ORDER — HYDROMORPHONE HCL 1 MG/ML IJ SOLN: 0.2500 mg | INTRAMUSCULAR | Status: DC | PRN

## 2016-06-13 MED ORDER — HYDROCODONE-ACETAMINOPHEN 5-325 MG PO TABS: 1.0000 | ORAL_TABLET | Freq: Four times a day (QID) | ORAL | 0 refills | Status: DC | PRN

## 2016-06-13 MED ORDER — FENTANYL CITRATE (PF) 100 MCG/2ML IJ SOLN
INTRAMUSCULAR | Status: AC
  Filled 2016-06-13: qty 2

## 2016-06-13 MED ORDER — BUPIVACAINE LIPOSOME 1.3 % IJ SUSP
INTRAMUSCULAR | Status: AC
  Filled 2016-06-13: qty 20

## 2016-06-13 MED ORDER — DOCUSATE SODIUM 100 MG PO CAPS: 100.0000 mg | ORAL_CAPSULE | Freq: Two times a day (BID) | ORAL | 0 refills | Status: DC

## 2016-06-13 MED FILL — HYDROCODON-APAP 5-325: 5-325 | 8 days supply | Qty: 30 | Fill #0

## 2016-06-13 SURGICAL SUPPLY — 55 items
APPLICATOR COTTON TIP 6IN STRL (MISCELLANEOUS) ×5 IMPLANT
BENZOIN TINCTURE PRP APPL 2/3 (GAUZE/BANDAGES/DRESSINGS) ×5 IMPLANT
BLADE SURG 15 STRL LF DISP TIS (BLADE) ×3 IMPLANT
BLADE SURG 15 STRL SS (BLADE) ×2
BRIEF STRETCH FOR OB PAD LRG (UNDERPADS AND DIAPERS) ×5 IMPLANT
CANISTER SUCT 1200ML W/VALVE (MISCELLANEOUS) ×5 IMPLANT
CLEANER CAUTERY TIP 5X5 PAD (MISCELLANEOUS) ×3 IMPLANT
CLIP TI MEDIUM 6 (CLIP) IMPLANT
CLIP TI WIDE RED SMALL 6 (CLIP) IMPLANT
COVER BACK TABLE 60X90IN (DRAPES) IMPLANT
COVER MAYO STAND STRL (DRAPES) ×5 IMPLANT
DECANTER SPIKE VIAL GLASS SM (MISCELLANEOUS) ×5 IMPLANT
DRAPE LAPAROTOMY 100X72 PEDS (DRAPES) IMPLANT
DRAPE UTILITY XL STRL (DRAPES) ×5 IMPLANT
DRSG PAD ABDOMINAL 8X10 ST (GAUZE/BANDAGES/DRESSINGS) ×5 IMPLANT
ELECT REM PT RETURN 9FT ADLT (ELECTROSURGICAL) ×5
ELECTRODE REM PT RTRN 9FT ADLT (ELECTROSURGICAL) ×3 IMPLANT
GAUZE PACKING IODOFORM 1/4X5 (PACKING) ×5 IMPLANT
GAUZE SPONGE 4X4 16PLY XRAY LF (GAUZE/BANDAGES/DRESSINGS) IMPLANT
GLOVE BIOGEL PI IND STRL 8 (GLOVE) ×3 IMPLANT
GLOVE BIOGEL PI INDICATOR 8 (GLOVE) ×2
GLOVE ECLIPSE 7.5 STRL STRAW (GLOVE) ×5 IMPLANT
GOWN STRL REUS W/ TWL LRG LVL3 (GOWN DISPOSABLE) ×3 IMPLANT
GOWN STRL REUS W/TWL LRG LVL3 (GOWN DISPOSABLE) ×2
LOOP VESSEL MAXI BLUE (MISCELLANEOUS) IMPLANT
NDL SAFETY ECLIPSE 18X1.5 (NEEDLE) IMPLANT
NEEDLE HYPO 18GX1.5 SHARP (NEEDLE)
NEEDLE HYPO 22GX1.5 SAFETY (NEEDLE) IMPLANT
NEEDLE HYPO 25X1 1.5 SAFETY (NEEDLE) ×5 IMPLANT
NS IRRIG 1000ML POUR BTL (IV SOLUTION) ×5 IMPLANT
PACK BASIN DAY SURGERY FS (CUSTOM PROCEDURE TRAY) ×5 IMPLANT
PACK LITHOTOMY IV (CUSTOM PROCEDURE TRAY) IMPLANT
PAD CLEANER CAUTERY TIP 5X5 (MISCELLANEOUS) ×2
PENCIL BUTTON HOLSTER BLD 10FT (ELECTRODE) ×5 IMPLANT
SPONGE GAUZE 4X4 12PLY STER LF (GAUZE/BANDAGES/DRESSINGS) ×5 IMPLANT
SPONGE HEMORRHOID 8X3CM (HEMOSTASIS) ×5 IMPLANT
SPONGE SURGIFOAM ABS GEL 100 (HEMOSTASIS) IMPLANT
SURGILUBE 2OZ TUBE FLIPTOP (MISCELLANEOUS) ×5 IMPLANT
SUT CHROMIC 3 0 PS 2 (SUTURE) ×10 IMPLANT
SUT CHROMIC 3 0 SH 27 (SUTURE) ×5 IMPLANT
SUT ETHILON 3 0 PS 1 (SUTURE) ×5 IMPLANT
SUT MNCRL AB 4-0 PS2 18 (SUTURE) IMPLANT
SUT VIC AB 3-0 SH 27 (SUTURE)
SUT VIC AB 3-0 SH 27X BRD (SUTURE) IMPLANT
SYR BULB 3OZ (MISCELLANEOUS) ×5 IMPLANT
SYR CONTROL 10ML LL (SYRINGE) ×5 IMPLANT
TAPE CLOTH 2X10 TAN LF (GAUZE/BANDAGES/DRESSINGS) ×5 IMPLANT
TAPE UMBILICAL 1/8 X36 TWILL (MISCELLANEOUS) IMPLANT
TOWEL OR 17X24 6PK STRL BLUE (TOWEL DISPOSABLE) ×10 IMPLANT
TOWEL OR NON WOVEN STRL DISP B (DISPOSABLE) ×5 IMPLANT
TRAY PROCTOSCOPIC FIBER OPTIC (SET/KITS/TRAYS/PACK) IMPLANT
TUBE CONNECTING 20'X1/4 (TUBING) ×1
TUBE CONNECTING 20X1/4 (TUBING) ×4 IMPLANT
UNDERPAD 30X30 (UNDERPADS AND DIAPERS) ×5 IMPLANT
YANKAUER SUCT BULB TIP NO VENT (SUCTIONS) ×5 IMPLANT

## 2016-06-13 NOTE — Anesthesia Procedure Notes (Signed)
Procedure Name: Intubation Date/Time: 06/13/2016 9:51 AM Performed by: Towanda Hornstein D Pre-anesthesia Checklist: Patient identified, Emergency Drugs available, Suction available and Patient being monitored Patient Re-evaluated:Patient Re-evaluated prior to inductionOxygen Delivery Method: Circle system utilized Preoxygenation: Pre-oxygenation with 100% oxygen Intubation Type: IV induction Ventilation: Mask ventilation without difficulty Laryngoscope Size: Mac and 3 Grade View: Grade I Tube type: Oral Tube size: 7.0 mm Number of attempts: 1 Airway Equipment and Method: Stylet and Oral airway Placement Confirmation: ETT inserted through vocal cords under direct vision,  positive ETCO2 and breath sounds checked- equal and bilateral Secured at: 21 cm Tube secured with: Tape Dental Injury: Teeth and Oropharynx as per pre-operative assessment

## 2016-06-13 NOTE — Op Note (Signed)
OPERATIVE REPORT  DATE OF OPERATION: 06/13/2016  PATIENT:  Alice Ryan  56 y.o. 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  Grade II hemorrhoid at the 0700 position.  Right gluteal abscess without fistulization.  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  INDICATION(S) FOR OPERATION:  Chronic pain and infection of the perirectal glutea; area and internal hemorrhoids  FINDINGS:  Grade III internal hemorrhoid at the 0200 position, Gade II hemorrhoid at the 0700 position, acute abscess with cavity at right posterior gluteal cheek, and chronic cyst/abscess at the similar spot on the left  PROCEDURE:  Procedure(s): EXAM UNDER ANESTHESIA, EXCISION OF CHRONIC CYST, SINGLE COLUMN HEMORRHOIDECTOMY AT THE 0200 POSITION, RUBBER BAND LIGATIONOF HEMORRHOID AT THE 0700 POSITION, I&d OF RIGHT GLUTEAL ABSCESS, AND EXCISION OF CHRONIC LEFT GLUTEAL ABSCESS ANAL FISTULOTOMY  SURGEON:  Surgeon(s): Jimmye NormanJames Yaneli Keithley, MD  ASSISTANT: None  ANESTHESIA:   general  COMPLICATIONS:  None  EBL: <20 ml  BLOOD ADMINISTERED: none  DRAINS: 1/4 inch Iodoform NuGauze packing of the right gluteal abscess   SPECIMEN:  Source of Specimen:  Excised cyst and hemorrhoide  COUNTS CORRECT:  YES  PROCEDURE DETAILS: The patient was taken to the operating room and placed initially on the table in the supine position. After an adequate general endotracheal anesthetic was administered, she was prepped and draped after placing her in the jackknife prone position. This was done in the usual manner.  A gluteal cheeks were taped apart. A proper timeout was performed identifying the patient and procedure to be performed.  We initially examined the anorectal mucosa using a large anoscope. A grade 3 hemorrhoid was noted at the 2:00 position which was excised using a 3-0 chromic  locking stitch to reapproximate the mucosa and control the bleeding.  A second grade 2 hemorrhoid was noted at the 7:00 position and was ligated with a rubber band.  We then paid attention to the abscess on the right side and the chronic infected cyst on the left side. We incised the abscess on the right side getting into a large pocket which is irrigated with saline solution. Pus was removed. We then packed it with nearly full bottle of quarter-inch iodoform Nu Gauze.  The infected chronically cyst on the left side was excised with an oval incision and reapproximated with 2 Loosely Pl. 2-0 nylon sutures.  I left the packing was left out of the abscess on the right side in order for it to be retrieved in a couple days.  10 mL of Exparel was injected in the perianal area. A sterile dressing was applied. All needle counts, sponge counts, and instrument counts were correct.  PATIENT DISPOSITION:  PACU - hemodynamically stable.   Remmie Bembenek 3/12/201810:52 AM

## 2016-06-13 NOTE — Anesthesia Postprocedure Evaluation (Signed)
Anesthesia Post Note  Patient: Wilmer FloorLisa A Burningham  Procedure(s) Performed: Procedure(s) (LRB): EXAM UNDER ANESTHESIA,  HEMORRHOIDECTOMY (N/A) CYST EXCISION PERIANAL, CHRONIC INFECTED (N/A)  Patient location during evaluation: PACU Anesthesia Type: General Level of consciousness: awake and alert Pain management: pain level controlled Vital Signs Assessment: post-procedure vital signs reviewed and stable Respiratory status: spontaneous breathing, nonlabored ventilation, respiratory function stable and patient connected to nasal cannula oxygen Cardiovascular status: blood pressure returned to baseline and stable Postop Assessment: no signs of nausea or vomiting Anesthetic complications: no       Last Vitals:  Vitals:   06/13/16 1107 06/13/16 1115  BP: 133/76 122/61  Pulse: 66 64  Resp: (!) 7 16  Temp: 36.4 C     Last Pain:  Vitals:   06/13/16 1107  TempSrc:   PainSc: 0-No pain                 Kannon Granderson DAVID

## 2016-06-13 NOTE — H&P (Signed)
  Alice Ryan 05/03/2016 11:57 AM Location: [facility] Patient #: [id number] DOB: 07/13/1960 Married / Language: English / Race: Black or African American Female   The patient is a 56 year old female.  Allergies Juanita Craver(Armen Glenn, CMA; 05/03/2016 12:09 PM) APPLE  FISH  Peanut Butter Flavor *PHARMACEUTICAL ADJUVANTS*  No Known Drug Allergies 05/03/2016 (Marked as Inactive) SHELLFISH   Medication History (Armen Glenn, CMA; 05/03/2016 12:10 PM) Multivitamin Adult (Oral) Active. Atorvastatin Calcium (20MG  Tablet, Oral) Active. Lisinopril (10MG  Tablet, Oral) Active. Medications Reconciled  Vitals (Armen Glenn CMA; 05/03/2016 12:06 PM) 05/03/2016 12:05 PM Weight: 129.5 lb Height: 61in Body Surface Area: 1.57 m Body Mass Index: 24.47 kg/m  Temp.: 98.9F  Pulse: 75 (Regular)  P.OX: 98% (Room air) BP: 140/86 (Sitting, Left Arm, Standard) BP today  157/51 P today  87  Physical Exam (Ethan Clayburn O. Britanie Harshman MD; 05/03/2016 12:18 PM) Rectal:  New inflamed growth on the opposite side of rectum.  Questionable infected cyst. Note: Based on her previous examination, the patient needs an examinatiion under anesthesia with removal of gluteal cyst, hemorrhoidectomy and possible fistulotomy.  Plan is EUA, I&D, possible hemorrhoidectomy and drainage of abscess.  Marta LamasJames O. Gae BonWyatt, III, MD, FACS [phone]--pager [phone]--office [facility]

## 2016-06-13 NOTE — Transfer of Care (Signed)
Immediate Anesthesia Transfer of Care Note  Patient: Alice Ryan  Procedure(s) Performed: Procedure(s): EXAM UNDER ANESTHESIA,  HEMORRHOIDECTOMY (N/A) CYST EXCISION PERIANAL, CHRONIC INFECTED (N/A)  Patient Location: PACU  Anesthesia Type:General  Level of Consciousness: sedated and responds to stimulation  Airway & Oxygen Therapy: Patient Spontanous Breathing and Patient connected to face mask oxygen  Post-op Assessment: Report given to RN and Post -op Vital signs reviewed and stable  Post vital signs: Reviewed and stable  Last Vitals:  Vitals:   06/13/16 0826  BP: (!) 157/51  Pulse: 67  Resp: 16  Temp: 36.8 C    Last Pain:  Vitals:   06/13/16 0826  TempSrc: Oral      Patients Stated Pain Goal: 3 (06/13/16 0826)  Complications: No apparent anesthesia complications

## 2016-06-13 NOTE — Discharge Instructions (Addendum)
Surgical Procedures for Hemorrhoids, Care After Refer to this sheet in the next few weeks. These instructions provide you with information about caring for yourself after your procedure. Your health care provider may also give you more specific instructions. Your treatment has been planned according to current medical practices, but problems sometimes occur. Call your health care provider if you have any problems or questions after your procedure. What can I expect after the procedure? After the procedure, it is common to have:  Rectal pain.  Pain when you are having a bowel movement.  Slight rectal bleeding.  I excised a perirectal cyst also and drained an abscess on the opposite side  The packing from the right sided abscess should be removed in two days, and should be done while soaking in a sitz bath.  Expect a long string of packing to be removed.  Start sitz baths after first bowel movement Follow these instructions at home: Medicines   Take over-the-counter and prescription medicines only as told by your health care provider.  Do not drive or operate heavy machinery while taking prescription pain medicine.  Use a stool softener or a bulk laxative as told by your health care provider. Activity   Rest at home. Return to your normal activities as told by your health care provider.  Do not lift anything that is heavier than 10 lb (4.5 kg).  Do not sit for long periods of time. Take a walk every day or as told by your health care provider.  Do not strain to have a bowel movement. Do not spend a long time sitting on the toilet. Eating and drinking   Eat foods that contain fiber, such as whole grains, beans, nuts, fruits, and vegetables.  Drink enough fluid to keep your urine clear or pale yellow. General instructions   Sit in a warm bath 2-3 times per day to relieve soreness or itching.  Keep all follow-up visits as told by your health care provider. This is  important. Contact a health care provider if:  Your pain medicine is not helping.  You have a fever or chills.  You become constipated.  You have trouble passing urine. Get help right away if:  You have very bad rectal pain.  You have heavy bleeding from your rectum. This information is not intended to replace advice given to you by your health care provider. Make sure you discuss any questions you have with your health care provider. Document Released: 06/11/2003 Document Revised: 08/27/2015 Document Reviewed: 06/16/2014 Elsevier Interactive Patient Education  2017 [employer].     Information for Discharge Teaching: EXPAREL (bupivacaine liposome injectable suspension)   Your surgeon gave you EXPAREL(bupivacaine) in your surgical incision to help control your pain after surgery.   EXPAREL is a local anesthetic that provides pain relief by numbing the tissue around the surgical site.  EXPAREL is designed to release pain medication over time and can control pain for up to 72 hours.  Depending on how you respond to EXPAREL, you may require less pain medication during your recovery.  Possible side effects:  Temporary loss of sensation or ability to move in the area where bupivacaine was injected.  Nausea, vomiting, constipation  Rarely, numbness and tingling in your mouth or lips, lightheadedness, or anxiety may occur.  Call your doctor right away if you think you may be experiencing any of these sensations, or if you have other questions regarding possible side effects.  Follow all other discharge instructions given to you  by your surgeon or nurse. Eat a healthy diet and drink plenty of water or other fluids.  If you return to the hospital for any reason within 96 hours following the administration of EXPAREL, please inform your health care providers.      Post Anesthesia Home Care Instructions  Activity: Get plenty of rest for the remainder of the day. A  responsible adult should stay with you for 24 hours following the procedure.  For the next 24 hours, DO NOT: -Drive a car -Advertising copywriterperate machinery -Drink alcoholic beverages -Take any medication unless instructed by your physician -Make any legal decisions or sign important papers.  Meals: Start with liquid foods such as gelatin or soup. Progress to regular foods as tolerated. Avoid greasy, spicy, heavy foods. If nausea and/or vomiting occur, drink only clear liquids until the nausea and/or vomiting subsides. Call your physician if vomiting continues.  Special Instructions/Symptoms: Your throat may feel dry or sore from the anesthesia or the breathing tube placed in your throat during surgery. If this causes discomfort, gargle with warm salt water. The discomfort should disappear within 24 hours.  If you had a scopolamine patch placed behind your ear for the management of post- operative nausea and/or vomiting:  1. The medication in the patch is effective for 72 hours, after which it should be removed.  Wrap patch in a tissue and discard in the trash. Wash hands thoroughly with soap and water. 2. You may remove the patch earlier than 72 hours if you experience unpleasant side effects which may include dry mouth, dizziness or visual disturbances. 3. Avoid touching the patch. Wash your hands with soap and water after contact with the patch.

## 2016-06-13 NOTE — Anesthesia Preprocedure Evaluation (Addendum)
Anesthesia Evaluation  Patient identified by MRN, date of birth, ID band Patient awake    Reviewed: Allergy & Precautions, NPO status , Patient's Chart, lab work & pertinent test results  Airway Mallampati: I  TM Distance: >3 FB Neck ROM: Full    Dental   Pulmonary    Pulmonary exam normal        Cardiovascular hypertension, Pt. on medications Normal cardiovascular exam     Neuro/Psych    GI/Hepatic GERD  Medicated,  Endo/Other    Renal/GU      Musculoskeletal   Abdominal   Peds  Hematology   Anesthesia Other Findings   Reproductive/Obstetrics                            Anesthesia Physical Anesthesia Plan  ASA: II  Anesthesia Plan: General   Post-op Pain Management:    Induction: Intravenous  Airway Management Planned: LMA  Additional Equipment:   Intra-op Plan:   Post-operative Plan: Extubation in OR  Informed Consent: I have reviewed the patients History and Physical, chart, labs and discussed the procedure including the risks, benefits and alternatives for the proposed anesthesia with the patient or authorized representative who has indicated his/her understanding and acceptance.     Plan Discussed with: CRNA and Surgeon  Anesthesia Plan Comments:         Anesthesia Quick Evaluation

## 2016-06-14 ENCOUNTER — Encounter (HOSPITAL_BASED_OUTPATIENT_CLINIC_OR_DEPARTMENT_OTHER): Payer: Self-pay | Admitting: General Surgery

## 2016-06-28 ENCOUNTER — Encounter: Payer: Self-pay | Admitting: Family Medicine

## 2016-06-28 ENCOUNTER — Ambulatory Visit: Payer: 59 | Admitting: Family Medicine

## 2016-06-28 ENCOUNTER — Ambulatory Visit (INDEPENDENT_AMBULATORY_CARE_PROVIDER_SITE_OTHER): Payer: 59 | Admitting: Family Medicine

## 2016-06-28 VITALS — BP 132/80 | HR 62 | Temp 97.9°F | Ht 61.0 in | Wt 126.9 lb

## 2016-06-28 DIAGNOSIS — B001 Herpesviral vesicular dermatitis: Secondary | ICD-10-CM

## 2016-06-28 MED ORDER — VALACYCLOVIR HCL 1 G PO TABS: ORAL_TABLET | ORAL | 3 refills | Status: DC

## 2016-06-28 MED FILL — valACYclovir HCL 1 GM TABS: 1 | 1 days supply | Qty: 4 | Fill #0

## 2016-06-28 NOTE — Progress Notes (Signed)
HPI:  Acute visit for Cold Sores: -has had for many years, dx in the past -1-2 outbreaks per year -this started 3 days ago, more severe then most, OTC helping some, but used valtrex in the past and wonders if should use  ROS: See pertinent positives and negatives per HPI.  Past Medical History:  Diagnosis Date  . Acid reflux   . Blood in stool   . GERD (gastroesophageal reflux disease)   . Hypercholesteremia   . Hyperlipidemia   . Hypertension   . Hypertension   . Kidney stones     Past Surgical History:  Procedure Laterality Date  . CYST EXCISION PERINEAL N/A 06/13/2016   Procedure: CYST EXCISION PERIANAL, CHRONIC INFECTED;  Surgeon: Jimmye NormanJames Wyatt, MD;  Location: [facility];  Service: General;  Laterality: N/A;  . EVALUATION UNDER ANESTHESIA WITH HEMORRHOIDECTOMY N/A 06/13/2016   Procedure: EXAM UNDER ANESTHESIA,  HEMORRHOIDECTOMY;  Surgeon: Jimmye NormanJames Wyatt, MD;  Location: [facility];  Service: General;  Laterality: N/A;  . lumpectomy right breast  2011   benign    Family History  Problem Relation Age of Onset  . Cancer Mother     breast cancer at age 54  . Stroke Mother   . Hypertension Mother   . Hypertension Father     Social History   Social History  . Marital status: Married    Spouse name: N/A  . Number of children: N/A  . Years of education: N/A   Social History Main Topics  . Smoking status: Never Smoker  . Smokeless tobacco: Never Used  . Alcohol use Yes     Comment: social  . Drug use: No  . Sexual activity: Not Asked   Other Topics Concern  . None   Social History Narrative  . None     Current Outpatient Prescriptions:  .  atorvastatin (LIPITOR) 20 MG tablet, Take 1 tablet (20 mg total) by mouth daily., Disp: 90 tablet, Rfl: 1 .  docusate sodium (COLACE) 100 MG capsule, Take 1 capsule (100 mg total) by mouth 2 (two) times daily., Disp: 20 capsule, Rfl: 0 .  HYDROcodone-acetaminophen (NORCO/VICODIN) 5-325 MG tablet,  Take 1 tablet by mouth every 6 (six) hours as needed for moderate pain., Disp: 30 tablet, Rfl: 0 .  lisinopril (PRINIVIL,ZESTRIL) 10 MG tablet, Take 1 tablet (10 mg total) by mouth daily., Disp: 90 tablet, Rfl: 1 .  mometasone (NASONEX) 50 MCG/ACT nasal spray, Place 2 sprays into the nose daily. (Patient taking differently: Place 2 sprays into the nose daily as needed (allergies). ), Disp: 17 g, Rfl: 6 .  Multiple Vitamin (MULTIVITAMIN WITH MINERALS) TABS tablet, Take 1 tablet by mouth daily., Disp: , Rfl:  .  valACYclovir (VALTREX) 1000 MG tablet, 2000mg  (2 tabs) twice daily for 1 day. Take at fist signs of outbreak., Disp: 4 tablet, Rfl: 3  EXAM:  Vitals:   06/28/16 1410  BP: 132/80  Pulse: 62  Temp: 97.9 F (36.6 C)    Body mass index is 23.98 kg/m.  GENERAL: vitals reviewed and listed above, alert, oriented, appears well hydrated and in no acute distress  HEENT: atraumatic, conjunttiva clear, no obvious abnormalities on inspection of external nose and ears, several blisters on lips  NECK: no obvious masses on inspection  MS: moves all extremities without noticeable abnormality  PSYCH: pleasant and cooperative, no obvious depression or anxiety  ASSESSMENT AND PLAN:  Discussed the following assessment and plan:  Recurrent cold sores  -offered viral  culture to confirm dx, she opted against -offered work note, she wishes to get back to work -valtrex with refills for episodic tx, follow up for CPE -Patient advised to return or notify a doctor immediately if symptoms worsen or persist or new concerns arise.  Patient Instructions  BEFORE YOU LEAVE: -follow up: physical exam in next 2-3 months  Start the valtrex today asap - take two tablets now, then repeat in 12 hours  I hope you are feeling better soon! Seek care immediately if worsening, new concerns or you are not improving with treatment.      Alice Ryan R., DO

## 2016-06-28 NOTE — Patient Instructions (Signed)
BEFORE YOU LEAVE: -follow up: physical exam in next 2-3 months  Start the valtrex today asap - take two tablets now, then repeat in 12 hours  I hope you are feeling better soon! Seek care immediately if worsening, new concerns or you are not improving with treatment.

## 2016-06-28 NOTE — Progress Notes (Signed)
Pre visit review using our clinic review tool, if applicable. No additional management support is needed unless otherwise documented below in the visit note. 

## 2016-07-19 ENCOUNTER — Telehealth: Payer: Self-pay | Admitting: Family Medicine

## 2016-07-19 MED ORDER — MOMETASONE FUROATE 50 MCG/ACT NA SUSP: 2.0000 | Freq: Every day | NASAL | 5 refills | Status: DC

## 2016-07-19 MED FILL — MOMETASONE FUROATE 50 MCG S: 50 | 30 days supply | Qty: 17 | Fill #0

## 2016-07-19 NOTE — Telephone Encounter (Signed)
Ok to send

## 2016-07-19 NOTE — Telephone Encounter (Signed)
Rx done. 

## 2016-07-19 NOTE — Telephone Encounter (Signed)
Pt would like a refill on nasonex send to [facility]

## 2016-07-21 ENCOUNTER — Ambulatory Visit: Payer: 59 | Admitting: Family Medicine

## 2016-08-12 ENCOUNTER — Other Ambulatory Visit: Payer: Self-pay | Admitting: Family Medicine

## 2016-08-12 MED FILL — ATORVASTATIN 20 MG TABLET: 20 | 90 days supply | Qty: 90 | Fill #0

## 2016-08-15 MED FILL — LISINOPRIL 10 MG TABLET: 10 | 90 days supply | Qty: 90 | Fill #0

## 2016-09-02 NOTE — Progress Notes (Signed)
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  Past Medical History:  Diagnosis Date  . Acid reflux   . Blood in stool   . GERD (gastroesophageal reflux disease)   . Hypercholesteremia   . Hyperlipidemia   . Hypertension   . Hypertension   . Kidney stones     Past Surgical History:  Procedure Laterality Date  . CYST EXCISION PERINEAL N/A 06/13/2016   Procedure: CYST EXCISION PERIANAL, CHRONIC INFECTED;  Surgeon: Jimmye Norman, MD;  Location: [facility];  Service: General;  Laterality: N/A;  . EVALUATION UNDER ANESTHESIA WITH HEMORRHOIDECTOMY N/A 06/13/2016   Procedure: EXAM UNDER ANESTHESIA,  HEMORRHOIDECTOMY;  Surgeon: Jimmye Norman, MD;  Location: [facility];  Service: General;  Laterality: N/A;  . lumpectomy right breast  2011   benign    Family History  Problem Relation Age of Onset  . Cancer Mother        breast cancer at age 4  . Stroke Mother   . Hypertension Mother   . Hypertension Father     Social History   Social History  . Marital status: Married    Spouse name: N/A  . Number of children: N/A  . Years of education: N/A   Social History Main Topics  . Smoking status: Never Smoker  . Smokeless tobacco: Never Used  . Alcohol use Yes     Comment: social  . Drug use: No  . Sexual activity: Not Asked   Other Topics Concern  . None   Social History Narrative  . None     Current Outpatient Prescriptions:  .  atorvastatin (LIPITOR) 20 MG tablet, TAKE 1 TABLET (20 MG TOTAL) BY MOUTH DAILY., Disp: 90 tablet, Rfl: 0 .  docusate sodium (COLACE) 100 MG capsule, Take 1 capsule (100  mg total) by mouth 2 (two) times daily., Disp: 20 capsule, Rfl: 0 .  HYDROcodone-acetaminophen (NORCO/VICODIN) 5-325 MG tablet, Take 1 tablet by mouth every 6 (six) hours as needed for moderate pain., Disp: 30 tablet, Rfl: 0 .  mometasone (NASONEX) 50 MCG/ACT nasal spray, Place 2 sprays into the nose daily., Disp: 17 g, Rfl: 5 .  Multiple Vitamin (MULTIVITAMIN WITH MINERALS) TABS tablet, Take 1 tablet by mouth daily., Disp: , Rfl:  .  valACYclovir (VALTREX) 1000 MG tablet, 2000mg  (2 tabs) twice daily for 1 day. Take at fist signs of outbreak., Disp: 4 tablet, Rfl: 3 .  lisinopril-hydrochlorothiazide (PRINZIDE,ZESTORETIC) 10-12.5 MG tablet, Take 1 tablet by mouth daily., Disp: 30 tablet, Rfl: 3  EXAM:  Vitals:   09/05/16 0857 09/05/16 0912  BP: 140/80 140/90  Pulse: 64   Temp: 98 F (36.7 C)     Body mass index is 23.69 kg/m.  GENERAL: vitals reviewed and listed above, alert, oriented, appears well hydrated and in no acute distress  HEENT: atraumatic, conjunttiva clear, no obvious abnormalities on inspection of external nose and ears  NECK: no obvious masses on inspection  LUNGS: clear to auscultation bilaterally, no wheezes, rales or rhonchi, good air movement  CV: HRRR, no peripheral edema  MS: moves all extremities without noticeable abnormality, on inspection of hands/wrists/forearms she has bilat prominent flexor deformity  of the 5th digits, also tense flexors of the 2-4th digits with abnormal ext, some signs of OA, no weakness or locking, NV intact  PSYCH: pleasant and cooperative, no obvious depression or anxiety  ASSESSMENT AND PLAN:  Discussed the following assessment and plan:  Essential hypertension Elevated blood pressure reading -will go ahead anc increase anti-hypertensive regimen, she opted to try acei/diuretic combo after discussion options -will recheck at CPE in 4-6 weeks -lifestyle recs - she reports is eating healthy and exercising  Pain in both  hands -opted for revisit with her hand specialist, she agrees to schedule as already plugged in, advise she call here if any trouble getting appt and we will try to assist  -Patient advised to return or notify a doctor immediately if symptoms worsen or persist or new concerns arise.  Patient Instructions  BEFORE YOU LEAVE: -follow up: schedule physical in 4-6 weeks  Schedule an appointment with your hand specialist.  Stop the lisinopril.  Start the new combo blood pressure medication  Advise regular aerobic exercise (at least 150 minutes per week of sweaty exercise) and a healthy diet. Try to eat at least 5-9 servings of vegetables and fruits per day (not corn, potatoes or bananas.) Avoid sweets, red meat, pork, butter, fried foods, fast food, processed food, excessive dairy, eggs and coconut. Replace bad fats with good fats - fish, nuts and seeds, canola oil, olive oil.     Kriste BasqueKIM, Dellia Donnelly R., DO

## 2016-09-05 ENCOUNTER — Ambulatory Visit (INDEPENDENT_AMBULATORY_CARE_PROVIDER_SITE_OTHER): Payer: 59 | Admitting: Family Medicine

## 2016-09-05 ENCOUNTER — Encounter: Payer: Self-pay | Admitting: Family Medicine

## 2016-09-05 VITALS — BP 140/90 | HR 64 | Temp 98.0°F | Ht 61.0 in | Wt 125.4 lb

## 2016-09-05 DIAGNOSIS — M79641 Pain in right hand: Secondary | ICD-10-CM | POA: Diagnosis not present

## 2016-09-05 DIAGNOSIS — M79642 Pain in left hand: Secondary | ICD-10-CM | POA: Diagnosis not present

## 2016-09-05 DIAGNOSIS — I10 Essential (primary) hypertension: Secondary | ICD-10-CM

## 2016-09-05 DIAGNOSIS — R030 Elevated blood-pressure reading, without diagnosis of hypertension: Secondary | ICD-10-CM

## 2016-09-05 MED ORDER — LISINOPRIL-HYDROCHLOROTHIAZIDE 10-12.5 MG PO TABS: 1.0000 | ORAL_TABLET | Freq: Every day | ORAL | 3 refills | Status: DC

## 2016-09-05 MED FILL — LISINOPRIL-HCTZ 10-12.5 MG: 10-12.5 | 30 days supply | Qty: 30 | Fill #0

## 2016-09-05 NOTE — Patient Instructions (Signed)
BEFORE YOU LEAVE: -follow up: schedule physical in 4-6 weeks  Schedule an appointment with your hand specialist.  Stop the lisinopril.  Start the new combo blood pressure medication  Advise regular aerobic exercise (at least 150 minutes per week of sweaty exercise) and a healthy diet. Try to eat at least 5-9 servings of vegetables and fruits per day (not corn, potatoes or bananas.) Avoid sweets, red meat, pork, butter, fried foods, fast food, processed food, excessive dairy, eggs and coconut. Replace bad fats with good fats - fish, nuts and seeds, canola oil, olive oil.

## 2016-09-16 MED FILL — valACYclovir HCL 1 GM TABS: 1 | 1 days supply | Qty: 4 | Fill #1

## 2016-10-03 DIAGNOSIS — M65341 Trigger finger, right ring finger: Secondary | ICD-10-CM | POA: Diagnosis not present

## 2016-10-21 MED FILL — valACYclovir HCL 1 GM TABS: 1 | 1 days supply | Qty: 4 | Fill #2

## 2016-11-12 ENCOUNTER — Other Ambulatory Visit: Payer: Self-pay

## 2016-11-12 ENCOUNTER — Emergency Department (HOSPITAL_COMMUNITY): Payer: 59

## 2016-11-12 ENCOUNTER — Encounter (HOSPITAL_COMMUNITY): Payer: Self-pay | Admitting: Emergency Medicine

## 2016-11-12 ENCOUNTER — Emergency Department (HOSPITAL_COMMUNITY)
Admission: EM | Admit: 2016-11-12 | Discharge: 2016-11-12 | Disposition: A | Discharge status: Home / Self Care | Payer: 59 | Attending: Emergency Medicine | Admitting: Emergency Medicine

## 2016-11-12 DIAGNOSIS — Z79899 Other long term (current) drug therapy: Secondary | ICD-10-CM | POA: Insufficient documentation

## 2016-11-12 DIAGNOSIS — R072 Precordial pain: Secondary | ICD-10-CM | POA: Insufficient documentation

## 2016-11-12 DIAGNOSIS — R0789 Other chest pain: Secondary | ICD-10-CM | POA: Diagnosis not present

## 2016-11-12 DIAGNOSIS — E785 Hyperlipidemia, unspecified: Secondary | ICD-10-CM | POA: Insufficient documentation

## 2016-11-12 DIAGNOSIS — I10 Essential (primary) hypertension: Secondary | ICD-10-CM | POA: Insufficient documentation

## 2016-11-12 DIAGNOSIS — R079 Chest pain, unspecified: Secondary | ICD-10-CM | POA: Diagnosis present

## 2016-11-12 DIAGNOSIS — E876 Hypokalemia: Secondary | ICD-10-CM

## 2016-11-12 LAB — CBC
HCT: 37.4 % (ref 36.0–46.0)
Hemoglobin: 12.2 g/dL (ref 12.0–15.0)
MCH: 28.1 pg (ref 26.0–34.0)
MCHC: 32.6 g/dL (ref 30.0–36.0)
MCV: 86.2 fL (ref 78.0–100.0)
PLATELETS: 351 10*3/uL (ref 150–400)
RBC: 4.34 MIL/uL (ref 3.87–5.11)
RDW: 14.2 % (ref 11.5–15.5)
WBC: 7.3 10*3/uL (ref 4.0–10.5)

## 2016-11-12 LAB — BASIC METABOLIC PANEL
Anion gap: 10 (ref 5–15)
BUN: 14 mg/dL (ref 6–20)
CALCIUM: 9.5 mg/dL (ref 8.9–10.3)
CO2: 24 mmol/L (ref 22–32)
Chloride: 106 mmol/L (ref 101–111)
Creatinine, Ser: 0.89 mg/dL (ref 0.44–1.00)
GFR calc Af Amer: >60 mL/min (ref >60)
Glucose, Bld: 124 mg/dL — ABNORMAL HIGH (ref 65–99)
Potassium: 3.4 mmol/L — ABNORMAL LOW (ref 3.5–5.1)
SODIUM: 140 mmol/L (ref 135–145)

## 2016-11-12 LAB — I-STAT TROPONIN, ED: TROPONIN I, POC: 0.01 ng/mL (ref 0.00–0.08)

## 2016-11-12 MED ORDER — POTASSIUM CHLORIDE CRYS ER 20 MEQ PO TBCR
40.0000 meq | EXTENDED_RELEASE_TABLET | Freq: Once | ORAL | Status: AC
  Administered 2016-11-12: 40 meq via ORAL
  Filled 2016-11-12: qty 2

## 2016-11-12 NOTE — ED Triage Notes (Signed)
Pt c/o intermittent chest discomfort x a few days, denies radiation, no shortness of breath, n/v, diaphoresis. States today she felt as though her heart was racing.

## 2016-11-12 NOTE — ED Provider Notes (Signed)
[facility]-EMERGENCY DEPT Provider Note   CSN: [id number] Arrival date & time: 11/12/16  1748     History   Chief Complaint Chief Complaint  Patient presents with  . Chest Pain    HPI Alice Ryan is a 56 y.o. female.  Patient c/o intermittent chest pain for the past 2-3 days. Pain mid chest, 'almost like indigestion', lasts seconds to up to 30 minutes.  No relation to activity or exertion. Exercised today, but pain did not bother her during exercise. No nv, diaphoresis, sob, unusual doe or fatigue. No pleuritic pain. No cough. No sore throat. No fever or chills. No leg pain or swelling. Hx htn. Non smoker. No  fam hx cad. No recent surgery (since hemorrhoid surgery in April), trauma, immobility. No hx dvt or pe.    The history is provided by the patient.  Chest Pain   Pertinent negatives include no abdominal pain, no back pain, no fever, no headaches and no shortness of breath.    Past Medical History:  Diagnosis Date  . Acid reflux   . Blood in stool   . GERD (gastroesophageal reflux disease)   . Hypercholesteremia   . Hyperlipidemia   . Hypertension   . Hypertension   . Kidney stones     Patient Active Problem List   Diagnosis Date Noted  . Hyperglycemia 06/30/2015  . Hyperlipemia 06/30/2015  . Essential hypertension 07/10/2014  . Allergic rhinitis 07/10/2014    Past Surgical History:  Procedure Laterality Date  . CYST EXCISION PERINEAL N/A 06/13/2016   Procedure: CYST EXCISION PERIANAL, CHRONIC INFECTED;  Surgeon: Jimmye NormanJames Wyatt, MD;  Location: [facility];  Service: General;  Laterality: N/A;  . EVALUATION UNDER ANESTHESIA WITH HEMORRHOIDECTOMY N/A 06/13/2016   Procedure: EXAM UNDER ANESTHESIA,  HEMORRHOIDECTOMY;  Surgeon: Jimmye NormanJames Wyatt, MD;  Location: [facility];  Service: General;  Laterality: N/A;  . lumpectomy right breast  2011   benign    OB History    No data available       Home Medications    Prior to Admission  medications   Medication Sig Start Date End Date Taking? Authorizing Provider  atorvastatin (LIPITOR) 20 MG tablet TAKE 1 TABLET (20 MG TOTAL) BY MOUTH DAILY. 08/12/16   Terressa KoyanagiKim, Hannah R, DO  docusate sodium (COLACE) 100 MG capsule Take 1 capsule (100 mg total) by mouth 2 (two) times daily. 06/13/16   Jimmye NormanWyatt, James, MD  HYDROcodone-acetaminophen (NORCO/VICODIN) 5-325 MG tablet Take 1 tablet by mouth every 6 (six) hours as needed for moderate pain. 06/13/16   Jimmye NormanWyatt, James, MD  lisinopril-hydrochlorothiazide (PRINZIDE,ZESTORETIC) 10-12.5 MG tablet Take 1 tablet by mouth daily. 09/05/16   Terressa KoyanagiKim, Hannah R, DO  mometasone (NASONEX) 50 MCG/ACT nasal spray Place 2 sprays into the nose daily. 07/19/16   Terressa KoyanagiKim, Hannah R, DO  Multiple Vitamin (MULTIVITAMIN WITH MINERALS) TABS tablet Take 1 tablet by mouth daily.    [provider]  valACYclovir (VALTREX) 1000 MG tablet 2000mg  (2 tabs) twice daily for 1 day. Take at fist signs of outbreak. 06/28/16   Terressa KoyanagiKim, Hannah R, DO    Family History Family History  Problem Relation Age of Onset  . Cancer Mother        breast cancer at age 54  . Stroke Mother   . Hypertension Mother   . Hypertension Father     Social History Social History  Substance Use Topics  . Smoking status: Never Smoker  . Smokeless tobacco: Never Used  .  Alcohol use Yes     Comment: social     Allergies   Apple; Flounder [fish allergy]; Percocet [oxycodone-acetaminophen]; and Peanut butter flavor   Review of Systems Review of Systems  Constitutional: Negative for fever.  HENT: Negative for sore throat.   Eyes: Negative for redness.  Respiratory: Negative for shortness of breath.   Cardiovascular: Positive for chest pain. Negative for leg swelling.  Gastrointestinal: Negative for abdominal pain.  Genitourinary: Negative for flank pain.  Musculoskeletal: Negative for back pain.  Skin: Negative for rash.  Neurological: Negative for headaches.  Hematological: Does not bruise/bleed  easily.  Psychiatric/Behavioral: Negative for confusion.     Physical Exam Updated Vital Signs BP (!) 172/68 (BP Location: Left Arm)   Pulse 75   Temp 99 F (37.2 C) (Oral)   Resp 18   Ht 1.549 m (5\' 1" )   Wt 54.9 kg (121 lb)   LMP 09/02/2013   SpO2 100%   BMI 22.86 kg/m   Physical Exam  Constitutional: She appears well-developed and well-nourished. No distress.  HENT:  Mouth/Throat: Oropharynx is clear and moist.  Eyes: Conjunctivae are normal. No scleral icterus.  Neck: Neck supple. No tracheal deviation present.  Cardiovascular: Normal rate, regular rhythm, normal heart sounds and intact distal pulses.  Exam reveals no gallop and no friction rub.   No murmur heard. Pulmonary/Chest: Effort normal and breath sounds normal. No respiratory distress.  Abdominal: Soft. Normal appearance and bowel sounds are normal. She exhibits no distension. There is no tenderness.  Musculoskeletal: She exhibits no edema or tenderness.  Neurological: She is alert.  Skin: Skin is warm and dry. No rash noted. She is not diaphoretic.  Psychiatric: She has a normal mood and affect.  Nursing note and vitals reviewed.    ED Treatments / Results  Labs (all labs ordered are listed, but only abnormal results are displayed) Results for orders placed or performed during the hospital encounter of 11/12/16  Basic metabolic panel  Result Value Ref Range   Sodium 140 135 - 145 mmol/L   Potassium 3.4 (L) 3.5 - 5.1 mmol/L   Chloride 106 101 - 111 mmol/L   CO2 24 22 - 32 mmol/L   Glucose, Bld 124 (H) 65 - 99 mg/dL   BUN 14 6 - 20 mg/dL   Creatinine, Ser 1.61 0.44 - 1.00 mg/dL   Calcium 9.5 8.9 - 09.6 mg/dL   GFR calc non Af Amer >60 >60 mL/min   GFR calc Af Amer >60 >60 mL/min   Anion gap 10 5 - 15  CBC  Result Value Ref Range   WBC 7.3 4.0 - 10.5 K/uL   RBC 4.34 3.87 - 5.11 MIL/uL   Hemoglobin 12.2 12.0 - 15.0 g/dL   HCT 04.5 40.9 - 81.1 %   MCV 86.2 78.0 - 100.0 fL   MCH 28.1 26.0 - 34.0 pg    MCHC 32.6 30.0 - 36.0 g/dL   RDW 91.4 78.2 - 95.6 %   Platelets 351 150 - 400 K/uL  I-stat troponin, ED  Result Value Ref Range   Troponin i, poc 0.01 0.00 - 0.08 ng/mL   Comment 3           Dg Chest 2 View  Result Date: 11/12/2016 CLINICAL DATA:  Intermittent central chest discomfort for a few days, history hypertension EXAM: CHEST  2 VIEW COMPARISON:  03/12/2008 FINDINGS: Normal heart size, mediastinal contours, and pulmonary vascularity. Lungs clear. No pleural effusion or pneumothorax. Bones unremarkable. IMPRESSION:  Normal exam. Electronically Signed   By: Ulyses Southward M.D.   On: 11/12/2016 18:23    ED ECG REPORT   Date: 11/12/2016  Rate: 53  Rhythm: normal sinus rhythm  QRS Axis: normal  Intervals: normal  ST/T Wave abnormalities: nonspecific T wave changes  Conduction Disutrbances:none  Narrative Interpretation:   Old EKG Reviewed: none available  I have personally reviewed the EKG tracing   Radiology Dg Chest 2 View  Result Date: 11/12/2016 CLINICAL DATA:  Intermittent central chest discomfort for a few days, history hypertension EXAM: CHEST  2 VIEW COMPARISON:  03/12/2008 FINDINGS: Normal heart size, mediastinal contours, and pulmonary vascularity. Lungs clear. No pleural effusion or pneumothorax. Bones unremarkable. IMPRESSION: Normal exam. Electronically Signed   By: Ulyses Southward M.D.   On: 11/12/2016 18:23    Procedures Procedures (including critical care time)  Medications Ordered in ED Medications - No data to display   Initial Impression / Assessment and Plan / ED Course  I have reviewed the triage vital signs and the nursing notes.  Pertinent labs & imaging results that were available during my care of the patient were reviewed by me and considered in my medical decision making (see chart for details).  Labs.   Symptoms atypical/felt not c/w acs.  kcl po.  Reviewed nursing notes and prior charts for additional history.    pt currently cp free and  appears stable for d/c.     Final Clinical Impressions(s) / ED Diagnoses   Final diagnoses:  None    New Prescriptions New Prescriptions   No medications on file     Cathren Laine, MD 11/12/16 2106

## 2016-11-12 NOTE — Discharge Instructions (Signed)
It was our pleasure to provide your ER care today - we hope that you feel better.  If reflux symptoms, try taking pepcid and maalox as need for symptom relief.  From today's labs, your potassium level is slightly low (3.4) - eat plenty of fruits and vegetables, and follow up with your doctor.   For chest pain, follow up with cardiologist in the next couple weeks - call office to arrange appointment.  Return to ER if worse, new symptoms, trouble breathing, recurrent/persistent chest pain, other concern.

## 2016-12-01 MED FILL — LISINOPRIL-HCTZ 10-12.5 MG: 10-12.5 | 90 days supply | Qty: 90 | Fill #1

## 2016-12-01 MED FILL — valACYclovir HCL 1 GM TABS: 1 | 1 days supply | Qty: 4 | Fill #3

## 2017-03-17 DIAGNOSIS — M65341 Trigger finger, right ring finger: Secondary | ICD-10-CM | POA: Diagnosis not present

## 2017-03-23 ENCOUNTER — Ambulatory Visit: Payer: 59 | Admitting: Family Medicine

## 2017-04-10 ENCOUNTER — Ambulatory Visit (INDEPENDENT_AMBULATORY_CARE_PROVIDER_SITE_OTHER): Payer: 59 | Admitting: Family Medicine

## 2017-04-10 ENCOUNTER — Encounter: Payer: Self-pay | Admitting: Family Medicine

## 2017-04-10 VITALS — BP 140/90 | HR 75 | Temp 98.8°F | Ht 61.0 in | Wt 125.0 lb

## 2017-04-10 DIAGNOSIS — R739 Hyperglycemia, unspecified: Secondary | ICD-10-CM

## 2017-04-10 DIAGNOSIS — I10 Essential (primary) hypertension: Secondary | ICD-10-CM | POA: Diagnosis not present

## 2017-04-10 DIAGNOSIS — E785 Hyperlipidemia, unspecified: Secondary | ICD-10-CM

## 2017-04-10 DIAGNOSIS — F39 Unspecified mood [affective] disorder: Secondary | ICD-10-CM

## 2017-04-10 LAB — BASIC METABOLIC PANEL
BUN: 13 mg/dL (ref 6–23)
CO2: 27 mEq/L (ref 19–32)
CREATININE: 0.81 mg/dL (ref 0.40–1.20)
Calcium: 9.8 mg/dL (ref 8.4–10.5)
Chloride: 106 mEq/L (ref 96–112)
GFR: 94.49 mL/min (ref >60.00)
Glucose, Bld: 92 mg/dL (ref 70–99)
Potassium: 3.9 mEq/L (ref 3.5–5.1)
Sodium: 142 mEq/L (ref 135–145)

## 2017-04-10 LAB — CBC WITH DIFFERENTIAL/PLATELET
BASOS ABS: 0 10*3/uL (ref 0.0–0.1)
Basophils Relative: 0.6 % (ref 0.0–3.0)
EOS ABS: 0 10*3/uL (ref 0.0–0.7)
Eosinophils Relative: 0.5 % (ref 0.0–5.0)
HCT: 38 % (ref 36.0–46.0)
HEMOGLOBIN: 12.4 g/dL (ref 12.0–15.0)
Lymphocytes Relative: 20.1 % (ref 12.0–46.0)
Lymphs Abs: 1.1 10*3/uL (ref 0.7–4.0)
MCHC: 32.6 g/dL (ref 30.0–36.0)
MCV: 88.2 fl (ref 78.0–100.0)
MONO ABS: 0.5 10*3/uL (ref 0.1–1.0)
Monocytes Relative: 8.7 % (ref 3.0–12.0)
Neutro Abs: 4 10*3/uL (ref 1.4–7.7)
Neutrophils Relative %: 70.1 % (ref 43.0–77.0)
Platelets: 355 10*3/uL (ref 150.0–400.0)
RBC: 4.3 Mil/uL (ref 3.87–5.11)
RDW: 14.1 % (ref 11.5–15.5)
WBC: 5.6 10*3/uL (ref 4.0–10.5)

## 2017-04-10 LAB — CHOLESTEROL, TOTAL: CHOLESTEROL: 277 mg/dL — AB (ref 0–200)

## 2017-04-10 LAB — HEMOGLOBIN A1C: Hgb A1c MFr Bld: 6 % (ref 4.6–6.5)

## 2017-04-10 LAB — HDL CHOLESTEROL: HDL: 91.4 mg/dL (ref >39.00)

## 2017-04-10 MED ORDER — LISINOPRIL-HYDROCHLOROTHIAZIDE 20-12.5 MG PO TABS: 1.0000 | ORAL_TABLET | Freq: Every day | ORAL | 3 refills | Status: DC

## 2017-04-10 MED ORDER — ESCITALOPRAM OXALATE 10 MG PO TABS: 10.0000 mg | ORAL_TABLET | Freq: Every day | ORAL | 3 refills | Status: DC

## 2017-04-10 MED FILL — ESCITALOPRAM 10 MG TABLET: 10 | 30 days supply | Qty: 30 | Fill #0

## 2017-04-10 MED FILL — LISINOPRIL-HCTZ 20-12.5 MG: 20-12.5 | 90 days supply | Qty: 90 | Fill #0

## 2017-04-10 NOTE — Patient Instructions (Signed)
BEFORE YOU LEAVE: -labs -follow up: 1 month; CPE in 3 months  Start the new dose of the blood pressure medication.  I sent the new dose to the pharmacy.  Start the Lexapro and take once daily for the stress.  Please call the number provided in the brochure to set up therapy for the stress as well.  Follow-up sooner if any worsening of symptoms or new symptoms arise.  We have ordered labs or studies at this visit. It can take up to 1-2 weeks for results and processing. IF results require follow up or explanation, we will call you with instructions. Clinically stable results will be released to [facility]. If you have not heard from us or cannot find your results in [facility] in 2 weeks please contact our office at [phone].  If you are not yet signed up for [facility], please consider signing up.   We recommend the following healthy lifestyle for LIFE: 1) Small portions. But, make sure to get regular (at least 3 per day), healthy meals and small healthy snacks if needed.  2) Eat a healthy clean diet.   TRY TO EAT: -at least 5-7 servings of low sugar, colorful, and nutrient rich vegetables per day (not corn, potatoes or bananas.) -berries are the best choice if you wish to eat fruit (only eat small amounts if trying to reduce weight)  -lean meets (fish, white meat of chicken or [place]urkey) -vegan proteins for some meals - beans or tofu, whole grains, nuts and seeds -Replace bad fats with good fats - good fats include: fish, nuts and seeds, canola oil, olive oil -small amounts of low fat or non fat dairy -small amounts of100 % whole grains - check the lables -drink plenty of water  AVOID: -SUGAR, sweets, anything with added sugar, corn syrup or sweeteners - must read labels as even foods advertised as "healthy" often are loaded with sugar -if you must have a sweetener, small amounts of stevia may be best -sweetened beverages and artificially sweetened beverages -simple starches (rice, bread,  potatoes, pasta, chips, etc - small amounts of 100% whole grains are ok) -red meat, pork, butter -fried foods, fast food, processed food, excessive dairy, eggs and coconut.  3)Get at least 150 minutes of sweaty aerobic exercise per week.  4)Reduce stress - consider counseling, meditation and relaxation to balance other aspects of your life.

## 2017-04-10 NOTE — Progress Notes (Signed)
HPI:  Past medical history hypertension, hyperlipidemia here for blood pressure follow-up.  She did not follow-up after her last visit when we increased her medications.  Reports he is here for follow-up, but also wants treatment for her anxiety and stress.  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  She has had a lot of family stress caring for her father who was in a car accident and had a lot of damage to his house from the tornado.  Feels overwhelmed at times.  He has generalized anxiety, anhedonia, mood on a regular basis.  Sometimes when she gets very stressed she will get a headache.  She did take a medicine for similar symptoms remotely after her mother passed away.  But she is interested in taking a medication again.  She also therapy.   She thinks her blood pressure is up from the stress.  She has been taking the blood pressure medication daily, is eating healthy and reports she is getting regular exercise.  Denies chest pain, shortness of breath, significant depression, thoughts of self-harm, panic attacks or manic symptoms.  ROS: See pertinent positives and negatives per HPI.  Past Medical History:  Diagnosis Date  . Acid reflux   . Blood in stool   . GERD (gastroesophageal reflux disease)   . Hypercholesteremia   . Hyperlipidemia   . Hypertension   . Hypertension   . Kidney stones     Past Surgical History:  Procedure Laterality Date  . CYST EXCISION PERINEAL N/A 06/13/2016   Procedure: CYST EXCISION PERIANAL, CHRONIC INFECTED;  Surgeon: Jimmye Norman, MD;  Location: [facility];  Service: General;  Laterality: N/A;  . EVALUATION UNDER ANESTHESIA WITH HEMORRHOIDECTOMY N/A 06/13/2016   Procedure: EXAM UNDER ANESTHESIA,  HEMORRHOIDECTOMY;  Surgeon: Jimmye Norman, MD;  Location: [facility];  Service: General;  Laterality: N/A;  . lumpectomy right breast  2011   benign    Family History  Problem Relation Age of Onset  . Cancer Mother         breast cancer at age 59  . Stroke Mother   . Hypertension Mother   . Hypertension Father     Social History   Socioeconomic History  . Marital status: Married    Spouse name: None  . Number of children: None  . Years of education: None  . Highest education level: None  Social Needs  . Financial resource strain: None  . Food insecurity - worry: None  . Food insecurity - inability: None  . Transportation needs - medical: None  . Transportation needs - non-medical: None  Occupational History  . None  Tobacco Use  . Smoking status: Never Smoker  . Smokeless tobacco: Never Used  Substance and Sexual Activity  . Alcohol use: Yes    Comment: social  . Drug use: No  . Sexual activity: None  Other Topics Concern  . None  Social History Narrative  . None     Current Outpatient Medications:  .  atorvastatin (LIPITOR) 20 MG tablet, TAKE 1 TABLET (20 MG TOTAL) BY MOUTH DAILY., Disp: 90 tablet, Rfl: 0 .  mometasone (NASONEX) 50 MCG/ACT nasal spray, Place 2 sprays into the nose daily., Disp: 17 g, Rfl: 5 .  Multiple Vitamin (MULTIVITAMIN WITH MINERALS) TABS tablet, Take 1 tablet by mouth daily., Disp: , Rfl:  .  valACYclovir (VALTREX) 1000 MG tablet, 2000mg  (2 tabs) twice daily for 1 day. Take at fist signs of outbreak., Disp: 4  tablet, Rfl: 3 .  escitalopram (LEXAPRO) 10 MG tablet, Take 1 tablet (10 mg total) by mouth daily., Disp: 30 tablet, Rfl: 3 .  lisinopril-hydrochlorothiazide (ZESTORETIC) 20-12.5 MG tablet, Take 1 tablet by mouth daily., Disp: 30 tablet, Rfl: 3  EXAM:  Vitals:   04/10/17 1152  BP: 140/90  Pulse: 75  Temp: 98.8 F (37.1 C)    Body mass index is 23.62 kg/m.  GENERAL: vitals reviewed and listed above, alert, oriented, appears well hydrated and in no acute distress  HEENT: atraumatic, conjunttiva clear, no obvious abnormalities on inspection of external nose and ears  NECK: no obvious masses on inspection  LUNGS: clear to auscultation bilaterally,  no wheezes, rales or rhonchi, good air movement  CV: HRRR, no peripheral edema  MS: moves all extremities without noticeable abnormality  PSYCH/NERO: pleasant and cooperative, she does become tearful when talking about stress anxiety, no obvious depression or anxiety, speech and thought processing grossly intact, cranial nerves II through XII grossly intact, finger to nose normal  ASSESSMENT AND PLAN:  Discussed the following assessment and plan:  Essential hypertension - Plan: Basic metabolic panel, CBC with Differential/Platelet  Hyperglycemia - Plan: Hemoglobin A1c  Hyperlipidemia, unspecified hyperlipidemia type - Plan: HDL cholesterol, Cholesterol, total  Mood disorder (HCC)  -Information provided for cognitive behavioral therapy for the treatment of the anxiety and depression, she agrees to schedule, no severe symptoms -She also wants to start Lexapro after discussion of various options, risks and benefits -Labs today for chronic disease -We also increased the blood pressure medication today -Advise close follow-up in 1 month to recheck the blood pressure and to follow-up on the anxiety depression -She is also due for preventive care visit and advised that she schedule that in 3 months -he said she has had her flu shot and also had her colon cancer screening done in the past with a colonoscopy -Patient advised to return or notify a doctor immediately if symptoms worsen or persist or new concerns arise.  Patient Instructions  BEFORE YOU LEAVE: -labs -follow up: 1 month; CPE in 3 months  Start the new dose of the blood pressure medication.  I sent the new dose to the pharmacy.  Start the Lexapro and take once daily for the stress.  Please call the number provided in the brochure to set up therapy for the stress as well.  Follow-up sooner if any worsening of symptoms or new symptoms arise.  We have ordered labs or studies at this visit. It can take up to 1-2 weeks for results  and processing. IF results require follow up or explanation, we will call you with instructions. Clinically stable results will be released to your [facility]. If you have not heard from [place] or cannot find your results in [facility] in 2 weeks please contact our office at [phone].  If you are not yet signed up for [facility], please consider signing up.   We recommend the following healthy lifestyle for LIFE: 1) Small portions. But, make sure to get regular (at least 3 per day), healthy meals and small healthy snacks if needed.  2) Eat a healthy clean diet.   TRY TO EAT: -at least 5-7 servings of low sugar, colorful, and nutrient rich vegetables per day (not corn, potatoes or bananas.) -berries are the best choice if you wish to eat fruit (only eat small amounts if trying to reduce weight)  -lean meets (fish, white meat of chicken or [place]) -vegan proteins for some meals - beans or  tofu, whole grains, nuts and seeds -Replace bad fats with good fats - good fats include: fish, nuts and seeds, canola oil, olive oil -small amounts of low fat or non fat dairy -small amounts of100 % whole grains - check the lables -drink plenty of water  AVOID: -SUGAR, sweets, anything with added sugar, corn syrup or sweeteners - must read labels as even foods advertised as "healthy" often are loaded with sugar -if you must have a sweetener, small amounts of stevia may be best -sweetened beverages and artificially sweetened beverages -simple starches (rice, bread, potatoes, pasta, chips, etc - small amounts of 100% whole grains are ok) -red meat, pork, butter -fried foods, fast food, processed food, excessive dairy, eggs and coconut.  3)Get at least 150 minutes of sweaty aerobic exercise per week.  4)Reduce stress - consider counseling, meditation and relaxation to balance other aspects of your life.           Kriste BasqueKIM, HANNAH R., DO

## 2017-04-17 ENCOUNTER — Other Ambulatory Visit: Payer: Self-pay | Admitting: *Deleted

## 2017-04-17 MED ORDER — ATORVASTATIN CALCIUM 20 MG PO TABS: 20.0000 mg | ORAL_TABLET | Freq: Every day | ORAL | 1 refills | Status: DC

## 2017-04-17 MED FILL — ATORVASTATIN 20 MG TABLET: 20 | 90 days supply | Qty: 90 | Fill #0

## 2017-04-17 NOTE — Telephone Encounter (Signed)
Rx done. 

## 2017-04-17 NOTE — Addendum Note (Signed)
Addended by: Johnella MoloneyFUNDERBURK, Sailor Hevia A on: 04/17/2017 03:14 PM   Modules accepted: Orders

## 2017-05-01 ENCOUNTER — Ambulatory Visit (INDEPENDENT_AMBULATORY_CARE_PROVIDER_SITE_OTHER): Payer: 59 | Admitting: Psychology

## 2017-05-01 DIAGNOSIS — F320 Major depressive disorder, single episode, mild: Secondary | ICD-10-CM

## 2017-05-01 DIAGNOSIS — F411 Generalized anxiety disorder: Secondary | ICD-10-CM

## 2017-05-09 NOTE — Progress Notes (Signed)
HPI:  Follow-up: Due for physical When asked about colon cancer screening, she reports she got the Cologuard kit, but never completed it and she thinks it is expired. She agrees to schedule her mammogram and schedule her physical.  Anxiety and depression: -Advised CBT and started Lexapro at her last visit 04/2017 -Reports: She is doing so much better, mood is great -Denies: Depression, anxiety or thoughts of harm  Hypertension: -Increased blood pressure medication last visit  -Reports she is feeling much better, has had resolution of the mild headache she used to have -No chest pain, shortness of breath palpitations  Prediabetes and elevated cholesterol and recent labs: -Recommended lifestyle changes and recheck at physical in April -Reports she is working on a healthier diet and getting some exercise  Due for a number of preventive measures, recommended she schedule her physical/preventive visit.  She is supposed to do her physical in April.  ROS: See pertinent positives and negatives per HPI.  Past Medical History:  Diagnosis Date  . Acid reflux   . Blood in stool   . GERD (gastroesophageal reflux disease)   . Hypercholesteremia   . Hyperlipidemia   . Hypertension   . Hypertension   . Kidney stones     Past Surgical History:  Procedure Laterality Date  . CYST EXCISION PERINEAL N/A 06/13/2016   Procedure: CYST EXCISION PERIANAL, CHRONIC INFECTED;  Surgeon: Judeth Horn, MD;  Location: [place];  Service: General;  Laterality: N/A;  . EVALUATION UNDER ANESTHESIA WITH HEMORRHOIDECTOMY N/A 06/13/2016   Procedure: EXAM UNDER ANESTHESIA,  HEMORRHOIDECTOMY;  Surgeon: Judeth Horn, MD;  Location: [place];  Service: General;  Laterality: N/A;  . lumpectomy right breast  2011   benign    Family History  Problem Relation Age of Onset  . Cancer Mother        breast cancer at age 74  . Stroke Mother   . Hypertension Mother   . Hypertension  Father     Social History   Socioeconomic History  . Marital status: Married    Spouse name: None  . Number of children: None  . Years of education: None  . Highest education level: None  Social Needs  . Financial resource strain: None  . Food insecurity - worry: None  . Food insecurity - inability: None  . Transportation needs - medical: None  . Transportation needs - non-medical: None  Occupational History  . None  Tobacco Use  . Smoking status: Never Smoker  . Smokeless tobacco: Never Used  Substance and Sexual Activity  . Alcohol use: Yes    Comment: social  . Drug use: No  . Sexual activity: None  Other Topics Concern  . None  Social History Narrative  . None     Current Outpatient Medications:  .  atorvastatin (LIPITOR) 20 MG tablet, Take 1 tablet (20 mg total) by mouth daily., Disp: 90 tablet, Rfl: 1 .  escitalopram (LEXAPRO) 10 MG tablet, Take 1 tablet (10 mg total) by mouth daily., Disp: 30 tablet, Rfl: 3 .  lisinopril-hydrochlorothiazide (ZESTORETIC) 20-12.5 MG tablet, Take 1 tablet by mouth daily., Disp: 30 tablet, Rfl: 3 .  mometasone (NASONEX) 50 MCG/ACT nasal spray, Place 2 sprays into the nose daily., Disp: 17 g, Rfl: 5 .  Multiple Vitamin (MULTIVITAMIN WITH MINERALS) TABS tablet, Take 1 tablet by mouth daily., Disp: , Rfl:  .  valACYclovir (VALTREX) 1000 MG tablet, 2072m (2 tabs) twice daily for 1 day. Take at fist  signs of outbreak., Disp: 4 tablet, Rfl: 3  EXAM:  Vitals:   05/11/17 1059  BP: 122/66  Pulse: 68  Temp: 98.1 F (36.7 C)    Body mass index is 23.85 kg/m.  GENERAL: vitals reviewed and listed above, alert, oriented, appears well hydrated and in no acute distress  HEENT: atraumatic, conjunttiva clear, no obvious abnormalities on inspection of external nose and ears  NECK: no obvious masses on inspection  LUNGS: clear to auscultation bilaterally, no wheezes, rales or rhonchi, good air movement  CV: HRRR, no peripheral  edema  MS: moves all extremities without noticeable abnormality  PSYCH: pleasant and cooperative, no obvious depression or anxiety  ASSESSMENT AND PLAN:  Discussed the following assessment and plan:  Anxiety and depression  Essential hypertension  Hyperglycemia  Hyperlipidemia, unspecified hyperlipidemia type  -I am glad her mood is doing so much better and that she is feeling better with better control of her blood pressure -Advised a healthy low sugar diet and regular exercise, advised if she wants a diet plan to follow that the Mediterranean diet is a good choice -Discussed her health maintenance measures that are due and strongly advised that she complete her colon cancer screening as soon as possible.  She has opted to do the Cologuard test.  We actually ordered this for her in the past, but she did not complete it.  She thinks it is expired.  We will reorder the test and I recommended that she call the [facility].  Recommended that she complete this promptly and discussed the risk of colon cancer and rationale for screening. -She agrees to schedule her mammogram -Physical in 3 months with labs -Patient advised to return or notify a doctor immediately if symptoms worsen or persist or new concerns arise.  Patient Instructions  BEFORE YOU LEAVE: -PHQ 9 -order cologuard -follow up: CPE in 3 months  We ordered the Cologuard test for colon cancer screening. Please complete this test promptly once the kit arrives. Please contact us if you have not received your kit in the next few weeks.  Continue current medications and counseling.  I am glad you are feeling so much better.  Please call to schedule your mammogram.   Lucretia Kern, DO

## 2017-05-11 ENCOUNTER — Encounter: Payer: Self-pay | Admitting: Family Medicine

## 2017-05-11 ENCOUNTER — Ambulatory Visit: Payer: 59 | Admitting: Family Medicine

## 2017-05-11 VITALS — BP 122/66 | HR 68 | Temp 98.1°F | Ht 61.0 in | Wt 126.2 lb

## 2017-05-11 DIAGNOSIS — E785 Hyperlipidemia, unspecified: Secondary | ICD-10-CM

## 2017-05-11 DIAGNOSIS — F419 Anxiety disorder, unspecified: Secondary | ICD-10-CM

## 2017-05-11 DIAGNOSIS — R739 Hyperglycemia, unspecified: Secondary | ICD-10-CM | POA: Diagnosis not present

## 2017-05-11 DIAGNOSIS — F329 Major depressive disorder, single episode, unspecified: Secondary | ICD-10-CM

## 2017-05-11 DIAGNOSIS — I10 Essential (primary) hypertension: Secondary | ICD-10-CM

## 2017-05-11 NOTE — Patient Instructions (Addendum)
BEFORE YOU LEAVE: -PHQ 9 -order cologuard -follow up: CPE in 3 months  We ordered the Cologuard test for colon cancer screening. Please complete this test promptly once the kit arrives. Please contact us if you have not received your kit in the next few weeks.  Continue current medications and counseling.  I am glad you are feeling so much better.  Please call to schedule your mammogram.

## 2017-05-12 MED FILL — ESCITALOPRAM 10 MG TABLET: 10 | 30 days supply | Qty: 30 | Fill #1

## 2017-05-26 ENCOUNTER — Other Ambulatory Visit: Payer: Self-pay | Admitting: Family Medicine

## 2017-06-01 ENCOUNTER — Ambulatory Visit (INDEPENDENT_AMBULATORY_CARE_PROVIDER_SITE_OTHER): Payer: 59 | Admitting: Psychology

## 2017-06-01 DIAGNOSIS — F320 Major depressive disorder, single episode, mild: Secondary | ICD-10-CM | POA: Diagnosis not present

## 2017-06-01 DIAGNOSIS — F411 Generalized anxiety disorder: Secondary | ICD-10-CM

## 2017-06-21 MED FILL — MOMETASONE FUROATE 50 MCG S: 50 | 30 days supply | Qty: 17 | Fill #1

## 2017-06-21 MED FILL — ESCITALOPRAM 10 MG TABLET: 10 | 30 days supply | Qty: 30 | Fill #2

## 2017-06-23 ENCOUNTER — Other Ambulatory Visit: Payer: Self-pay | Admitting: Orthopedic Surgery

## 2017-06-29 DIAGNOSIS — H52223 Regular astigmatism, bilateral: Secondary | ICD-10-CM | POA: Diagnosis not present

## 2017-06-29 DIAGNOSIS — H5203 Hypermetropia, bilateral: Secondary | ICD-10-CM | POA: Diagnosis not present

## 2017-06-29 DIAGNOSIS — H524 Presbyopia: Secondary | ICD-10-CM | POA: Diagnosis not present

## 2017-07-04 ENCOUNTER — Ambulatory Visit (INDEPENDENT_AMBULATORY_CARE_PROVIDER_SITE_OTHER): Payer: 59 | Admitting: Psychology

## 2017-07-04 DIAGNOSIS — F320 Major depressive disorder, single episode, mild: Secondary | ICD-10-CM

## 2017-07-14 ENCOUNTER — Encounter (HOSPITAL_BASED_OUTPATIENT_CLINIC_OR_DEPARTMENT_OTHER): Payer: Self-pay | Admitting: *Deleted

## 2017-07-14 ENCOUNTER — Other Ambulatory Visit: Payer: Self-pay

## 2017-07-27 ENCOUNTER — Ambulatory Visit (INDEPENDENT_AMBULATORY_CARE_PROVIDER_SITE_OTHER): Payer: 59 | Admitting: Psychology

## 2017-07-27 ENCOUNTER — Encounter (HOSPITAL_BASED_OUTPATIENT_CLINIC_OR_DEPARTMENT_OTHER)
Admission: RE | Admit: 2017-07-27 | Discharge: 2017-07-27 | Disposition: A | Discharge status: Home / Self Care | Payer: 59 | Source: Ambulatory Visit | Attending: Orthopedic Surgery | Admitting: Orthopedic Surgery

## 2017-07-27 DIAGNOSIS — K219 Gastro-esophageal reflux disease without esophagitis: Secondary | ICD-10-CM | POA: Diagnosis not present

## 2017-07-27 DIAGNOSIS — E7800 Pure hypercholesterolemia, unspecified: Secondary | ICD-10-CM | POA: Diagnosis not present

## 2017-07-27 DIAGNOSIS — Z01812 Encounter for preprocedural laboratory examination: Secondary | ICD-10-CM | POA: Diagnosis not present

## 2017-07-27 DIAGNOSIS — F320 Major depressive disorder, single episode, mild: Secondary | ICD-10-CM | POA: Diagnosis not present

## 2017-07-27 DIAGNOSIS — M65341 Trigger finger, right ring finger: Secondary | ICD-10-CM | POA: Diagnosis not present

## 2017-07-27 DIAGNOSIS — Z79899 Other long term (current) drug therapy: Secondary | ICD-10-CM | POA: Diagnosis not present

## 2017-07-27 DIAGNOSIS — I10 Essential (primary) hypertension: Secondary | ICD-10-CM | POA: Diagnosis not present

## 2017-07-27 LAB — BASIC METABOLIC PANEL
Anion gap: 8 (ref 5–15)
BUN: 12 mg/dL (ref 6–20)
CALCIUM: 9.3 mg/dL (ref 8.9–10.3)
CO2: 24 mmol/L (ref 22–32)
CREATININE: 0.8 mg/dL (ref 0.44–1.00)
Chloride: 108 mmol/L (ref 101–111)
GFR calc Af Amer: >60 mL/min (ref >60)
GLUCOSE: 95 mg/dL (ref 65–99)
Potassium: 4.4 mmol/L (ref 3.5–5.1)
Sodium: 140 mmol/L (ref 135–145)

## 2017-07-28 MED FILL — LISINOPRIL-HCTZ 20-12.5 MG: 20-12.5 | 30 days supply | Qty: 30 | Fill #1

## 2017-07-28 MED FILL — ATORVASTATIN 20 MG TABLET: 20 | 90 days supply | Qty: 90 | Fill #1

## 2017-07-28 MED FILL — ESCITALOPRAM 10 MG TABLET: 10 | 30 days supply | Qty: 30 | Fill #3

## 2017-07-31 ENCOUNTER — Ambulatory Visit (HOSPITAL_BASED_OUTPATIENT_CLINIC_OR_DEPARTMENT_OTHER): Payer: 59 | Admitting: Anesthesiology

## 2017-07-31 ENCOUNTER — Ambulatory Visit (HOSPITAL_BASED_OUTPATIENT_CLINIC_OR_DEPARTMENT_OTHER)
Admission: RE | Admit: 2017-07-31 | Discharge: 2017-07-31 | Disposition: A | Discharge status: Home / Self Care | Payer: 59 | Source: Ambulatory Visit | Attending: Orthopedic Surgery | Admitting: Orthopedic Surgery

## 2017-07-31 ENCOUNTER — Other Ambulatory Visit: Payer: Self-pay

## 2017-07-31 ENCOUNTER — Encounter (HOSPITAL_BASED_OUTPATIENT_CLINIC_OR_DEPARTMENT_OTHER): Payer: Self-pay | Admitting: *Deleted

## 2017-07-31 ENCOUNTER — Encounter (HOSPITAL_BASED_OUTPATIENT_CLINIC_OR_DEPARTMENT_OTHER)
Admission: RE | Disposition: A | Discharge status: Home / Self Care | Payer: Self-pay | Source: Ambulatory Visit | Attending: Orthopedic Surgery

## 2017-07-31 DIAGNOSIS — I10 Essential (primary) hypertension: Secondary | ICD-10-CM | POA: Diagnosis not present

## 2017-07-31 DIAGNOSIS — Z79899 Other long term (current) drug therapy: Secondary | ICD-10-CM | POA: Diagnosis not present

## 2017-07-31 DIAGNOSIS — J309 Allergic rhinitis, unspecified: Secondary | ICD-10-CM | POA: Diagnosis not present

## 2017-07-31 DIAGNOSIS — K219 Gastro-esophageal reflux disease without esophagitis: Secondary | ICD-10-CM | POA: Diagnosis not present

## 2017-07-31 DIAGNOSIS — E785 Hyperlipidemia, unspecified: Secondary | ICD-10-CM | POA: Diagnosis not present

## 2017-07-31 DIAGNOSIS — M65341 Trigger finger, right ring finger: Secondary | ICD-10-CM | POA: Diagnosis not present

## 2017-07-31 DIAGNOSIS — E7800 Pure hypercholesterolemia, unspecified: Secondary | ICD-10-CM | POA: Diagnosis not present

## 2017-07-31 HISTORY — PX: TRIGGER FINGER RELEASE: SHX641

## 2017-07-31 SURGERY — RELEASE, A1 PULLEY, FOR TRIGGER FINGER
Anesthesia: Monitor Anesthesia Care | Site: Hand | Laterality: Right

## 2017-07-31 MED ORDER — PROPOFOL 500 MG/50ML IV EMUL
INTRAVENOUS | Status: AC
  Filled 2017-07-31: qty 100

## 2017-07-31 MED ORDER — PROPOFOL 500 MG/50ML IV EMUL
INTRAVENOUS | Status: DC | PRN
  Administered 2017-07-31: 75 ug/kg/min via INTRAVENOUS

## 2017-07-31 MED ORDER — MIDAZOLAM HCL 2 MG/2ML IJ SOLN
1.0000 mg | INTRAMUSCULAR | Status: DC | PRN
  Administered 2017-07-31: 1 mg via INTRAVENOUS

## 2017-07-31 MED ORDER — HYDROCODONE-ACETAMINOPHEN 5-325 MG PO TABS: ORAL_TABLET | ORAL | 0 refills | Status: DC

## 2017-07-31 MED ORDER — LACTATED RINGERS IV SOLN
INTRAVENOUS | Status: DC
  Administered 2017-07-31 (×2): via INTRAVENOUS

## 2017-07-31 MED ORDER — FENTANYL CITRATE (PF) 100 MCG/2ML IJ SOLN
INTRAMUSCULAR | Status: AC
  Filled 2017-07-31: qty 2

## 2017-07-31 MED ORDER — ONDANSETRON HCL 4 MG/2ML IJ SOLN
INTRAMUSCULAR | Status: AC
  Filled 2017-07-31: qty 2

## 2017-07-31 MED ORDER — LIDOCAINE HCL (PF) 0.5 % IJ SOLN
INTRAMUSCULAR | Status: AC
  Filled 2017-07-31: qty 50

## 2017-07-31 MED ORDER — LIDOCAINE HCL (PF) 1 % IJ SOLN
INTRAMUSCULAR | Status: AC
  Filled 2017-07-31: qty 30

## 2017-07-31 MED ORDER — MIDAZOLAM HCL 2 MG/2ML IJ SOLN
INTRAMUSCULAR | Status: AC
  Filled 2017-07-31: qty 2

## 2017-07-31 MED ORDER — PROMETHAZINE HCL 25 MG/ML IJ SOLN: 6.2500 mg | INTRAMUSCULAR | Status: DC | PRN

## 2017-07-31 MED ORDER — FENTANYL CITRATE (PF) 100 MCG/2ML IJ SOLN
50.0000 ug | INTRAMUSCULAR | Status: DC | PRN
  Administered 2017-07-31: 50 ug via INTRAVENOUS

## 2017-07-31 MED ORDER — CEFAZOLIN SODIUM-DEXTROSE 2-4 GM/100ML-% IV SOLN
2.0000 g | INTRAVENOUS | Status: AC
  Administered 2017-07-31: 2 g via INTRAVENOUS

## 2017-07-31 MED ORDER — LIDOCAINE HCL (PF) 0.5 % IJ SOLN
INTRAMUSCULAR | Status: DC | PRN
  Administered 2017-07-31: 30 mL via INTRAVENOUS

## 2017-07-31 MED ORDER — SCOPOLAMINE 1 MG/3DAYS TD PT72
1.0000 | MEDICATED_PATCH | Freq: Once | TRANSDERMAL | Status: DC | PRN
  Administered 2017-07-31: 1.5 mg via TRANSDERMAL

## 2017-07-31 MED ORDER — SCOPOLAMINE 1 MG/3DAYS TD PT72
MEDICATED_PATCH | TRANSDERMAL | Status: AC
  Filled 2017-07-31: qty 1

## 2017-07-31 MED ORDER — BUPIVACAINE HCL (PF) 0.25 % IJ SOLN
INTRAMUSCULAR | Status: AC
Start: 2017-07-31 — End: 2017-07-31
  Filled 2017-07-31: qty 60

## 2017-07-31 MED ORDER — ONDANSETRON HCL 4 MG/2ML IJ SOLN
INTRAMUSCULAR | Status: DC | PRN
  Administered 2017-07-31: 4 mg via INTRAVENOUS

## 2017-07-31 MED ORDER — BUPIVACAINE HCL (PF) 0.25 % IJ SOLN
INTRAMUSCULAR | Status: DC | PRN
  Administered 2017-07-31: 5 mL

## 2017-07-31 MED ORDER — CEFAZOLIN SODIUM-DEXTROSE 2-4 GM/100ML-% IV SOLN
INTRAVENOUS | Status: AC
  Filled 2017-07-31: qty 100

## 2017-07-31 MED ORDER — LIDOCAINE HCL (CARDIAC) PF 100 MG/5ML IV SOSY
PREFILLED_SYRINGE | INTRAVENOUS | Status: DC | PRN
  Administered 2017-07-31: 50 mg via INTRAVENOUS

## 2017-07-31 MED ORDER — HYDROMORPHONE HCL 1 MG/ML IJ SOLN: 0.2500 mg | INTRAMUSCULAR | Status: DC | PRN

## 2017-07-31 MED FILL — HYDROCODON-APAP 5-325: 5-325 | 3 days supply | Qty: 20 | Fill #0

## 2017-07-31 SURGICAL SUPPLY — 32 items
BANDAGE COBAN STERILE 2 (GAUZE/BANDAGES/DRESSINGS) ×3 IMPLANT
BLADE SURG 15 STRL LF DISP TIS (BLADE) ×2 IMPLANT
BLADE SURG 15 STRL SS (BLADE) ×4
BNDG ESMARK 4X9 LF (GAUZE/BANDAGES/DRESSINGS) IMPLANT
CHLORAPREP W/TINT 26ML (MISCELLANEOUS) IMPLANT
CORD BIPOLAR FORCEPS 12FT (ELECTRODE) ×3 IMPLANT
COVER BACK TABLE 60X90IN (DRAPES) ×3 IMPLANT
COVER MAYO STAND STRL (DRAPES) ×3 IMPLANT
CUFF TOURNIQUET SINGLE 18IN (TOURNIQUET CUFF) ×3 IMPLANT
DRAPE EXTREMITY T 121X128X90 (DRAPE) ×3 IMPLANT
DRAPE SURG 17X23 STRL (DRAPES) ×3 IMPLANT
DURAPREP 26ML APPLICATOR (WOUND CARE) ×3 IMPLANT
GAUZE SPONGE 4X4 12PLY STRL (GAUZE/BANDAGES/DRESSINGS) ×3 IMPLANT
GAUZE XEROFORM 1X8 LF (GAUZE/BANDAGES/DRESSINGS) ×3 IMPLANT
GLOVE BIO SURGEON STRL SZ7.5 (GLOVE) IMPLANT
GLOVE BIOGEL PI IND STRL 7.0 (GLOVE) ×2 IMPLANT
GLOVE BIOGEL PI IND STRL 8 (GLOVE) ×1 IMPLANT
GLOVE BIOGEL PI INDICATOR 7.0 (GLOVE) ×4
GLOVE BIOGEL PI INDICATOR 8 (GLOVE) ×2
GLOVE SURG SS PI 7.5 STRL IVOR (GLOVE) ×3 IMPLANT
GOWN STRL REUS W/ TWL LRG LVL3 (GOWN DISPOSABLE) ×1 IMPLANT
GOWN STRL REUS W/TWL LRG LVL3 (GOWN DISPOSABLE) ×2
GOWN STRL REUS W/TWL XL LVL3 (GOWN DISPOSABLE) ×3 IMPLANT
NEEDLE HYPO 25X1 1.5 SAFETY (NEEDLE) ×3 IMPLANT
NS IRRIG 1000ML POUR BTL (IV SOLUTION) ×3 IMPLANT
PACK BASIN DAY SURGERY FS (CUSTOM PROCEDURE TRAY) ×3 IMPLANT
STOCKINETTE 4X48 STRL (DRAPES) ×3 IMPLANT
SUT ETHILON 4 0 PS 2 18 (SUTURE) ×3 IMPLANT
SYR BULB 3OZ (MISCELLANEOUS) ×3 IMPLANT
SYR CONTROL 10ML LL (SYRINGE) ×3 IMPLANT
TOWEL OR 17X24 6PK STRL BLUE (TOWEL DISPOSABLE) ×6 IMPLANT
UNDERPAD 30X30 (UNDERPADS AND DIAPERS) ×3 IMPLANT

## 2017-07-31 NOTE — Discharge Instructions (Addendum)

## 2017-07-31 NOTE — Brief Op Note (Signed)
07/31/2017  2:08 PM  PATIENT:  Arabella Merles  57 y.o. 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.341  PROCEDURE:  Procedure(s): RELEASE TRIGGER FINGER/A-1 PULLEY RIGHT RING FINGER (Right)  SURGEON:  Surgeon(s) and Role:    * Betha Loa, MD - Primary  PHYSICIAN ASSISTANT:   ASSISTANTS: none   ANESTHESIA:   Bier with sedation  EBL: Minimal  BLOOD ADMINISTERED:none  DRAINS: none   LOCAL MEDICATIONS USED:  MARCAINE     SPECIMEN:  No Specimen  DISPOSITION OF SPECIMEN:  N/A  COUNTS:  YES  TOURNIQUET:   Total Tourniquet Time Documented: Upper Arm (Right) - 20 minutes Total: Upper Arm (Right) - 20 minutes   DICTATION: .Note written in EPIC  PLAN OF CARE: Discharge to home after PACU  PATIENT DISPOSITION:  PACU - hemodynamically stable.

## 2017-07-31 NOTE — Transfer of Care (Signed)
Immediate Anesthesia Transfer of Care Note  Patient: Alice Ryan  Procedure(s) Performed: RELEASE TRIGGER FINGER/A-1 PULLEY RIGHT RING FINGER (Right Hand)  Patient Location: PACU  Anesthesia Type:Bier block  Level of Consciousness: awake, drowsy and patient cooperative  Airway & Oxygen Therapy: Patient Spontanous Breathing and Patient connected to face mask oxygen  Post-op Assessment: Report given to RN and Post -op Vital signs reviewed and stable  Post vital signs: Reviewed and stable  Last Vitals:  Vitals Value Taken Time  BP    Temp    Pulse    Resp    SpO2      Last Pain:  Vitals:   07/31/17 1239  TempSrc: Oral  PainSc: 0-No pain      Patients Stated Pain Goal: 3 (07/31/17 1239)  Complications: No apparent anesthesia complications

## 2017-07-31 NOTE — H&P (Signed)
Alice Ryan is an 57 y.o. female.   Chief Complaint: right ring trigger HPI: 57 yo female with triggering right ring finger.  This has been injected without lasting resolution.  She wishes to have a trigger release.  Allergies:  Allergies  Allergen Reactions  . Apple Anaphylaxis  . Latex Anaphylaxis    Latex condom  . Chlorhexidine Gluconate Itching  . Flounder [Fish Allergy] Swelling  . Percocet [Oxycodone-Acetaminophen] Other (See Comments)    "knocked me out"  . Peanut Butter Flavor Itching    Past Medical History:  Diagnosis Date  . Acid reflux   . Blood in stool   . GERD (gastroesophageal reflux disease)   . Hypercholesteremia   . Hyperlipidemia   . Hypertension   . Hypertension   . Kidney stones     Past Surgical History:  Procedure Laterality Date  . CYST EXCISION PERINEAL N/A 06/13/2016   Procedure: CYST EXCISION PERIANAL, CHRONIC INFECTED;  Surgeon: Jimmye Norman, MD;  Location: [facility];  Service: General;  Laterality: N/A;  . EVALUATION UNDER ANESTHESIA WITH HEMORRHOIDECTOMY N/A 06/13/2016   Procedure: EXAM UNDER ANESTHESIA,  HEMORRHOIDECTOMY;  Surgeon: Jimmye Norman, MD;  Location: [facility];  Service: General;  Laterality: N/A;  . lumpectomy right breast  2011   benign    Family History: Family History  Problem Relation Age of Onset  . Cancer Mother        breast cancer at age 49  . Stroke Mother   . Hypertension Mother   . Hypertension Father     Social History:   reports that she has never smoked. She has never used smokeless tobacco. She reports that she drinks alcohol. She reports that she does not use drugs.  Medications: Medications Prior to Admission  Medication Sig Dispense Refill  . atorvastatin (LIPITOR) 20 MG tablet Take 1 tablet (20 mg total) by mouth daily. 90 tablet 1  . escitalopram (LEXAPRO) 10 MG tablet Take 1 tablet (10 mg total) by mouth daily. 30 tablet 3  . lisinopril-hydrochlorothiazide  (ZESTORETIC) 20-12.5 MG tablet Take 1 tablet by mouth daily. 30 tablet 3  . mometasone (NASONEX) 50 MCG/ACT nasal spray Place 2 sprays into the nose daily. 17 g 5  . Multiple Vitamin (MULTIVITAMIN WITH MINERALS) TABS tablet Take 1 tablet by mouth daily.    . valACYclovir (VALTREX) 1000 MG tablet TAKE 2 TABLETS BY MOUTH TWICE DAILY FOR 1 DAY. TAKE AT FIST SIGNS OF OUTBREAK. 4 tablet 3    No results found for this or any previous visit (from the past 48 hour(s)).  No results found.   A comprehensive review of systems was negative.  Blood pressure (!) 127/51, pulse (!) 58, temperature 98.3 F (36.8 C), temperature source Oral, resp. rate 16, height  (1.549 m), weight 57.2 kg (126 lb), last menstrual period 09/02/2013, SpO2 100 %.  General appearance: alert, cooperative and appears stated age Head: Normocephalic, without obvious abnormality, atraumatic Neck: supple, symmetrical, trachea midline Cardio: regular rate and rhythm Resp: clear to auscultation bilaterally Extremities: Intact sensation and capillary refill all digits.  +epl/fpl/io.  No wounds.  Pulses: 2+ and symmetric Skin: Skin color, texture, turgor normal. No rashes or lesions Neurologic: Grossly normal Incision/Wound: none  Assessment/Plan Right ring finger trigger release.  Non operative and operative treatment options were discussed with the patient and patient wishes to proceed with operative treatment. Risks, benefits, and alternatives of surgery were discussed and the patient agrees with the plan of  care.   Betha Loa R 07/31/2017, 1:33 PM

## 2017-07-31 NOTE — Anesthesia Postprocedure Evaluation (Signed)
Anesthesia Post Note  Patient: Alice Ryan  Procedure(s) Performed: RELEASE TRIGGER FINGER/A-1 PULLEY RIGHT RING FINGER (Right Hand)     Patient location during evaluation: PACU Anesthesia Type: Bier Block Level of consciousness: awake and alert Pain management: pain level controlled Vital Signs Assessment: post-procedure vital signs reviewed and stable Respiratory status: spontaneous breathing, nonlabored ventilation, respiratory function stable and patient connected to nasal cannula oxygen Cardiovascular status: stable and blood pressure returned to baseline Postop Assessment: no apparent nausea or vomiting Anesthetic complications: no    Last Vitals:  Vitals:   07/31/17 1445 07/31/17 1509  BP:  (!) 156/81  Pulse: (!) 55 (!) 54  Resp: 15 14  Temp:  36.7 C  SpO2: 100% 100%    Last Pain:  Vitals:   07/31/17 1509  TempSrc:   PainSc: 0-No pain                 [name] P Mikhaila Roh

## 2017-07-31 NOTE — Op Note (Signed)
07/31/2017 [facility]  Operative Note  PREOPERATIVE DIAGNOSIS: RIGHT RING TRIGGER FINGER M65.341  POSTOPERATIVE DIAGNOSIS:  RIGHT RING TRIGGER FINGER M65.341  PROCEDURE: Procedure(s): RELEASE TRIGGER FINGER/A-1 PULLEY RIGHT RING FINGER  SURGEON:  Betha Loa, MD  ASSISTANT:  none.  ANESTHESIA:  Bier block and Sedation.  IV FLUIDS:  Per anesthesia flow sheet.  ESTIMATED BLOOD LOSS:  Minimal.  COMPLICATIONS:  None.  SPECIMENS:  None.  TOURNIQUET TIME:  Total Tourniquet Time Documented: Upper Arm (Right) - 20 minutes Total: Upper Arm (Right) - 20 minutes   DISPOSITION:  Stable to PACU.  LOCATION: [facility]  INDICATIONS: Alice Ryan is a 57 y.o. female with triggering of right ring finger.  This is been injected without lasting relief.  She wishes to have a trigger release.  Risks, benefits and alternatives of surgery were discussed including the risk of blood loss, infection, damage to nerves, vessels, tendons, ligaments, bone, failure of surgery, need for additional surgery, complications with wound healing, continued pain, continued triggering and need for repeat surgery.  She voiced understanding of these risks and elected to proceed.  OPERATIVE COURSE:  After being identified preoperatively by myself, the patient and I agreed upon the procedure and site of procedure.  The surgical site was marked. The risks, benefits, and alternatives of surgery were reviewed and she wished to proceed.  Surgical consent had been signed. She was given IV Ancef as preoperative antibiotic prophylaxis. She was transported to the operating room and placed on the operating room table in supine position with the Right upper extremity on an arm board. Bier block and Sedation was induced by the anesthesiologist.  The Right upper extremity was prepped and draped in normal sterile orthopedic fashion. A surgical pause was performed between surgeons, anesthesia, and  operating room staff, and all were in agreement as to the patient, procedure, and site of procedure.  Tourniquet at the proximal aspect of the forearm had been inflated for the Bier block.  An incision was made at the volar aspect of the MP joint of the ring finger.  This was carried into the subcutaneous tissues by preading technique.  Bipolar electrocautery was used to obtain hemostasis.  The radial and ulnar digital nerves were protected throughout the case. The flexor sheath was identified.  The A1 pulley was identified and sharply incised.  It was released in its entirety.  The proximal 1-2 mm of the A2 pulley was vented to allow better excursion of the tendons.  The finger was placed through a range of motion and there was noted to be no catching.  The tendons were brought through the wound and any adherences released.  The wound was then copiously irrigated with sterile saline. It was closed with 4-0 nylon in a horizontal mattress fashion.  It was injected with 0.25% plain Marcaine to aid in postoperative analgesia.  It was dressed with sterile Xeroform, 4x4s, and wrapped lightly with a Coban dressing.  Tourniquet was deflated at 20 minutes.  The fingertips were pink with brisk capillary refill after deflation of the tourniquet.  The operative drapes were broken down and the patient was awoken from anesthesia safely.  She was transferred back to the stretcher and taken to the PACU in stable condition.   I will see her back in the office in 1 week for postoperative followup.  I will give her a prescription for Norco 5/325 1-2 tabs po q6 hours prn pain, dispense #20.    Betha Loa  R, MD Electronically signed, 07/31/17

## 2017-07-31 NOTE — Anesthesia Preprocedure Evaluation (Addendum)
Anesthesia Evaluation  Patient identified by MRN, date of birth, ID band Patient awake    Reviewed: Allergy & Precautions, NPO status , Patient's Chart, lab work & pertinent test results  Airway Mallampati: II  TM Distance: >3 FB Neck ROM: Full    Dental no notable dental hx.    Pulmonary neg pulmonary ROS,    Pulmonary exam normal breath sounds clear to auscultation       Cardiovascular hypertension, Pt. on medications Normal cardiovascular exam Rhythm:Regular Rate:Normal  ECG: SB, rate 53   Neuro/Psych PSYCHIATRIC DISORDERS negative neurological ROS     GI/Hepatic negative GI ROS, Neg liver ROS,   Endo/Other  negative endocrine ROS  Renal/GU negative Renal ROS     Musculoskeletal negative musculoskeletal ROS (+)   Abdominal   Peds  Hematology HLD   Anesthesia Other Findings RIGHT RING TRIGGER FINGER  Reproductive/Obstetrics                            Anesthesia Physical Anesthesia Plan  ASA: II  Anesthesia Plan: MAC   Post-op Pain Management:    Induction: Intravenous  PONV Risk Score and Plan: 2 and Ondansetron, Dexamethasone and Treatment may vary due to age or medical condition  Airway Management Planned: Natural Airway  Additional Equipment:   Intra-op Plan:   Post-operative Plan:   Informed Consent: I have reviewed the patients History and Physical, chart, labs and discussed the procedure including the risks, benefits and alternatives for the proposed anesthesia with the patient or authorized representative who has indicated his/her understanding and acceptance.   Dental advisory given  Plan Discussed with: CRNA  Anesthesia Plan Comments:        Anesthesia Quick Evaluation

## 2017-08-01 ENCOUNTER — Encounter (HOSPITAL_BASED_OUTPATIENT_CLINIC_OR_DEPARTMENT_OTHER): Payer: Self-pay | Admitting: Orthopedic Surgery

## 2017-08-06 NOTE — Progress Notes (Signed)
HPI:  Using dictation device. Unfortunately this device frequently misinterprets words/phrases.  Here for CPE:  -Concerns and/or follow up today:  Due for mammo, colon ca screening, labs, pap  Chronic medical problems summarized below were reviewed for changes.  She has been taking the blood pressure medication daily.  However, she feels like since starting lisinopril she has had a dry cough.  She heard that this could be a side effect and would like to stop it. Diet and exercise have not been great, but they do plan to do better as her husband recently had a stroke.  She has had more stress recently due to this.  She has been seeing Dr. Marlowe Sax for counseling.  She reports that her counselor felt that she may need a higher dose of Lexapro.  Has not had many depressive symptoms, but does have some anxiety and did have a "anxiety attack" when her husband had a stroke.  Husband is doing better now.  She would like to try increasing the Lexapro. She recently had surgery for trigger finger. Otherwise is doing well.  Anxiety and depression: -Advised CBT and started Lexapro 04/2017  Hypertension: -meds: lisinopril-hctz  Prediabetes and elevated cholesterol and recent labs: -Recommended lifestyle changes last visit with recheck labs today -meds: atorvastatin  Cold sores: -meds: valtrex  -Diet: variety of foods, balance and well rounded, larger portion sizes -Exercise: no regular exercise -Taking folic acid, vitamin D or calcium: no -Diabetes and Dyslipidemia Screening: Fasting for labs -Vaccines: see vaccine section EPIC -pap history: 03/2013 neg -FDLMP: see nursing notes -sexual activity: yes, female partner, no new partners -wants STI testing (Hep C if born [birth year]-65): no -FH breast, colon or ovarian ca: see FH Last mammogram: 2013, advised and ordered since, but she has not done since Last colon cancer screening: we ordered cologuard for here, but she did not complete reports never  received another kit -  Breast Ca Risk Assessment: see family history and pt history DEXA (>/= 65): Not applicable  -Alcohol, Tobacco, drug use: see social history  Review of Systems - no fevers, unintentional weight loss, vision loss, hearing loss, chest pain, sob, hemoptysis, melena, hematochezia, hematuria, genital discharge, changing or concerning skin lesions, bleeding, bruising, loc, thoughts of self harm or SI  Past Medical History:  Diagnosis Date  . Acid reflux   . Blood in stool   . GERD (gastroesophageal reflux disease)   . Hypercholesteremia   . Hyperlipidemia   . Hypertension   . Hypertension   . Kidney stones     Past Surgical History:  Procedure Laterality Date  . CYST EXCISION PERINEAL N/A 06/13/2016   Procedure: CYST EXCISION PERIANAL, CHRONIC INFECTED;  Surgeon: Judeth Horn, MD;  Location: [place];  Service: General;  Laterality: N/A;  . EVALUATION UNDER ANESTHESIA WITH HEMORRHOIDECTOMY N/A 06/13/2016   Procedure: EXAM UNDER ANESTHESIA,  HEMORRHOIDECTOMY;  Surgeon: Judeth Horn, MD;  Location: [place];  Service: General;  Laterality: N/A;  . lumpectomy right breast  2011   benign  . TRIGGER FINGER RELEASE Right 07/31/2017   Procedure: RELEASE TRIGGER FINGER/A-1 PULLEY RIGHT RING FINGER;  Surgeon: Leanora Cover, MD;  Location: [place];  Service: Orthopedics;  Laterality: Right;    Family History  Problem Relation Age of Onset  . Cancer Mother        breast cancer at age 39  . Stroke Mother   . Hypertension Mother   . Hypertension Father     Social  History   Socioeconomic History  . Marital status: Married    Spouse name: Not on file  . Number of children: Not on file  . Years of education: Not on file  . Highest education level: Not on file  Occupational History  . Not on file  Social Needs  . Financial resource strain: Not on file  . Food insecurity:    Worry: Not on file    Inability: Not on  file  . Transportation needs:    Medical: Not on file    Non-medical: Not on file  Tobacco Use  . Smoking status: Never Smoker  . Smokeless tobacco: Never Used  Substance and Sexual Activity  . Alcohol use: Yes    Comment: social  . Drug use: No  . Sexual activity: Not on file  Lifestyle  . Physical activity:    Days per week: Not on file    Minutes per session: Not on file  . Stress: Not on file  Relationships  . Social connections:    Talks on phone: Not on file    Gets together: Not on file    Attends religious service: Not on file    Active member of club or organization: Not on file    Attends meetings of clubs or organizations: Not on file    Relationship status: Not on file  Other Topics Concern  . Not on file  Social History Narrative  . Not on file     Current Outpatient Medications:  .  atorvastatin (LIPITOR) 20 MG tablet, Take 1 tablet (20 mg total) by mouth daily., Disp: 90 tablet, Rfl: 1 .  HYDROcodone-acetaminophen (NORCO) 5-325 MG tablet, 1-2 tabs po q6 hours prn pain, Disp: 20 tablet, Rfl: 0 .  lisinopril-hydrochlorothiazide (ZESTORETIC) 20-12.5 MG tablet, Take 1 tablet by mouth daily., Disp: 30 tablet, Rfl: 3 .  mometasone (NASONEX) 50 MCG/ACT nasal spray, Place 2 sprays into the nose daily., Disp: 17 g, Rfl: 5 .  Multiple Vitamin (MULTIVITAMIN WITH MINERALS) TABS tablet, Take 1 tablet by mouth daily., Disp: , Rfl:  .  valACYclovir (VALTREX) 1000 MG tablet, TAKE 2 TABLETS BY MOUTH TWICE DAILY FOR 1 DAY. TAKE AT FIST SIGNS OF OUTBREAK., Disp: 4 tablet, Rfl: 3  EXAM:  Vitals:   08/07/17 1022  BP: 132/70  Pulse: 68  Temp: 98.3 F (36.8 C)    GENERAL: vitals reviewed and listed below, alert, oriented, appears well hydrated and in no acute distress  HEENT: head atraumatic, PERRLA, normal appearance of eyes, ears, nose and mouth. moist mucus membranes.  NECK: supple, no masses or lymphadenopathy  LUNGS: clear to auscultation bilaterally, no rales,  rhonchi or wheeze  CV: HRRR, no peripheral edema or cyanosis, normal pedal pulses  ABDOMEN: bowel sounds normal, soft, non tender to palpation, no masses, no rebound or guarding  BREAST: normal appearance - no skin lesions or discharge noted on inspection of both breasts, on palpation of both breast and axillary region no suspicious lesions appreciated today  GU: normal appearance of external genitalia - no lesions or masses appreciated, normal appearing vaginal mucosa - no abnormal discharge, normal appearance of cervix - no lesions or abnormal discharge observed.  Pap obtained.  RECTAL: deferred  SKIN: no rash or abnormal lesions  MS: normal gait, moves all extremities normally  NEURO: normal gait, speech and thought processing grossly intact, muscle tone grossly intact throughout  PSYCH: normal affect, pleasant and cooperative  ASSESSMENT AND PLAN:  Discussed the following assessment and  plan:  PREVENTIVE EXAM: -Discussed and advised all [place] preventive services health task force level A and B recommendations for age, sex and risks. -Advised at least 150 minutes of exercise per week and a healthy diet  -Discussed risk of avoiding-preventive measures such as colon cancer and breast cancer screening.  Advised that she do both of these promptly.  I asked my assistant to place orders for both.  She prefers to do Cologuard.  She let the first kit expire.  Ask my assistant to reorder and to order mammogram at the breast center. Pap was obtained today. -labs, studies and vaccines per orders this encounter  2. Screening for depression -See PHQ 9  3. Essential hypertension -Opted to stop the combo and start hydrochlorothiazide at a higher dose -Lifestyle recommendations as well -Follow-up in 1 month to recheck  4. Hyperlipidemia, unspecified hyperlipidemia type -Labs per orders, lifestyle recommendations  5. Hyperglycemia -Labs per orders, lifestyle recommendations  6.  Cough -Stop lisinopril -Has seasonal allergies, [place] follow-up 1 monthS   7. GAD (generalized anxiety disorder) 8. Depression, recurrent ([place]) -Counseling with Dr. Marlowe Sax -Increase Lexapro -Follow-up 1 month   Patient advised to return to clinic immediately if symptoms worsen or persist or new concerns.  Patient Instructions  BEFORE YOU LEAVE: -labs -PHQ9 epic -order mammogram and cologuard -follow up: 1 month - BP, cough and anxiety  We ordered the colon cancer and the breast cancer screening. Please call if you have not receive kit and appointment details in 1-2 weeks. Please complete promptly. Please check with your insurance if any concerns regarding coverage.  STOP the zestoretic (combo blood pressure medication)  START hydrochlorthiazide 25 mg  Increase the lexapro to '15mg'$  daily  We have ordered labs and a pap smear at this visit. It can take up to 1-2 weeks for results and processing. IF results require follow up or explanation, we will call you with instructions. Clinically stable results will be released to your [facility]. If you have not heard from [place] or cannot find your results in [facility] in 2 weeks please contact our office at [phone].  If you are not yet signed up for [facility], please consider signing up.   Preventive Care 40-64 Years, Female Preventive care refers to lifestyle choices and visits with your health care provider that can promote health and wellness. What does preventive care include?  A yearly physical exam. This is also called an annual well check.  Dental exams once or twice a year.  Routine eye exams. Ask your health care provider how often you should have your eyes checked.  Personal lifestyle choices, including: ? Daily care of your teeth and gums. ? Regular physical activity. ? Eating a healthy diet. ? Avoiding tobacco and drug use. ? Limiting alcohol use. ? Practicing safe sex. ? Taking low-dose aspirin daily starting at age  82. ? Taking vitamin and mineral supplements as recommended by your health care provider. What happens during an annual well check? The services and screenings done by your health care provider during your annual well check will depend on your age, overall health, lifestyle risk factors, and family history of disease. Counseling Your health care provider may ask you questions about your:  Alcohol use.  Tobacco use.  Drug use.  Emotional well-being.  Home and relationship well-being.  Sexual activity.  Eating habits.  Work and work Statistician.  Method of birth control.  Menstrual cycle.  Pregnancy history.  Screening You may have the following tests or measurements:  Height, weight, and BMI.  Blood pressure.  Lipid and cholesterol levels. These may be checked every 5 years, or more frequently if you are over 20 years old.  Skin check.  Lung cancer screening. You may have this screening every year starting at age 7 if you have a 30-pack-year history of smoking and currently smoke or have quit within the past 15 years.  Fecal occult blood test (FOBT) of the stool. You may have this test every year starting at age 58.  Flexible sigmoidoscopy or colonoscopy. You may have a sigmoidoscopy every 5 years or a colonoscopy every 10 years starting at age 68.  Hepatitis C blood test.  Hepatitis B blood test.  Sexually transmitted disease (STD) testing.  Diabetes screening. This is done by checking your blood sugar (glucose) after you have not eaten for a while (fasting). You may have this done every 1-3 years.  Mammogram. This may be done every 1-2 years. Talk to your health care provider about when you should start having regular mammograms. This may depend on whether you have a family history of breast cancer.  BRCA-related cancer screening. This may be done if you have a family history of breast, ovarian, tubal, or peritoneal cancers.  Pelvic exam and Pap test. This  may be done every 3 years starting at age 41. Starting at age 45, this may be done every 5 years if you have a Pap test in combination with an HPV test.  Bone density scan. This is done to screen for osteoporosis. You may have this scan if you are at high risk for osteoporosis.  Discuss your test results, treatment options, and if necessary, the need for more tests with your health care provider. Vaccines Your health care provider may recommend certain vaccines, such as:  Influenza vaccine. This is recommended every year.  Tetanus, diphtheria, and acellular pertussis (Tdap, Td) vaccine. You may need a Td booster every 10 years.  Varicella vaccine. You may need this if you have not been vaccinated.  Zoster vaccine. You may need this after age 32.  Measles, mumps, and rubella (MMR) vaccine. You may need at least one dose of MMR if you were born in 1957 or later. You may also need a second dose.  Pneumococcal 13-valent conjugate (PCV13) vaccine. You may need this if you have certain conditions and were not previously vaccinated.  Pneumococcal polysaccharide (PPSV23) vaccine. You may need one or two doses if you smoke cigarettes or if you have certain conditions.  Meningococcal vaccine. You may need this if you have certain conditions.  Hepatitis A vaccine. You may need this if you have certain conditions or if you travel or work in places where you may be exposed to hepatitis A.  Hepatitis B vaccine. You may need this if you have certain conditions or if you travel or work in places where you may be exposed to hepatitis B.  Haemophilus influenzae type b (Hib) vaccine. You may need this if you have certain conditions.  Talk to your health care provider about which screenings and vaccines you need and how often you need them. This information is not intended to replace advice given to you by your health care provider. Make sure you discuss any questions you have with your health care  provider. Document Released: 04/17/2015 Document Revised: 12/09/2015 Document Reviewed: 01/20/2015 Elsevier Interactive Patient Education  2018 [employer].            No follow-ups on file.  Nickola Major  Maudie Mercury, DO

## 2017-08-07 ENCOUNTER — Other Ambulatory Visit (HOSPITAL_COMMUNITY)
Admission: RE | Admit: 2017-08-07 | Discharge: 2017-08-07 | Disposition: A | Discharge status: Home / Self Care | Payer: 59 | Source: Ambulatory Visit | Attending: Family Medicine | Admitting: Family Medicine

## 2017-08-07 ENCOUNTER — Ambulatory Visit (INDEPENDENT_AMBULATORY_CARE_PROVIDER_SITE_OTHER): Payer: 59 | Admitting: Family Medicine

## 2017-08-07 ENCOUNTER — Encounter: Payer: Self-pay | Admitting: Family Medicine

## 2017-08-07 VITALS — BP 132/70 | HR 68 | Temp 98.3°F | Ht 61.75 in | Wt 128.6 lb

## 2017-08-07 DIAGNOSIS — Z124 Encounter for screening for malignant neoplasm of cervix: Secondary | ICD-10-CM | POA: Diagnosis not present

## 2017-08-07 DIAGNOSIS — R05 Cough: Secondary | ICD-10-CM

## 2017-08-07 DIAGNOSIS — F339 Major depressive disorder, recurrent, unspecified: Secondary | ICD-10-CM | POA: Diagnosis not present

## 2017-08-07 DIAGNOSIS — Z0001 Encounter for general adult medical examination with abnormal findings: Secondary | ICD-10-CM

## 2017-08-07 DIAGNOSIS — F411 Generalized anxiety disorder: Secondary | ICD-10-CM

## 2017-08-07 DIAGNOSIS — R739 Hyperglycemia, unspecified: Secondary | ICD-10-CM

## 2017-08-07 DIAGNOSIS — Z1331 Encounter for screening for depression: Secondary | ICD-10-CM

## 2017-08-07 DIAGNOSIS — I10 Essential (primary) hypertension: Secondary | ICD-10-CM | POA: Diagnosis not present

## 2017-08-07 DIAGNOSIS — Z1239 Encounter for other screening for malignant neoplasm of breast: Secondary | ICD-10-CM

## 2017-08-07 DIAGNOSIS — R059 Cough, unspecified: Secondary | ICD-10-CM

## 2017-08-07 DIAGNOSIS — E785 Hyperlipidemia, unspecified: Secondary | ICD-10-CM

## 2017-08-07 DIAGNOSIS — Z0000 Encounter for general adult medical examination without abnormal findings: Secondary | ICD-10-CM

## 2017-08-07 LAB — CBC
HCT: 36.2 % (ref 36.0–46.0)
Hemoglobin: 12.2 g/dL (ref 12.0–15.0)
MCHC: 33.7 g/dL (ref 30.0–36.0)
MCV: 87.8 fl (ref 78.0–100.0)
PLATELETS: 330 10*3/uL (ref 150.0–400.0)
RBC: 4.12 Mil/uL (ref 3.87–5.11)
RDW: 13.9 % (ref 11.5–15.5)
WBC: 5.4 10*3/uL (ref 4.0–10.5)

## 2017-08-07 LAB — BASIC METABOLIC PANEL
BUN: 19 mg/dL (ref 6–23)
CALCIUM: 9.4 mg/dL (ref 8.4–10.5)
CO2: 29 meq/L (ref 19–32)
CREATININE: 0.74 mg/dL (ref 0.40–1.20)
Chloride: 104 mEq/L (ref 96–112)
GFR: 104.75 mL/min (ref >60.00)
Glucose, Bld: 91 mg/dL (ref 70–99)
Potassium: 4.3 mEq/L (ref 3.5–5.1)
Sodium: 141 mEq/L (ref 135–145)

## 2017-08-07 LAB — LIPID PANEL
CHOLESTEROL: 234 mg/dL — AB (ref 0–200)
HDL: 78.7 mg/dL (ref >39.00)
LDL CALC: 133 mg/dL — AB (ref 0–99)
NonHDL: 155.51
TRIGLYCERIDES: 113 mg/dL (ref 0.0–149.0)
Total CHOL/HDL Ratio: 3
VLDL: 22.6 mg/dL (ref 0.0–40.0)

## 2017-08-07 LAB — HEMOGLOBIN A1C: HEMOGLOBIN A1C: 5.9 % (ref 4.6–6.5)

## 2017-08-07 NOTE — Addendum Note (Signed)
Addended by: Johnella Moloney on: 08/07/2017 11:46 AM   Modules accepted: Orders

## 2017-08-07 NOTE — Patient Instructions (Addendum)
BEFORE YOU LEAVE: -labs -PHQ9 epic -order mammogram and cologuard -follow up: 1 month - BP, cough and anxiety  We ordered the colon cancer and the breast cancer screening. Please call if you have not receive kit and appointment details in 1-2 weeks. Please complete promptly. Please check with your insurance if any concerns regarding coverage.  STOP the zestoretic (combo blood pressure medication)  START hydrochlorthiazide 25 mg  Increase the lexapro to 55m daily  We have ordered labs and a pap smear at this visit. It can take up to 1-2 weeks for results and processing. IF results require follow up or explanation, we will call you with instructions. Clinically stable results will be [facility] If you have not heard from [place] cannot find your results [facility] please contact our office at [phone]  If you are not [facility] for [facility] please consider signing up.   Preventive Care 40-64 Years, Female Preventive care refers to lifestyle choices and visits with your health care provider that can promote health and wellness. What does preventive care include?  A yearly physical exam. This is also called an annual well check.  Dental exams once or twice a year.  Routine eye exams. Ask your health care provider how often you should have your eyes checked.  Personal lifestyle choices, including: ? Daily care of your teeth and gums. ? Regular physical activity. ? Eating a healthy diet. ? Avoiding tobacco and drug use. ? Limiting alcohol use. ? Practicing safe sex. ? Taking low-dose aspirin daily starting at age 55 ? Taking vitamin and mineral supplements as recommended by your health care provider. What happens during an annual well check? The services and screenings done by your health care provider during your annual well check will depend on your age, overall health, lifestyle risk factors, and family history of disease. Counseling Your health  care provider may ask you questions about your:  Alcohol use.  Tobacco use.  Drug use.  Emotional well-being.  Home and relationship well-being.  Sexual activity.  Eating habits.  Work and work eStatistician  Method of birth control.  Menstrual cycle.  Pregnancy history.  Screening You may have the following tests or measurements:  Height, weight, and BMI.  Blood pressure.  Lipid and cholesterol levels. These may be checked every 5 years, or more frequently if you are over 530years old.  Skin check.  Lung cancer screening. You may have this screening every year starting at age 3550if you have a 30-pack-year history of smoking and currently smoke or have quit within the past 15 years.  Fecal occult blood test (FOBT) of the stool. You may have this test every year starting at age 55  Flexible sigmoidoscopy or colonoscopy. You may have a sigmoidoscopy every 5 years or a colonoscopy every 10 years starting at age 55  Hepatitis C blood test.  Hepatitis B blood test.  Sexually transmitted disease (STD) testing.  Diabetes screening. This is done by checking your blood sugar (glucose) after you have not eaten for a while (fasting). You may have this done every 1-3 years.  Mammogram. This may be done every 1-2 years. Talk to your health care provider about when you should start having regular mammograms. This may depend on whether you have a family history of breast cancer.  BRCA-related cancer screening. This may be done if you have a family history of breast, ovarian, tubal, or peritoneal cancers.  Pelvic exam and  Pap test. This may be done every 3 years starting at age 68. Starting at age 62, this may be done every 5 years if you have a Pap test in combination with an HPV test.  Bone density scan. This is done to screen for osteoporosis. You may have this scan if you are at high risk for osteoporosis.  Discuss your test results, treatment options, and if necessary,  the need for more tests with your health care provider. Vaccines Your health care provider may recommend certain vaccines, such as:  Influenza vaccine. This is recommended every year.  Tetanus, diphtheria, and acellular pertussis (Tdap, Td) vaccine. You may need a Td booster every 10 years.  Varicella vaccine. You may need this if you have not been vaccinated.  Zoster vaccine. You may need this after age 81.  Measles, mumps, and rubella (MMR) vaccine. You may need at least one dose of MMR if you were born in 1957 or later. You may also need a second dose.  Pneumococcal 13-valent conjugate (PCV13) vaccine. You may need this if you have certain conditions and were not previously vaccinated.  Pneumococcal polysaccharide (PPSV23) vaccine. You may need one or two doses if you smoke cigarettes or if you have certain conditions.  Meningococcal vaccine. You may need this if you have certain conditions.  Hepatitis A vaccine. You may need this if you have certain conditions or if you travel or work in places where you may be exposed to hepatitis A.  Hepatitis B vaccine. You may need this if you have certain conditions or if you travel or work in places where you may be exposed to hepatitis B.  Haemophilus influenzae type b (Hib) vaccine. You may need this if you have certain conditions.  Talk to your health care provider about which screenings and vaccines you need and how often you need them. This information is not intended to replace advice given to you by your health care provider. Make sure you discuss any questions you have with your health care provider. Document Released: 04/17/2015 Document Revised: 12/09/2015 Document Reviewed: 01/20/2015 Elsevier Interactive Patient Education  [employer].

## 2017-08-08 LAB — CYTOLOGY - PAP
DIAGNOSIS: NEGATIVE
HPV (WINDOPATH): NOT DETECTED

## 2017-08-09 ENCOUNTER — Telehealth: Payer: Self-pay | Admitting: Family Medicine

## 2017-08-09 NOTE — Telephone Encounter (Signed)
Copied from CRM [id number]. Topic: Inquiry >> Aug 09, 2017  1:54 PM Yvonna Alanis wrote: Reason for CRM: Patient called stating that she was supposed to receive two medications after her visit with Dr. Selena Batten on 08/07/2017 for anxiety and blood pressure. Patient did not receive the medications, and she does not know the name of them. Please call patient at [phone].         Thank You!!!

## 2017-08-10 ENCOUNTER — Other Ambulatory Visit: Payer: Self-pay | Admitting: Family Medicine

## 2017-08-10 MED ORDER — HYDROCHLOROTHIAZIDE 25 MG PO TABS: 25.0000 mg | ORAL_TABLET | Freq: Every day | ORAL | 3 refills | Status: DC

## 2017-08-10 MED ORDER — ESCITALOPRAM OXALATE 10 MG PO TABS: 15.0000 mg | ORAL_TABLET | Freq: Every day | ORAL | 3 refills | Status: DC

## 2017-08-10 MED FILL — HYDROCHLOROTHIAZIDE 25 MG T: 25 | 90 days supply | Qty: 90 | Fill #0

## 2017-08-10 NOTE — Progress Notes (Signed)
Contacted pt to let her know both were sent to the pharmacy. I am not sure why they were not able to send based on my notes for both of these prior. Apologized for the delay.

## 2017-08-10 NOTE — Telephone Encounter (Signed)
PT called to check on status of BP medication (different medication) and anxiety (uping to ) medication . She states she needs this today.  She is also requesting CMA call her  At [phone]

## 2017-08-10 NOTE — Telephone Encounter (Signed)
Sent both. Called pt.

## 2017-08-21 ENCOUNTER — Ambulatory Visit (INDEPENDENT_AMBULATORY_CARE_PROVIDER_SITE_OTHER): Payer: 59 | Admitting: Psychology

## 2017-08-21 DIAGNOSIS — F320 Major depressive disorder, single episode, mild: Secondary | ICD-10-CM

## 2017-08-22 ENCOUNTER — Other Ambulatory Visit: Payer: Self-pay | Admitting: Family Medicine

## 2017-08-22 ENCOUNTER — Encounter: Payer: Self-pay | Admitting: Family Medicine

## 2017-08-22 ENCOUNTER — Ambulatory Visit: Payer: 59 | Admitting: Family Medicine

## 2017-08-22 VITALS — BP 120/62 | HR 84 | Temp 98.9°F | Ht 61.75 in | Wt 130.1 lb

## 2017-08-22 DIAGNOSIS — L732 Hidradenitis suppurativa: Secondary | ICD-10-CM

## 2017-08-22 DIAGNOSIS — L0292 Furuncle, unspecified: Secondary | ICD-10-CM

## 2017-08-22 MED ORDER — DOXYCYCLINE HYCLATE 100 MG PO TABS: 100.0000 mg | ORAL_TABLET | Freq: Two times a day (BID) | ORAL | 0 refills | Status: DC

## 2017-08-22 MED FILL — ESCITALOPRAM 10 MG TABLET: 10 | 30 days supply | Qty: 45 | Fill #0

## 2017-08-22 MED FILL — DOXYCYCLINE HYCLATE 100 MG: 100 | 10 days supply | Qty: 20 | Fill #0

## 2017-08-22 NOTE — Progress Notes (Signed)
HPI:  Using dictation device. Unfortunately this device frequently misinterprets words/phrases.  Acute visit for ? Boil: -reports long hx recurrent boils in area she shaves -report during surgery for hemorrhoids with doctor at [facility] last year one boil was lanced -reports surgeon told her if recurrent may need and abx -has had tender bump on L buttock for a month, sometimes with drainage -denies fevers, malaise, chills  ROS: See pertinent positives and negatives per HPI.  Past Medical History:  Diagnosis Date  . Acid reflux   . Blood in stool   . GERD (gastroesophageal reflux disease)   . Hypercholesteremia   . Hyperlipidemia   . Hypertension   . Hypertension   . Kidney stones     Past Surgical History:  Procedure Laterality Date  . CYST EXCISION PERINEAL N/A 06/13/2016   Procedure: CYST EXCISION PERIANAL, CHRONIC INFECTED;  Surgeon: Jimmye Norman, MD;  Location: [facility];  Service: General;  Laterality: N/A;  . EVALUATION UNDER ANESTHESIA WITH HEMORRHOIDECTOMY N/A 06/13/2016   Procedure: EXAM UNDER ANESTHESIA,  HEMORRHOIDECTOMY;  Surgeon: Jimmye Norman, MD;  Location: [facility];  Service: General;  Laterality: N/A;  . lumpectomy right breast  2011   benign  . TRIGGER FINGER RELEASE Right 07/31/2017   Procedure: RELEASE TRIGGER FINGER/A-1 PULLEY RIGHT RING FINGER;  Surgeon: Betha Loa, MD;  Location: [facility];  Service: Orthopedics;  Laterality: Right;    Family History  Problem Relation Age of Onset  . Cancer Mother        breast cancer at age 12  . Stroke Mother   . Hypertension Mother   . Hypertension Father     SOCIAL HX: see hpi   Current Outpatient Medications:  .  atorvastatin (LIPITOR) 20 MG tablet, Take 1 tablet (20 mg total) by mouth daily. (Patient taking differently: Take 30 mg by mouth daily. ), Disp: 90 tablet, Rfl: 1 .  escitalopram (LEXAPRO) 10 MG tablet, Take 1.5 tablets (15 mg total) by mouth daily.,  Disp: 45 tablet, Rfl: 3 .  hydrochlorothiazide (HYDRODIURIL) 25 MG tablet, Take 1 tablet (25 mg total) by mouth daily., Disp: 90 tablet, Rfl: 3 .  HYDROcodone-acetaminophen (NORCO) 5-325 MG tablet, 1-2 tabs po q6 hours prn pain, Disp: 20 tablet, Rfl: 0 .  mometasone (NASONEX) 50 MCG/ACT nasal spray, Place 2 sprays into the nose daily., Disp: 17 g, Rfl: 5 .  Multiple Vitamin (MULTIVITAMIN WITH MINERALS) TABS tablet, Take 1 tablet by mouth daily., Disp: , Rfl:  .  valACYclovir (VALTREX) 1000 MG tablet, TAKE 2 TABLETS BY MOUTH TWICE DAILY FOR 1 DAY. TAKE AT FIST SIGNS OF OUTBREAK., Disp: 4 tablet, Rfl: 3 .  doxycycline (VIBRA-TABS) 100 MG tablet, Take 1 tablet (100 mg total) by mouth 2 (two) times daily., Disp: 20 tablet, Rfl: 0  EXAM:  Vitals:   08/22/17 0928  BP: 120/62  Pulse: 84  Temp: 98.9 F (37.2 C)    Body mass index is 23.99 kg/m.  GENERAL: vitals reviewed and listed above, alert, oriented, appears well hydrated and in no acute distress  HEENT: atraumatic, conjunttiva clear, no obvious abnormalities on inspection of external nose and ears  NECK: no obvious masses on inspection  SKIN: small draining boil L upper medial buttock with ? Small area cellulitis  MS: moves all extremities without noticeable abnormality  PSYCH: pleasant and cooperative, no obvious depression or anxiety  ASSESSMENT AND PLAN:  Discussed the following assessment and plan:  Boil  Hydradenitis  -spontaneously draining,  discussed lancing vs abx/sitz -opted for doxy after discussion risks/ben, soaks and follow up in 1 week -consider topical clinda if recurrent issues -Patient advised to return or notify a doctor immediately if symptoms worsen or persist or new concerns arise.  Patient Instructions  BEFORE YOU LEAVE: -follow up: 1 week  Start the doxycycline today  Warm soaks in baking soda bath for 5 minutes daily  I hope you are feeling better soon! Seek care promptly if your symptoms  worsen, new concerns arise or you are not improving with treatment.     Terressa Koyanagi, DO

## 2017-08-22 NOTE — Patient Instructions (Signed)
BEFORE YOU LEAVE: -follow up: 1 week  Start the doxycycline today  Warm soaks in baking soda bath for 5 minutes daily  I hope you are feeling better soon! Seek care promptly if your symptoms worsen, new concerns arise or you are not improving with treatment.

## 2017-08-29 ENCOUNTER — Encounter: Payer: Self-pay | Admitting: Family Medicine

## 2017-08-29 ENCOUNTER — Ambulatory Visit: Payer: 59 | Admitting: Family Medicine

## 2017-08-29 VITALS — BP 104/80 | HR 70 | Temp 98.2°F | Ht 61.75 in | Wt 128.0 lb

## 2017-08-29 DIAGNOSIS — L732 Hidradenitis suppurativa: Secondary | ICD-10-CM

## 2017-08-29 MED ORDER — CLINDAMYCIN PHOSPHATE 1 % EX GEL: Freq: Two times a day (BID) | CUTANEOUS | 0 refills | Status: DC

## 2017-08-29 MED FILL — CLINDAMYCIN PH 1% GEL: 1 | 10 days supply | Qty: 30 | Fill #0

## 2017-08-29 NOTE — Progress Notes (Signed)
HPI:  Using dictation device. Unfortunately this device frequently misinterprets words/phrases.  Due for colon ca screening -cologuard ordered 5/9  Follow up skin infection/boil: -L buttock -seen 5/21 with doxy and soaks -? Hydradenitis -reports doing much better - pain and swelling down, no further bleeding or oozing, almost done with abx, no fever or malaise  ROS: See pertinent positives and negatives per HPI.  Past Medical History:  Diagnosis Date  . Acid reflux   . Blood in stool   . GERD (gastroesophageal reflux disease)   . Hypercholesteremia   . Hyperlipidemia   . Hypertension   . Hypertension   . Kidney stones     Past Surgical History:  Procedure Laterality Date  . CYST EXCISION PERINEAL N/A 06/13/2016   Procedure: CYST EXCISION PERIANAL, CHRONIC INFECTED;  Surgeon: Judeth Horn, MD;  Location: [facility];  Service: General;  Laterality: N/A;  . EVALUATION UNDER ANESTHESIA WITH HEMORRHOIDECTOMY N/A 06/13/2016   Procedure: EXAM UNDER ANESTHESIA,  HEMORRHOIDECTOMY;  Surgeon: Judeth Horn, MD;  Location: [facility];  Service: General;  Laterality: N/A;  . lumpectomy right breast  2011   benign  . TRIGGER FINGER RELEASE Right 07/31/2017   Procedure: RELEASE TRIGGER FINGER/A-1 PULLEY RIGHT RING FINGER;  Surgeon: Leanora Cover, MD;  Location: [facility];  Service: Orthopedics;  Laterality: Right;    Family History  Problem Relation Age of Onset  . Cancer Mother        breast cancer at age 37  . Stroke Mother   . Hypertension Mother   . Hypertension Father     SOCIAL HX: see hpi   Current Outpatient Medications:  .  atorvastatin (LIPITOR) 20 MG tablet, Take 1 tablet (20 mg total) by mouth daily. (Patient taking differently: Take 30 mg by mouth daily. ), Disp: 90 tablet, Rfl: 1 .  doxycycline (VIBRA-TABS) 100 MG tablet, Take 1 tablet (100 mg total) by mouth 2 (two) times daily., Disp: 20 tablet, Rfl: 0 .  escitalopram  (LEXAPRO) 10 MG tablet, Take 1.5 tablets (15 mg total) by mouth daily., Disp: 45 tablet, Rfl: 3 .  hydrochlorothiazide (HYDRODIURIL) 25 MG tablet, Take 1 tablet (25 mg total) by mouth daily., Disp: 90 tablet, Rfl: 3 .  HYDROcodone-acetaminophen (NORCO) 5-325 MG tablet, 1-2 tabs po q6 hours prn pain, Disp: 20 tablet, Rfl: 0 .  mometasone (NASONEX) 50 MCG/ACT nasal spray, Place 2 sprays into the nose daily., Disp: 17 g, Rfl: 5 .  Multiple Vitamin (MULTIVITAMIN WITH MINERALS) TABS tablet, Take 1 tablet by mouth daily., Disp: , Rfl:  .  valACYclovir (VALTREX) 1000 MG tablet, TAKE 2 TABLETS BY MOUTH TWICE DAILY FOR 1 DAY. TAKE AT FIST SIGNS OF OUTBREAK., Disp: 4 tablet, Rfl: 3 .  clindamycin (CLINDAGEL) 1 % gel, Apply topically 2 (two) times daily., Disp: 30 g, Rfl: 0  EXAM:  Vitals:   08/29/17 1603  BP: 104/80  Pulse: 70  Temp: 98.2 F (36.8 C)    Body mass index is 23.6 kg/m.  GENERAL: vitals reviewed and listed above, alert, oriented, appears well hydrated and in no acute distress  SKIN:healed lesion L buttock with some post inflam hyperpigmentation, no warmth, induration or fluctuance  PSYCH: pleasant and cooperative, no obvious depression or anxiety  ASSESSMENT AND PLAN:  Discussed the following assessment and plan:  Hydradenitis  -topical clinda x 3 weeks, then as needed -Patient advised to return or notify a doctor immediately if symptoms worsen or persist or new concerns arise. -  keep follow up other concerns as scheduled -advised assitant to check on colon cancer screening  Patient Instructions  BEFORE YOU LEAVE: -check on cologuard -follow up: keep follow up as scheduled  COMPLETE the colon cancer screening kit  Topical clinda as we discussed  Follow up sooner as needed   Lucretia Kern, DO

## 2017-08-29 NOTE — Patient Instructions (Addendum)
BEFORE YOU LEAVE: -check on cologuard -follow up: keep follow up as scheduled  COMPLETE the colon cancer screening kit  Topical clinda as we discussed  Follow up sooner as needed

## 2017-09-04 ENCOUNTER — Ambulatory Visit: Payer: Self-pay | Admitting: Psychology

## 2017-09-06 DIAGNOSIS — Z1211 Encounter for screening for malignant neoplasm of colon: Secondary | ICD-10-CM | POA: Diagnosis not present

## 2017-09-06 DIAGNOSIS — Z1212 Encounter for screening for malignant neoplasm of rectum: Secondary | ICD-10-CM | POA: Diagnosis not present

## 2017-09-06 LAB — COLOGUARD: COLOGUARD: NEGATIVE

## 2017-09-12 ENCOUNTER — Ambulatory Visit: Payer: Self-pay | Admitting: Psychology

## 2017-09-20 NOTE — Progress Notes (Deleted)
HPI:  Using dictation device. Unfortunately this device frequently misinterprets words/phrases.  Alice Ryan is a pleasant 57 y.o. here for follow up. Chronic medical problems summarized below were reviewed for changes and stability and were updated as needed below. These issues and their treatment remain stable for the most part. ***. Denies CP, SOB, DOE, treatment intolerance or new symptoms. Follow up on cologuard.  Hydradenitis: -topical clinda  Anxiety and depression: -Advised CBT and started Lexapro 04/2017 -seeing Dr. Felipa Furnace for CBT  Hypertension: -meds: lisinopril-hctz  Prediabetes and elevated cholesterol: -Recommended lifestyle changes -meds: atorvastatin  Cold sores: -meds: valtrex     ROS: See pertinent positives and negatives per HPI.  Past Medical History:  Diagnosis Date  . Acid reflux   . Blood in stool   . GERD (gastroesophageal reflux disease)   . Hypercholesteremia   . Hyperlipidemia   . Hypertension   . Hypertension   . Kidney stones     Past Surgical History:  Procedure Laterality Date  . CYST EXCISION PERINEAL N/A 06/13/2016   Procedure: CYST EXCISION PERIANAL, CHRONIC INFECTED;  Surgeon: Jimmye Norman, MD;  Location: [facility];  Service: General;  Laterality: N/A;  . EVALUATION UNDER ANESTHESIA WITH HEMORRHOIDECTOMY N/A 06/13/2016   Procedure: EXAM UNDER ANESTHESIA,  HEMORRHOIDECTOMY;  Surgeon: Jimmye Norman, MD;  Location: [facility];  Service: General;  Laterality: N/A;  . lumpectomy right breast  2011   benign  . TRIGGER FINGER RELEASE Right 07/31/2017   Procedure: RELEASE TRIGGER FINGER/A-1 PULLEY RIGHT RING FINGER;  Surgeon: Betha Loa, MD;  Location: [facility];  Service: Orthopedics;  Laterality: Right;    Family History  Problem Relation Age of Onset  . Cancer Mother        breast cancer at age 87  . Stroke Mother   . Hypertension Mother   . Hypertension Father     SOCIAL  HX: ***   Current Outpatient Medications:  .  atorvastatin (LIPITOR) 20 MG tablet, Take 1 tablet (20 mg total) by mouth daily. (Patient taking differently: Take 30 mg by mouth daily. ), Disp: 90 tablet, Rfl: 1 .  clindamycin (CLINDAGEL) 1 % gel, Apply topically 2 (two) times daily., Disp: 30 g, Rfl: 0 .  doxycycline (VIBRA-TABS) 100 MG tablet, Take 1 tablet (100 mg total) by mouth 2 (two) times daily., Disp: 20 tablet, Rfl: 0 .  escitalopram (LEXAPRO) 10 MG tablet, Take 1.5 tablets (15 mg total) by mouth daily., Disp: 45 tablet, Rfl: 3 .  hydrochlorothiazide (HYDRODIURIL) 25 MG tablet, Take 1 tablet (25 mg total) by mouth daily., Disp: 90 tablet, Rfl: 3 .  HYDROcodone-acetaminophen (NORCO) 5-325 MG tablet, 1-2 tabs po q6 hours prn pain, Disp: 20 tablet, Rfl: 0 .  mometasone (NASONEX) 50 MCG/ACT nasal spray, Place 2 sprays into the nose daily., Disp: 17 g, Rfl: 5 .  Multiple Vitamin (MULTIVITAMIN WITH MINERALS) TABS tablet, Take 1 tablet by mouth daily., Disp: , Rfl:  .  valACYclovir (VALTREX) 1000 MG tablet, TAKE 2 TABLETS BY MOUTH TWICE DAILY FOR 1 DAY. TAKE AT FIST SIGNS OF OUTBREAK., Disp: 4 tablet, Rfl: 3  EXAM:  There were no vitals filed for this visit.  There is no height or weight on file to calculate BMI.  GENERAL: vitals reviewed and listed above, alert, oriented, appears well hydrated and in no acute distress  HEENT: atraumatic, conjunttiva clear, no obvious abnormalities on inspection of external nose and ears  NECK: no obvious masses on inspection  LUNGS: clear to auscultation bilaterally, no wheezes, rales or rhonchi, good air movement  CV: HRRR, no peripheral edema  MS: moves all extremities without noticeable abnormality *** PSYCH: pleasant and cooperative, no obvious depression or anxiety  ASSESSMENT AND PLAN:  Discussed the following assessment and plan:  No diagnosis found.  *** -Patient advised to return or notify a doctor immediately if symptoms worsen or  persist or new concerns arise.  There are no Patient Instructions on file for this visit.  Terressa KoyanagiHannah R Kim, DO

## 2017-09-21 ENCOUNTER — Ambulatory Visit: Payer: Self-pay | Admitting: Family Medicine

## 2017-09-25 ENCOUNTER — Encounter: Payer: Self-pay | Admitting: Family Medicine

## 2017-10-13 ENCOUNTER — Ambulatory Visit
Admission: RE | Admit: 2017-10-13 | Discharge: 2017-10-13 | Disposition: A | Discharge status: Home / Self Care | Payer: 59 | Source: Ambulatory Visit | Attending: Family Medicine | Admitting: Family Medicine

## 2017-10-13 DIAGNOSIS — Z1231 Encounter for screening mammogram for malignant neoplasm of breast: Secondary | ICD-10-CM | POA: Diagnosis not present

## 2017-10-13 DIAGNOSIS — Z1239 Encounter for other screening for malignant neoplasm of breast: Secondary | ICD-10-CM

## 2017-10-16 ENCOUNTER — Other Ambulatory Visit: Payer: Self-pay | Admitting: Family Medicine

## 2017-10-16 DIAGNOSIS — R928 Other abnormal and inconclusive findings on diagnostic imaging of breast: Secondary | ICD-10-CM

## 2017-10-23 MED FILL — ESCITALOPRAM 10 MG TABLET: 10 | 30 days supply | Qty: 45 | Fill #1

## 2017-10-27 ENCOUNTER — Ambulatory Visit
Admission: RE | Admit: 2017-10-27 | Discharge: 2017-10-27 | Disposition: A | Discharge status: Home / Self Care | Payer: 59 | Source: Ambulatory Visit | Attending: Family Medicine | Admitting: Family Medicine

## 2017-10-27 ENCOUNTER — Other Ambulatory Visit: Payer: Self-pay | Admitting: Family Medicine

## 2017-10-27 ENCOUNTER — Ambulatory Visit: Payer: 59

## 2017-10-27 DIAGNOSIS — R928 Other abnormal and inconclusive findings on diagnostic imaging of breast: Secondary | ICD-10-CM

## 2017-10-27 DIAGNOSIS — R922 Inconclusive mammogram: Secondary | ICD-10-CM | POA: Diagnosis not present

## 2017-10-27 DIAGNOSIS — N6011 Diffuse cystic mastopathy of right breast: Secondary | ICD-10-CM | POA: Diagnosis not present

## 2017-10-27 DIAGNOSIS — N6310 Unspecified lump in the right breast, unspecified quadrant: Secondary | ICD-10-CM

## 2017-11-14 ENCOUNTER — Ambulatory Visit (INDEPENDENT_AMBULATORY_CARE_PROVIDER_SITE_OTHER): Payer: 59 | Admitting: Psychology

## 2017-11-14 DIAGNOSIS — F320 Major depressive disorder, single episode, mild: Secondary | ICD-10-CM

## 2018-01-12 ENCOUNTER — Other Ambulatory Visit: Payer: Self-pay | Admitting: Family Medicine

## 2018-01-12 MED FILL — ESCITALOPRAM 10 MG TABLET: 10 | 30 days supply | Qty: 45 | Fill #2

## 2018-01-12 MED FILL — HYDROCHLOROTHIAZIDE 25 MG T: 25 | 90 days supply | Qty: 90 | Fill #1

## 2018-01-26 MED FILL — ATORVASTATIN CALCIUM 20 MG: 20 | 30 days supply | Qty: 30 | Fill #0

## 2018-04-06 MED FILL — ESCITALOPRAM 10 MG TABLET: 10 | 30 days supply | Qty: 45 | Fill #3

## 2018-04-30 ENCOUNTER — Ambulatory Visit
Admission: RE | Admit: 2018-04-30 | Discharge: 2018-04-30 | Disposition: A | Discharge status: Home / Self Care | Payer: 59 | Source: Ambulatory Visit | Attending: Family Medicine | Admitting: Family Medicine

## 2018-04-30 ENCOUNTER — Other Ambulatory Visit: Payer: Self-pay | Admitting: Family Medicine

## 2018-04-30 DIAGNOSIS — N6310 Unspecified lump in the right breast, unspecified quadrant: Secondary | ICD-10-CM | POA: Diagnosis not present

## 2018-05-30 ENCOUNTER — Other Ambulatory Visit: Payer: Self-pay | Admitting: Family Medicine

## 2018-05-30 DIAGNOSIS — Q681 Congenital deformity of finger(s) and hand: Secondary | ICD-10-CM | POA: Diagnosis not present

## 2018-05-30 DIAGNOSIS — M7711 Lateral epicondylitis, right elbow: Secondary | ICD-10-CM | POA: Diagnosis not present

## 2018-05-30 MED FILL — ATORVASTATIN 20 MG TABLET: 20 | 90 days supply | Qty: 90 | Fill #0

## 2018-07-04 ENCOUNTER — Other Ambulatory Visit: Payer: Self-pay | Admitting: Family Medicine

## 2018-07-05 MED FILL — MOMETASONE FUROATE 50 MCG S: 50 | 30 days supply | Qty: 17 | Fill #0

## 2018-07-05 MED FILL — ESCITALOPRAM 10 MG TABLET: 10 | 30 days supply | Qty: 45 | Fill #0

## 2018-07-31 DIAGNOSIS — M7711 Lateral epicondylitis, right elbow: Secondary | ICD-10-CM | POA: Diagnosis not present

## 2018-08-14 ENCOUNTER — Ambulatory Visit (INDEPENDENT_AMBULATORY_CARE_PROVIDER_SITE_OTHER): Payer: 59 | Admitting: Family Medicine

## 2018-08-14 ENCOUNTER — Other Ambulatory Visit: Payer: Self-pay

## 2018-08-14 ENCOUNTER — Encounter: Payer: Self-pay | Admitting: Family Medicine

## 2018-08-14 VITALS — HR 80 | Resp 12

## 2018-08-14 DIAGNOSIS — B001 Herpesviral vesicular dermatitis: Secondary | ICD-10-CM

## 2018-08-14 DIAGNOSIS — L255 Unspecified contact dermatitis due to plants, except food: Secondary | ICD-10-CM | POA: Diagnosis not present

## 2018-08-14 DIAGNOSIS — E785 Hyperlipidemia, unspecified: Secondary | ICD-10-CM

## 2018-08-14 DIAGNOSIS — I10 Essential (primary) hypertension: Secondary | ICD-10-CM

## 2018-08-14 MED ORDER — ALUM SULFATE-CA ACETATE EX PACK: PACK | CUTANEOUS | 0 refills | Status: DC

## 2018-08-14 MED ORDER — HYDROCHLOROTHIAZIDE 25 MG PO TABS: 25.0000 mg | ORAL_TABLET | Freq: Every day | ORAL | 3 refills | Status: DC

## 2018-08-14 MED ORDER — CLOBETASOL PROPIONATE 0.05 % EX CREA: 1.0000 "application " | TOPICAL_CREAM | Freq: Two times a day (BID) | CUTANEOUS | 0 refills | Status: AC

## 2018-08-14 MED ORDER — PREDNISONE 20 MG PO TABS: 40.0000 mg | ORAL_TABLET | Freq: Every day | ORAL | 0 refills | Status: AC

## 2018-08-14 MED FILL — CLOBETASOL PROPIONATE 0.05: 0.05 | 14 days supply | Qty: 45 | Fill #0

## 2018-08-14 MED FILL — predniSONE 20 MG TABS: 20 | 5 days supply | Qty: 10 | Fill #0

## 2018-08-14 MED FILL — HYDROCHLOROTHIAZIDE 25 MG T: 25 | 90 days supply | Qty: 90 | Fill #0

## 2018-08-14 NOTE — Assessment & Plan Note (Addendum)
She does not check BP regularly but last week it was 150/90. Recommend checking BP regularly, goal < 140/90,ideally 130/80. No changes in HCTZ 25 mg daily. Continue low-salt diet. I will see her back in 12/2018.

## 2018-08-14 NOTE — Progress Notes (Signed)
Virtual Visit via Video Note   I connected with Ms Balik on 08/14/18 at  9:00 AM EDT by a video enabled telemedicine application and verified that I am speaking with the correct person using two identifiers.  Location patient: home Location provider:home office Persons participating in the virtual visit: patient, provider  I discussed the limitations of evaluation and management by telemedicine and the availability of in person appointments.She expressed understanding and agreed to proceed.   HPI: She is a 58 yo female with Hx of HTN,HLD, and allergic rhinitis among some who is establishing care today. Former PCP: Dr. Selena Batten. Last CPE: 08/07/17  HTN:  Diagnosed many years ago. Currently she is on HCTZ 25 mg daily. Before she was on lisinopril, discontinued because of cough.  Denies severe/frequent headache, visual changes, chest pain, dyspnea, palpitation, focal weakness, or edema.  Lab Results  Component Value Date   CREATININE 0.74 08/07/2017   BUN 19 08/07/2017   NA 141 08/07/2017   K 4.3 08/07/2017   CL 104 08/07/2017   CO2 29 08/07/2017   HLD: Currently she is on atorvastatin 20 mg daily. She has tolerated medication, she is not reporting side effects. Plan to follow low-fat diet.  Lab Results  Component Value Date   CHOL 234 (H) 08/07/2017   HDL 78.70 08/07/2017   LDLCALC 133 (H) 08/07/2017   LDLDIRECT 165.0 02/26/2013   TRIG 113.0 08/07/2017   CHOLHDL 3 08/07/2017   Today she is c/o pruritic rash on RUE and some on abdomen. She noted a rash about 3 days ago a few hours after she was doing yard work. This has happened before when she is exposed to leaves of a tree on her yard.She was cutting it down,she thought it was dead. Rash is not tender or erythematous. Small vesicular lesions with exudate,"weeping."  She denies any new medication, detergent, soap, or body product. No known insect bite. No sick contact.  OTC medication for this problem:  Calamine.  She denies oral lesions/edema,cough, wheezing, dyspnea, abdominal pain, nausea, or vomiting.  She takes Valtrex 1000 mg as needed for oral blisters.  Problem is exacerbated by prolonged sun exposure. Currently asymptomatic.   ROS: See pertinent positives and negatives per HPI.  Past Medical History:  Diagnosis Date  . Acid reflux   . Blood in stool   . GERD (gastroesophageal reflux disease)   . Hypercholesteremia   . Hyperlipidemia   . Hypertension   . Hypertension   . Kidney stones     Past Surgical History:  Procedure Laterality Date  . BREAST EXCISIONAL BIOPSY Left 2011   benign  . CYST EXCISION PERINEAL N/A 06/13/2016   Procedure: CYST EXCISION PERIANAL, CHRONIC INFECTED;  Surgeon: Jimmye Norman, MD;  Location: [facility];  Service: General;  Laterality: N/A;  . EVALUATION UNDER ANESTHESIA WITH HEMORRHOIDECTOMY N/A 06/13/2016   Procedure: EXAM UNDER ANESTHESIA,  HEMORRHOIDECTOMY;  Surgeon: Jimmye Norman, MD;  Location: [facility];  Service: General;  Laterality: N/A;  . lumpectomy right breast  2011   benign  . TRIGGER FINGER RELEASE Right 07/31/2017   Procedure: RELEASE TRIGGER FINGER/A-1 PULLEY RIGHT RING FINGER;  Surgeon: Betha Loa, MD;  Location: [facility];  Service: Orthopedics;  Laterality: Right;    Family History  Problem Relation Age of Onset  . Cancer Mother        breast cancer at age 24  . Stroke Mother   . Hypertension Mother   . Breast cancer Mother  70  . Hypertension Father     Social History   Socioeconomic History  . Marital status: Married    Spouse name: Not on file  . Number of children: Not on file  . Years of education: Not on file  . Highest education level: Not on file  Occupational History  . Not on file  Social Needs  . Financial resource strain: Not on file  . Food insecurity:    Worry: Not on file    Inability: Not on file  . Transportation needs:    Medical: Not on file     Non-medical: Not on file  Tobacco Use  . Smoking status: Never Smoker  . Smokeless tobacco: Never Used  Substance and Sexual Activity  . Alcohol use: Yes    Comment: social  . Drug use: No  . Sexual activity: Not on file  Lifestyle  . Physical activity:    Days per week: Not on file    Minutes per session: Not on file  . Stress: Not on file  Relationships  . Social connections:    Talks on phone: Not on file    Gets together: Not on file    Attends religious service: Not on file    Active member of club or organization: Not on file    Attends meetings of clubs or organizations: Not on file    Relationship status: Not on file  . Intimate partner violence:    Fear of current or ex partner: Not on file    Emotionally abused: Not on file    Physically abused: Not on file    Forced sexual activity: Not on file  Other Topics Concern  . Not on file  Social History Narrative  . Not on file      Current Outpatient Medications:  .  aluminum sulfate-calcium acetate (DOMEBORO) packet, Mix pack content with cold temp water and do compresses 3-4 times per day until lesion is dry., Disp: 12 each, Rfl: 0 .  atorvastatin (LIPITOR) 20 MG tablet, TAKE 1 TABLET (20 MG TOTAL) BY MOUTH DAILY., Disp: 90 tablet, Rfl: 0 .  clobetasol cream (TEMOVATE) 0.05 %, Apply 1 application topically 2 (two) times daily for 14 days., Disp: 45 g, Rfl: 0 .  escitalopram (LEXAPRO) 10 MG tablet, TAKE 1 & 1/2 TABLETS (15 MG TOTAL) BY MOUTH DAILY., Disp: 45 tablet, Rfl: 0 .  hydrochlorothiazide (HYDRODIURIL) 25 MG tablet, Take 1 tablet (25 mg total) by mouth daily., Disp: 90 tablet, Rfl: 3 .  mometasone (NASONEX) 50 MCG/ACT nasal spray, PLACE 2 SPRAYS INTO THE NOSE DAILY., Disp: 17 g, Rfl: 0 .  Multiple Vitamin (MULTIVITAMIN WITH MINERALS) TABS tablet, Take 1 tablet by mouth daily., Disp: , Rfl:  .  predniSONE (DELTASONE) 20 MG tablet, Take 2 tablets (40 mg total) by mouth daily with breakfast for 5 days., Disp:  10 tablet, Rfl: 0 .  valACYclovir (VALTREX) 1000 MG tablet, TAKE 2 TABLETS BY MOUTH TWICE DAILY FOR 1 DAY. TAKE AT FIST SIGNS OF OUTBREAK., Disp: 4 tablet, Rfl: 3  EXAM:  VITALS per patient if applicable:Pulse 80   Resp 12   LMP 09/02/2013   GENERAL: alert, oriented, appears well and in no acute distress  HEENT: atraumatic, conjunctiva clear, no obvious facial abnormalities on inspection.  NECK: normal movements of the head and neck  LUNGS: on inspection no signs of respiratory distress, breathing rate appears normal, no obvious gross SOB, gasping or wheezing  CV: no obvious cyanosis  MS: moves all visible extremities without noticeable abnormality  SKIN:Confluent vesicular rash with clear exudate on flexor area of RUE,from mid forearm to above elbow.  PSYCH/NEURO: pleasant and cooperative, no obvious depression or anxiety, speech and thought processing grossly intact  ASSESSMENT AND PLAN:  Discussed the following assessment and plan: Orders Placed This Encounter  Procedures  . Comprehensive metabolic panel  . Lipid panel    Contact dermatitis due to plants, except food, unspecified contact dermatitis type - Plan: aluminum sulfate-calcium acetate (DOMEBORO) packet, predniSONE (DELTASONE) 20 MG tablet, clobetasol cream (TEMOVATE) 0.05 % No signs of overlying infection,so I do not think abx is needed at this time.Avoid scratching and keep area clean with soap and water. Domeboro cold compresses to help with wet lesions. After discussion of some side effects, she agrees with prednisone 20 mg x 5 days, recommend taking medication in the morning with breakfast. Topical clobetasol twice daily as needed, side effects of chronic topical steroid cream discussed. Avoid trigger factors. Instructed about warning signs.   Essential hypertension She does not check BP regularly but last week it was 150/90. Recommend checking BP regularly, goal < 140/90,ideally 130/80. No changes in  HCTZ 25 mg daily. Continue low-salt diet. I will see her back in 12/2018.  Hyperlipemia No changes in atorvastatin 20 mg daily. Recommend following low-fat diet as well. We will plan on checking lipid panel later this week, lab appointment will be arranged.  Recurrent herpes labialis Recommend trying to avoid triggering factors. No changes in current management, Valtrex 1000 mg 2 tab did x 1 day.     I discussed the assessment and treatment plan with the patient. She was provided an opportunity to ask questions and all were answered. The patient agreed with the plan and demonstrated an understanding of the instructions.   The patient was advised to call back or seek an in-person evaluation if the symptoms worsen or if the condition fails to improve as anticipated.  Return in about 4 months (around 12/26/2018) for cpe.     [place], MD

## 2018-08-14 NOTE — Assessment & Plan Note (Signed)
Recommend trying to avoid triggering factors. No changes in current management, Valtrex 1000 mg 2 tab did x 1 day.

## 2018-08-14 NOTE — Assessment & Plan Note (Signed)
No changes in atorvastatin 20 mg daily. Recommend following low-fat diet as well. We will plan on checking lipid panel later this week, lab appointment will be arranged.

## 2018-08-17 ENCOUNTER — Other Ambulatory Visit: Payer: Self-pay

## 2018-08-17 ENCOUNTER — Other Ambulatory Visit (INDEPENDENT_AMBULATORY_CARE_PROVIDER_SITE_OTHER): Payer: 59

## 2018-08-17 DIAGNOSIS — I10 Essential (primary) hypertension: Secondary | ICD-10-CM | POA: Diagnosis not present

## 2018-08-17 DIAGNOSIS — E785 Hyperlipidemia, unspecified: Secondary | ICD-10-CM | POA: Diagnosis not present

## 2018-08-17 LAB — LIPID PANEL
Cholesterol: 230 mg/dL — ABNORMAL HIGH (ref 0–200)
HDL: 68 mg/dL (ref >39.00)
LDL Cholesterol: 141 mg/dL — ABNORMAL HIGH (ref 0–99)
NonHDL: 162.29
Total CHOL/HDL Ratio: 3
Triglycerides: 108 mg/dL (ref 0.0–149.0)
VLDL: 21.6 mg/dL (ref 0.0–40.0)

## 2018-08-17 LAB — COMPREHENSIVE METABOLIC PANEL
ALT: 13 U/L (ref 0–35)
AST: 14 U/L (ref 0–37)
Albumin: 4.1 g/dL (ref 3.5–5.2)
Alkaline Phosphatase: 81 U/L (ref 39–117)
BUN: 25 mg/dL — ABNORMAL HIGH (ref 6–23)
CO2: 32 mEq/L (ref 19–32)
Calcium: 9.3 mg/dL (ref 8.4–10.5)
Chloride: 103 mEq/L (ref 96–112)
Creatinine, Ser: 0.83 mg/dL (ref 0.40–1.20)
GFR: 86.01 mL/min (ref >60.00)
Glucose, Bld: 98 mg/dL (ref 70–99)
Potassium: 3.4 mEq/L — ABNORMAL LOW (ref 3.5–5.1)
Sodium: 144 mEq/L (ref 135–145)
Total Bilirubin: 0.6 mg/dL (ref 0.2–1.2)
Total Protein: 6.9 g/dL (ref 6.0–8.3)

## 2018-08-23 ENCOUNTER — Telehealth: Payer: Self-pay | Admitting: *Deleted

## 2018-08-23 NOTE — Telephone Encounter (Signed)
Patient given results and recommendations and verbalized understanding.  Copied from CRM [id number]. Topic: General - Inquiry >> Aug 23, 2018 10:16 AM Lynne Logan D wrote: Reason for CRM: Pt returned a call from practice regarding lab results from 08/17/18. Please return call. [phone]

## 2018-10-03 ENCOUNTER — Encounter: Payer: Self-pay | Admitting: Family Medicine

## 2018-10-03 ENCOUNTER — Other Ambulatory Visit: Payer: Self-pay

## 2018-10-03 ENCOUNTER — Ambulatory Visit (INDEPENDENT_AMBULATORY_CARE_PROVIDER_SITE_OTHER): Payer: 59 | Admitting: Family Medicine

## 2018-10-03 DIAGNOSIS — F419 Anxiety disorder, unspecified: Secondary | ICD-10-CM | POA: Diagnosis not present

## 2018-10-03 DIAGNOSIS — F411 Generalized anxiety disorder: Secondary | ICD-10-CM | POA: Insufficient documentation

## 2018-10-03 MED ORDER — SERTRALINE HCL 50 MG PO TABS: 50.0000 mg | ORAL_TABLET | Freq: Every day | ORAL | 3 refills | Status: DC

## 2018-10-03 MED FILL — SERTRALINE HCL 50 MG TABLET: 50 | 30 days supply | Qty: 30 | Fill #0

## 2018-10-03 NOTE — Progress Notes (Signed)
Virtual Visit via Video Note   I connected with [name] on 10/03/18 at  2:30 PM EDT by a video enabled telemedicine application and verified that I am speaking with the correct person using two identifiers.  Location patient: home Location provider:home office Persons participating in the virtual visit: patient, provider  I discussed the limitations of evaluation and management by telemedicine and the availability of in person appointments. The patient expressed understanding and agreed to proceed.   HPI: [name] is a 58 yo female c/o anxiety getting worse,"my nerves" She is on Lexapro 15 mg daily,dose increased from 10 mg in 07/2018.  She is not sure for long symptoms are getting worse,she thinks medication has not helped. Increased strest. Father 391 yo, she is his caregiver. He keeps getting worse and licence taken from him,so level or care he needs has increased.  Upset easily. She did therapy before but did not work.  Hx of anxiety for 5 years after her mother died,she was also her caregiver. She is the only child and has no help with taking care of her father.. Denies depression or suicidal thoughts.  She has not tried other medications.  She wonders if she needs a note for work,so she can miss days if needed. She works 3 times per week.  ROS: See pertinent positives and negatives per HPI.  Past Medical History:  Diagnosis Date  . Acid reflux   . Blood in stool   . GERD (gastroesophageal reflux disease)   . Hypercholesteremia   . Hyperlipidemia   . Hypertension   . Hypertension   . Kidney stones     Past Surgical History:  Procedure Laterality Date  . BREAST EXCISIONAL BIOPSY Left 2011   benign  . CYST EXCISION PERINEAL N/A 06/13/2016   Procedure: CYST EXCISION PERIANAL, CHRONIC INFECTED;  Surgeon: Jimmye NormanJames Wyatt, MD;  Location: [facility];  Service: General;  Laterality: N/A;  . EVALUATION UNDER ANESTHESIA WITH HEMORRHOIDECTOMY N/A  06/13/2016   Procedure: EXAM UNDER ANESTHESIA,  HEMORRHOIDECTOMY;  Surgeon: Jimmye NormanJames Wyatt, MD;  Location: [facility];  Service: General;  Laterality: N/A;  . lumpectomy right breast  2011   benign  . TRIGGER FINGER RELEASE Right 07/31/2017   Procedure: RELEASE TRIGGER FINGER/A-1 PULLEY RIGHT RING FINGER;  Surgeon: Betha LoaKuzma, Kevin, MD;  Location: [facility];  Service: Orthopedics;  Laterality: Right;    Family History  Problem Relation Age of Onset  . Cancer Mother        breast cancer at age 56  . Stroke Mother   . Hypertension Mother   . Breast cancer Mother [id number]  . Hypertension Father     Social History   Socioeconomic History  . Marital status: Married    Spouse name: Not on file  . Number of children: Not on file  . Years of education: Not on file  . Highest education level: Not on file  Occupational History  . Not on file  Social Needs  . Financial resource strain: Not on file  . Food insecurity    Worry: Not on file    Inability: Not on file  . Transportation needs    Medical: Not on file    Non-medical: Not on file  Tobacco Use  . Smoking status: Never Smoker  . Smokeless tobacco: Never Used  Substance and Sexual Activity  . Alcohol use: Yes    Comment: social  . Drug use: No  . Sexual activity: Not on file  Lifestyle  . Physical activity    Days per week: Not on file    Minutes per session: Not on file  . Stress: Not on file  Relationships  . Social Herbalist on phone: Not on file    Gets together: Not on file    Attends religious service: Not on file    Active member of club or organization: Not on file    Attends meetings of clubs or organizations: Not on file    Relationship status: Not on file  . Intimate partner violence    Fear of current or ex partner: Not on file    Emotionally abused: Not on file    Physically abused: Not on file    Forced sexual activity: Not on file  Other Topics Concern  . Not on file   Social History Narrative  . Not on file     Current Outpatient Medications:  .  aluminum sulfate-calcium acetate (DOMEBORO) packet, Mix pack content with cold temp water and do compresses 3-4 times per day until lesion is dry., Disp: 12 each, Rfl: 0 .  atorvastatin (LIPITOR) 20 MG tablet, TAKE 1 TABLET (20 MG TOTAL) BY MOUTH DAILY., Disp: 90 tablet, Rfl: 0 .  escitalopram (LEXAPRO) 10 MG tablet, TAKE 1 & 1/2 TABLETS (15 MG TOTAL) BY MOUTH DAILY., Disp: 45 tablet, Rfl: 0 .  hydrochlorothiazide (HYDRODIURIL) 25 MG tablet, Take 1 tablet (25 mg total) by mouth daily., Disp: 90 tablet, Rfl: 3 .  mometasone (NASONEX) 50 MCG/ACT nasal spray, PLACE 2 SPRAYS INTO THE NOSE DAILY., Disp: 17 g, Rfl: 0 .  Multiple Vitamin (MULTIVITAMIN WITH MINERALS) TABS tablet, Take 1 tablet by mouth daily., Disp: , Rfl:  .  sertraline (ZOLOFT) 50 MG tablet, Take 1 tablet (50 mg total) by mouth daily., Disp: 30 tablet, Rfl: 3 .  valACYclovir (VALTREX) 1000 MG tablet, TAKE 2 TABLETS BY MOUTH TWICE DAILY FOR 1 DAY. TAKE AT FIST SIGNS OF OUTBREAK., Disp: 4 tablet, Rfl: 3  EXAM:  VITALS per patient if applicable:N/A  GENERAL: alert, oriented, appears well and in no acute distress  HEENT: atraumatic, normocephalic,conjunttiva clear.  LUNGS: on inspection no signs of respiratory distress, breathing rate appears normal, no obvious gross SOB, gasping or wheezing  CV: no obvious cyanosis  PSYCH/NEURO: pleasant and cooperative, no obvious depression or anxiety, speech and thought processing grossly intact  ASSESSMENT AND PLAN:  Discussed the following assessment and plan:   Anxiety disorder Not well controlled. Other pharmacologic options discussed as well as side effects. She agrees with trying Sertraline 50 mg, start with 1/2 tab x 7-10 days then increased to whole tab if tolerated. Instructed about warning signs. In regard to FMLA I do not think it is needed at this time from the anxiety stand of point.But if  she needs time to take care of her father,she could request FMLA from her father's PCP,if appropriate.     I discussed the assessment and treatment plan with the patient. She was provided an opportunity to ask questions and all were answered. The patient agreed with the plan and demonstrated an understanding of the instructions.   Return in about 6 weeks (around 11/14/2018) for anxiety.    Sevag Shearn Martinique, MD

## 2018-10-03 NOTE — Assessment & Plan Note (Signed)
Not well controlled. Other pharmacologic options discussed as well as side effects. She agrees with trying Sertraline 50 mg, start with 1/2 tab x 7-10 days then increased to whole tab if tolerated. Instructed about warning signs. In regard to FMLA I do not think it is needed at this time from the anxiety stand of point.But if she needs time to take care of her father,she could request FMLA from her father's PCP,if appropriate.

## 2018-10-22 ENCOUNTER — Telehealth: Payer: Self-pay | Admitting: Family Medicine

## 2018-10-22 NOTE — Telephone Encounter (Signed)
Patient offered virtual visit with another provider in the office due to PCP being on vacation until 10/29/2018. Patient declined at this time and stated that she would just use the Abreva and as far as the change in medication go, she will wait until PCP return to the office if needed.

## 2018-10-22 NOTE — Telephone Encounter (Signed)
Copied from [place] [id number]. Topic: General - Inquiry >> Oct 22, 2018  3:34 PM [name] wrote: Reason for CRM: pt called in and stated that she has a sore on her upper lip and when she goes out in the sun she stated she gets them more often.  She would like to if she could get a full script of valACYclovir (VALTREX) 1000 MG tablet [[id number]] for when she get break outs.   She also stated that she was put on the sertraline (ZOLOFT) 50 MG tablet [[id number].  She stated that it makes her break up in a small rash and she does not want to take that.  She stated she would like to take what she was taking before.  Escitalopram (1 and 1/2 Tabs) is what she was on before  [place] number [phone]

## 2018-10-24 ENCOUNTER — Other Ambulatory Visit: Payer: Self-pay

## 2018-10-24 ENCOUNTER — Ambulatory Visit (INDEPENDENT_AMBULATORY_CARE_PROVIDER_SITE_OTHER): Payer: 59 | Admitting: Family Medicine

## 2018-10-24 DIAGNOSIS — L259 Unspecified contact dermatitis, unspecified cause: Secondary | ICD-10-CM | POA: Diagnosis not present

## 2018-10-24 DIAGNOSIS — B001 Herpesviral vesicular dermatitis: Secondary | ICD-10-CM

## 2018-10-24 MED ORDER — PREDNISONE 10 MG PO TABS: ORAL_TABLET | ORAL | 0 refills | Status: DC

## 2018-10-24 MED ORDER — VALACYCLOVIR HCL 1 G PO TABS: ORAL_TABLET | ORAL | 1 refills | Status: DC

## 2018-10-24 MED FILL — valACYclovir HCL 1 GM TABS: 1 | 30 days supply | Qty: 30 | Fill #0

## 2018-10-24 MED FILL — predniSONE 10 MG TABS: 10 | 10 days supply | Qty: 28 | Fill #0

## 2018-10-24 NOTE — Telephone Encounter (Signed)
Relation to pt: self  Call back number: [phone]    Reason for call:  Patient would like to set up a virtual visit for today as soon as possible. (attempted to reach the office and was unsuccessful) please advise

## 2018-10-24 NOTE — Progress Notes (Signed)
Patient ID: Alice Ryan, female   DOB: 23-Sep-1960, 57 y.o.   MRN: [id number]  This visit type was conducted due to national recommendations for restrictions regarding the COVID-19 pandemic in an effort to limit this patient's exposure and mitigate transmission in our community.   Virtual Visit via Video Note  I connected with Alice Ryan on 10/24/18 at  4:15 PM EDT by a video enabled telemedicine application and verified that I am speaking with the correct person using two identifiers.  Location patient: home Location provider:work or home office Persons participating in the virtual visit: patient, provider  I discussed the limitations of evaluation and management by telemedicine and the availability of in person appointments. The patient expressed understanding and agreed to proceed.   HPI: Patient has history of recurrent cold sores.  She has a sore upper lip which came up Monday.  She has used Valtrex in the past but did not have any leftover.  Requesting refills.  She has noted that sunlight seem to bring on her cold sores  Recurrent rash right forearm.  Pruritic and vesicular and slightly raised.  Nonpainful.  She had similar rash in May which eventually cleared with prednisone.  She works outside in the yard a lot   ROS: See pertinent positives and negatives per HPI.  Past Medical History:  Diagnosis Date  . Acid reflux   . Blood in stool   . GERD (gastroesophageal reflux disease)   . Hypercholesteremia   . Hyperlipidemia   . Hypertension   . Hypertension   . Kidney stones     Past Surgical History:  Procedure Laterality Date  . BREAST EXCISIONAL BIOPSY Left 2011   benign  . CYST EXCISION PERINEAL N/A 06/13/2016   Procedure: CYST EXCISION PERIANAL, CHRONIC INFECTED;  Surgeon: Judeth Horn, MD;  Location: [place];  Service: General;  Laterality: N/A;  . EVALUATION UNDER ANESTHESIA WITH HEMORRHOIDECTOMY N/A 06/13/2016   Procedure: EXAM UNDER  ANESTHESIA,  HEMORRHOIDECTOMY;  Surgeon: Judeth Horn, MD;  Location: [place];  Service: General;  Laterality: N/A;  . lumpectomy right breast  2011   benign  . TRIGGER FINGER RELEASE Right 07/31/2017   Procedure: RELEASE TRIGGER FINGER/A-1 PULLEY RIGHT RING FINGER;  Surgeon: Leanora Cover, MD;  Location: [place];  Service: Orthopedics;  Laterality: Right;    Family History  Problem Relation Age of Onset  . Cancer Mother        breast cancer at age 53  . Stroke Mother   . Hypertension Mother   . Breast cancer Mother 64  . Hypertension Father     SOCIAL HX: Non-smoker   Current Outpatient Medications:  .  aluminum sulfate-calcium acetate (DOMEBORO) packet, Mix pack content with cold temp water and do compresses 3-4 times per day until lesion is dry., Disp: 12 each, Rfl: 0 .  atorvastatin (LIPITOR) 20 MG tablet, TAKE 1 TABLET (20 MG TOTAL) BY MOUTH DAILY., Disp: 90 tablet, Rfl: 0 .  escitalopram (LEXAPRO) 10 MG tablet, TAKE 1 & 1/2 TABLETS (15 MG TOTAL) BY MOUTH DAILY., Disp: 45 tablet, Rfl: 0 .  hydrochlorothiazide (HYDRODIURIL) 25 MG tablet, Take 1 tablet (25 mg total) by mouth daily., Disp: 90 tablet, Rfl: 3 .  mometasone (NASONEX) 50 MCG/ACT nasal spray, PLACE 2 SPRAYS INTO THE NOSE DAILY., Disp: 17 g, Rfl: 0 .  Multiple Vitamin (MULTIVITAMIN WITH MINERALS) TABS tablet, Take 1 tablet by mouth daily., Disp: , Rfl:  .  predniSONE (DELTASONE) 10  MG tablet, Taper as follows: 4-4-4-4-3-3-2-2-1-1, Disp: 28 tablet, Rfl: 0 .  sertraline (ZOLOFT) 50 MG tablet, Take 1 tablet (50 mg total) by mouth daily., Disp: 30 tablet, Rfl: 3 .  valACYclovir (VALTREX) 1000 MG tablet, TAKE 2 TABLETS BY MOUTH TWICE DAILY FOR 1 DAY. TAKE AT FIST SIGNS OF OUTBREAK., Disp: 30 tablet, Rfl: 1  EXAM:  VITALS per patient if applicable:  GENERAL: alert, oriented, appears well and in no acute distress  HEENT: atraumatic, conjunttiva clear, no obvious abnormalities on inspection  of external nose and ears  NECK: normal movements of the head and neck  LUNGS: on inspection no signs of respiratory distress, breathing rate appears normal, no obvious gross SOB, gasping or wheezing  CV: no obvious cyanosis  MS: moves all visible extremities without noticeable abnormality  PSYCH/NEURO: pleasant and cooperative, no obvious depression or anxiety, speech and thought processing grossly intact  ASSESSMENT AND PLAN:  Discussed the following assessment and plan:  Recurrent herpes labialis  -Refill Valtrex 1 g take 2 at onset cold sore and two 12 hours later -Consider sunblock if prolonged sun exposure  Contact dermatitis right forearm -Prednisone taper     I discussed the assessment and treatment plan with the patient. The patient was provided an opportunity to ask questions and all were answered. The patient agreed with the plan and demonstrated an understanding of the instructions.   The patient was advised to call back or seek an in-person evaluation if the symptoms worsen or if the condition fails to improve as anticipated.   Evelena PeatBruce Burchette, MD

## 2018-10-24 NOTE — Telephone Encounter (Signed)
Spoke with patient and scheduled virtual visit for this afternoon with Dr. Elease Hashimoto.

## 2018-10-30 ENCOUNTER — Other Ambulatory Visit: Payer: Self-pay

## 2018-10-30 ENCOUNTER — Ambulatory Visit
Admission: RE | Admit: 2018-10-30 | Discharge: 2018-10-30 | Disposition: A | Discharge status: Home / Self Care | Payer: 59 | Source: Ambulatory Visit | Attending: Family Medicine | Admitting: Family Medicine

## 2018-10-30 ENCOUNTER — Other Ambulatory Visit: Payer: Self-pay | Admitting: Family Medicine

## 2018-10-30 DIAGNOSIS — R922 Inconclusive mammogram: Secondary | ICD-10-CM | POA: Diagnosis not present

## 2018-10-30 DIAGNOSIS — N6310 Unspecified lump in the right breast, unspecified quadrant: Secondary | ICD-10-CM

## 2018-10-30 DIAGNOSIS — N6011 Diffuse cystic mastopathy of right breast: Secondary | ICD-10-CM | POA: Diagnosis not present

## 2018-10-30 DIAGNOSIS — N6489 Other specified disorders of breast: Secondary | ICD-10-CM

## 2018-11-05 ENCOUNTER — Ambulatory Visit
Admission: RE | Admit: 2018-11-05 | Discharge: 2018-11-05 | Disposition: A | Discharge status: Home / Self Care | Payer: 59 | Source: Ambulatory Visit | Attending: Family Medicine | Admitting: Family Medicine

## 2018-11-05 ENCOUNTER — Other Ambulatory Visit: Payer: Self-pay

## 2018-11-05 DIAGNOSIS — N6489 Other specified disorders of breast: Secondary | ICD-10-CM

## 2018-11-05 DIAGNOSIS — N6313 Unspecified lump in the right breast, lower outer quadrant: Secondary | ICD-10-CM | POA: Diagnosis not present

## 2018-11-16 ENCOUNTER — Ambulatory Visit: Payer: Self-pay | Admitting: Surgery

## 2018-11-16 DIAGNOSIS — N6489 Other specified disorders of breast: Secondary | ICD-10-CM

## 2018-11-16 NOTE — H&P (Signed)
Alice Ryan Documented: 11/16/2018 10:13 AM Location: [facility] Patient #: [id number] DOB: 09/19/1960 Married / Language: English / Race: Black or African American Female  History of Present Illness (Miquela Costabile A. Drema Eddington MD; 11/16/2018 10:37 AM) Patient words: Patient is sent at the request of the [facility] per Dr. jarosz for screening detected right breast mammographic abnormality. She has been followed for an area of density in the right breast retroareolar which are changed on her 6 month follow-up. Core biopsy was performed which showed a complex sclerosing lesion/radial scar. Patient is sore for biopsy but otherwise has no other complaints. History of left breast lumpectomy for benign disease in the past. Her mother had breast cancer in the past. She has no mass, drainage or change in the appearance of either breast.           Diagnosis Breast, right, needle core biopsy, 7 o'clock - COMPLEX SCLEROSING LESION WITH CALCIFICATIONS.     Follow-up for probably benign right breast mass. History of benign left breast excision 2011. Family history of breast cancer with mother diagnosed with breast cancer at age 70.  EXAM: DIGITAL DIAGNOSTIC BILATERAL MAMMOGRAM WITH CAD AND TOMO  RIGHT BREAST ULTRASOUND  COMPARISON: Previous exam(s).  ACR Breast Density Category c: The breast tissue is heterogeneously dense, which may obscure small masses.  FINDINGS: There is a subtle area of distortion in the right breast best visualized on the CC tomograms. The oval circumscribed probably benign mass in the right breast appears stable to slightly smaller when compared to the prior exams. No suspicious masses or calcifications are seen in the left breast.  Mammographic images were processed with CAD.  Targeted ultrasound of the right breast was performed. The benign appearing cluster of cysts at the 8:30 position 1 cm from the nipple measures 1 x  0.6 x 0.7 cm, stable. Additional numerous similar appearing tiny cysts and clusters of cysts are seen throughout the central right breast. No definite sonographic correlate for the subtle distortion seen in the right breast at mammography. No axillary lymphadenopathy seen.  IMPRESSION: Right breast distortion, a subtle finding best visualized on the CC tomograms.  RECOMMENDATION: Stereotactic guided biopsy of the distortion in the right breast is recommended. This will be scheduled for the patient.  I have discussed the findings and recommendations with the patient. Results were also provided in writing at the conclusion of the visit. If applicable, a reminder letter will be sent to the patient regarding the next appointment.  BI-RADS CATEGORY 4: Suspicious.   Electronically Signed By: Jennifer Jarosz M.D.  The patient is a 58 year old female.   Past Surgical History (Sabrina Canty, CMA; 11/16/2018 10:13 AM) Breast Biopsy Bilateral.  Diagnostic Studies History (Sabrina Canty, CMA; 11/16/2018 10:13 AM) Colonoscopy 1-5 years ago 5-10 years ago Mammogram 1-3 years ago  Allergies (Sabrina Canty, CMA; 11/16/2018 10:14 AM) APPLE FISH Peanut Butter Flavor *PHARMACEUTICAL ADJUVANTS* SHELLFISH Allergies Reconciled  Medication History (Sabrina Canty, CMA; 11/16/2018 10:14 AM) Atorvastatin Calcium (20MG Tablet, Oral) Active. Lisinopril (10MG Tablet, Oral) Active. ValACYclovir HCl (1GM Tablet, Oral) Active. Lipitor (20MG Tablet, Oral) Active. Nasonex (50MCG/ACT Suspension, Nasal) Active. Multiple Vitamins-Minerals (Oral) Active. Medications Reconciled  Social History (Sabrina Canty, CMA; 11/16/2018 10:13 AM) Alcohol use Occasional alcohol use. Caffeine use Carbonated beverages, Coffee, Tea. Tobacco use Never smoker.  Family History (Sabrina Canty, CMA; 11/16/2018 10:13 AM) Breast Cancer Mother. Cancer Mother. Hypertension Father, Mother.  Pregnancy  / Birth History (Sabrina Canty, CMA; 11/16/2018 10:13 AM)   Age at menarche 39 years, 64 years. Gravida 4 Irregular periods Maternal age 79-20 Para 4  Other Problems [name], CMA; 11/16/2018 10:13 AM) Gastroesophageal Reflux Disease High blood pressure Hypercholesterolemia     Review of Systems (Sabrina Canty CMA; 11/16/2018 10:13 AM) General Not Present- Appetite Loss, Chills, Fatigue, Fever, Night Sweats, Weight Gain and Weight Loss. Skin Not Present- Change in Wart/Mole, Dryness, Hives, Jaundice, New Lesions, Non-Healing Wounds, Rash and Ulcer. HEENT Not Present- Earache, Hearing Loss, Hoarseness, Nose Bleed, Oral Ulcers, Ringing in the Ears, Seasonal Allergies, Sinus Pain, Sore Throat, Visual Disturbances, Wears glasses/contact lenses and Yellow Eyes. Respiratory Not Present- Bloody sputum, Chronic Cough, Difficulty Breathing, Snoring and Wheezing. Breast Not Present- Breast Mass, Breast Pain, Nipple Discharge and Skin Changes. Cardiovascular Not Present- Chest Pain, Difficulty Breathing Lying Down, Leg Cramps, Palpitations, Rapid Heart Rate, Shortness of Breath and Swelling of Extremities. Gastrointestinal Not Present- Abdominal Pain, Bloating, Bloody Stool, Change in Bowel Habits, Chronic diarrhea, Constipation, Difficulty Swallowing, Excessive gas, Gets full quickly at meals, Hemorrhoids, Indigestion, Nausea, Rectal Pain and Vomiting. Female Genitourinary Not Present- Frequency, Nocturia, Painful Urination, Pelvic Pain and Urgency. Musculoskeletal Not Present- Back Pain, Joint Pain, Joint Stiffness, Muscle Pain, Muscle Weakness and Swelling of Extremities. Neurological Not Present- Decreased Memory, Fainting, Headaches, Numbness, Seizures, Tingling, Tremor, Trouble walking and Weakness. Psychiatric Not Present- Anxiety, Bipolar, Change in Sleep Pattern, Depression, Fearful and Frequent crying. Endocrine Not Present- Cold Intolerance, Excessive Hunger, Hair Changes, Heat  Intolerance, Hot flashes and New Diabetes. Hematology Not Present- Blood Thinners, Easy Bruising, Excessive bleeding, Gland problems, HIV and Persistent Infections.  Vitals (Sabrina Canty CMA; 11/16/2018 10:14 AM) 11/16/2018 10:14 AM Weight: 141.25 lb Height: 61in Body Surface Area: 1.63 m Body Mass Index: 26.69 kg/m  Temp.: 26F(Temporal)  Pulse: 99 (Regular)  BP: 146/86 (Sitting, Left Arm, Standard)        Physical Exam (Beya Tipps A. Allis Quirarte MD; 11/16/2018 10:38 AM)  General Mental Status-Alert. General Appearance-Consistent with stated age. Hydration-Well hydrated. Voice-Normal.  Chest and Lung Exam Chest and lung exam reveals -quiet, even and easy respiratory effort with no use of accessory muscles and on auscultation, normal breath sounds, no adventitious sounds and normal vocal resonance. Inspection Chest Wall - Normal. Back - normal.  Breast Note: Left breast scar noted. No masses. Right breast bruising noted without mass lesion. No evidence of nipple discharge bilaterally.  Cardiovascular Cardiovascular examination reveals -normal heart sounds, regular rate and rhythm with no murmurs and normal pedal pulses bilaterally.  Neurologic Neurologic evaluation reveals -alert and oriented x 3 with no impairment of recent or remote memory. Mental Status-Normal.  Lymphatic Head & Neck  General Head & Neck Lymphatics: Bilateral - Description - Normal. Axillary  General Axillary Region: Bilateral - Description - Normal. Tenderness - Non Tender.    Assessment & Plan (Nazareth Kirk A. 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 Discussed potential malignant risk with radial scar. She is opted for right breast seed lumpectomy. Risk of lumpectomy include bleeding, infection, seroma, more surgery, use of seed/wire, wound care, cosmetic deformity and the need for  other treatments, death , blood clots, death. Pt agrees to proceed.  Current Plans Pt Education - [facility] Breast Biopsy HCI: discussed with patient and provided information. The anatomy and the physiology was discussed. The pathophysiology and natural history of the disease was discussed. Options were discussed and recommendations were made. Technique, risks, benefits, & alternatives were discussed. Risks  such as stroke, heart attack, bleeding, indection, death, and other risks discussed. Questions answered. The patient agrees to proceed.

## 2018-11-16 NOTE — H&P (View-Only) (Signed)
Alice MerlesLisa A Ryan Documented: 11/16/2018 10:13 AM Location: [facility] Patient #: [id number] DOB: 12/03/1960 Married / Language: English / Race: Black or African American Female  History of Present Illness Alice Ryan(Alice Campo A. Carter Kaman MD; 11/16/2018 10:37 AM) Patient words: [facility] Derinda Ryan for screening detected right breast mammographic abnormality. She has been followed for an area of density in the right breast retroareolar which are changed on her 6 month follow-up. Core biopsy was performed which showed a complex sclerosing lesion/radial scar. Patient is sore for biopsy but otherwise has no other complaints. History of left breast lumpectomy for benign disease in the past. Her mother had breast cancer in the past. She has no mass, drainage or change in the appearance of either breast.           Diagnosis Breast, right, needle core biopsy, 7 o'clock - COMPLEX SCLEROSING LESION WITH CALCIFICATIONS.     Follow-up for probably benign right breast mass. History of benign left breast excision 2011. Family history of breast cancer with mother diagnosed with breast cancer at age 56.  EXAM: DIGITAL DIAGNOSTIC BILATERAL MAMMOGRAM WITH CAD AND TOMO  RIGHT BREAST ULTRASOUND  COMPARISON: Previous exam(s).  ACR Breast Density Category c: The breast tissue is heterogeneously dense, which may obscure small masses.  FINDINGS: There is a subtle area of distortion in the right breast best visualized on the CC tomograms. The oval circumscribed probably benign mass in the right breast appears stable to slightly smaller when compared to the prior exams. No suspicious masses or calcifications are seen in the left breast.  Mammographic images were processed with CAD.  Targeted ultrasound of the right breast was performed. The benign appearing cluster of cysts at the 8:30 position 1 cm from the nipple measures 1 x  0.6 x 0.7 cm, stable. Additional numerous similar appearing tiny cysts and clusters of cysts are seen throughout the central right breast. No definite sonographic correlate for the subtle distortion seen in the right breast at mammography. No axillary lymphadenopathy seen.  IMPRESSION: Right breast distortion, a subtle finding best visualized on the CC tomograms.  RECOMMENDATION: Stereotactic guided biopsy of the distortion in the right breast is recommended. This will be scheduled for the patient.  I have discussed the findings and recommendations with the patient. Results were also provided in writing at the conclusion of the visit. If applicable, a reminder letter will be sent to the patient regarding the next appointment.  BI-RADS CATEGORY 4: Suspicious.   Electronically Signed By: Alice CapJennifer Ryan M.D.  The patient is a 58 year old female.   Past Surgical History Alice Ryan(Alice Ryan, CMA; 11/16/2018 10:13 AM) Breast Biopsy Bilateral.  Diagnostic Studies History Alice Ryan(Alice Ryan, CMA; 11/16/2018 10:13 AM) Colonoscopy 1-5 years ago 5-10 years ago Mammogram 1-3 years ago  Allergies Alice Ryan(Alice Ryan, CMA; 11/16/2018 10:14 AM) APPLE FISH Peanut Butter Flavor *PHARMACEUTICAL ADJUVANTS* SHELLFISH Allergies Reconciled  Medication History (Alice Ryan, CMA; 11/16/2018 10:14 AM) Atorvastatin Calcium (20MG  Tablet, Oral) Active. Lisinopril (10MG  Tablet, Oral) Active. ValACYclovir HCl (1GM Tablet, Oral) Active. Lipitor (20MG  Tablet, Oral) Active. Nasonex (50MCG/ACT Suspension, Nasal) Active. Multiple Vitamins-Minerals (Oral) Active. Medications Reconciled  Social History Alice Ryan(Alice Ryan, CMA; 11/16/2018 10:13 AM) Alcohol use Occasional alcohol use. Caffeine use Carbonated beverages, Coffee, Tea. Tobacco use Never smoker.  Family History Alice Ryan(Alice Ryan, CMA; 11/16/2018 10:13 AM) Breast Cancer Mother. Cancer Mother. Hypertension Father, Mother.  Pregnancy  / Birth History Alice Ryan(Alice Ryan, CMA; 11/16/2018 10:13 AM)  Age at menarche 39 years, 64 years. Gravida 4 Irregular periods Maternal age 79-20 Para 4  Other Problems [name], CMA; 11/16/2018 10:13 AM) Gastroesophageal Reflux Disease High blood pressure Hypercholesterolemia     Review of Systems (Alice Ryan CMA; 11/16/2018 10:13 AM) General Not Present- Appetite Loss, Chills, Fatigue, Fever, Night Sweats, Weight Gain and Weight Loss. Skin Not Present- Change in Wart/Mole, Dryness, Hives, Jaundice, New Lesions, Non-Healing Wounds, Rash and Ulcer. HEENT Not Present- Earache, Hearing Loss, Hoarseness, Nose Bleed, Oral Ulcers, Ringing in the Ears, Seasonal Allergies, Sinus Pain, Sore Throat, Visual Disturbances, Wears glasses/contact lenses and Yellow Eyes. Respiratory Not Present- Bloody sputum, Chronic Cough, Difficulty Breathing, Snoring and Wheezing. Breast Not Present- Breast Mass, Breast Pain, Nipple Discharge and Skin Changes. Cardiovascular Not Present- Chest Pain, Difficulty Breathing Lying Down, Leg Cramps, Palpitations, Rapid Heart Rate, Shortness of Breath and Swelling of Extremities. Gastrointestinal Not Present- Abdominal Pain, Bloating, Bloody Stool, Change in Bowel Habits, Chronic diarrhea, Constipation, Difficulty Swallowing, Excessive gas, Gets full quickly at meals, Hemorrhoids, Indigestion, Nausea, Rectal Pain and Vomiting. Female Genitourinary Not Present- Frequency, Nocturia, Painful Urination, Pelvic Pain and Urgency. Musculoskeletal Not Present- Back Pain, Joint Pain, Joint Stiffness, Muscle Pain, Muscle Weakness and Swelling of Extremities. Neurological Not Present- Decreased Memory, Fainting, Headaches, Numbness, Seizures, Tingling, Tremor, Trouble walking and Weakness. Psychiatric Not Present- Anxiety, Bipolar, Change in Sleep Pattern, Depression, Fearful and Frequent crying. Endocrine Not Present- Cold Intolerance, Excessive Hunger, Hair Changes, Heat  Intolerance, Hot flashes and New Diabetes. Hematology Not Present- Blood Thinners, Easy Bruising, Excessive bleeding, Gland problems, HIV and Persistent Infections.  Vitals (Alice Ryan CMA; 11/16/2018 10:14 AM) 11/16/2018 10:14 AM Weight: 141.25 lb Height: 61in Body Surface Area: 1.63 m Body Mass Index: 26.69 kg/m  Temp.: 26F(Temporal)  Pulse: 99 (Regular)  BP: 146/86 (Sitting, Left Arm, Standard)        Physical Exam (Carrell Rahmani A. Herman Fiero MD; 11/16/2018 10:38 AM)  General Mental Status-Alert. General Appearance-Consistent with stated age. Hydration-Well hydrated. Voice-Normal.  Chest and Lung Exam Chest and lung exam reveals -quiet, even and easy respiratory effort with no use of accessory muscles and on auscultation, normal breath sounds, no adventitious sounds and normal vocal resonance. Inspection Chest Wall - Normal. Back - normal.  Breast Note: Left breast scar noted. No masses. Right breast bruising noted without mass lesion. No evidence of nipple discharge bilaterally.  Cardiovascular Cardiovascular examination reveals -normal heart sounds, regular rate and rhythm with no murmurs and normal pedal pulses bilaterally.  Neurologic Neurologic evaluation reveals -alert and oriented x 3 with no impairment of recent or remote memory. Mental Status-Normal.  Lymphatic Head & Neck  General Head & Neck Lymphatics: Bilateral - Description - Normal. Axillary  General Axillary Region: Bilateral - Description - Normal. Tenderness - Non Tender.    Assessment & Plan (Idabell Picking A. 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 Discussed potential malignant risk with radial scar. She is opted for right breast seed lumpectomy. Risk of lumpectomy include bleeding, infection, seroma, more surgery, use of seed/wire, wound care, cosmetic deformity and the need for  other treatments, death , blood clots, death. Pt agrees to proceed.  Current Plans Pt Education - [facility] Breast Biopsy HCI: discussed with patient and provided information. The anatomy and the physiology was discussed. The pathophysiology and natural history of the disease was discussed. Options were discussed and recommendations were made. Technique, risks, benefits, & alternatives were discussed. Risks  such as stroke, heart attack, bleeding, indection, death, and other risks discussed. Questions answered. The patient agrees to proceed.

## 2018-11-23 ENCOUNTER — Other Ambulatory Visit: Payer: Self-pay | Admitting: Surgery

## 2018-11-23 DIAGNOSIS — N6489 Other specified disorders of breast: Secondary | ICD-10-CM

## 2018-12-04 ENCOUNTER — Other Ambulatory Visit: Payer: Self-pay

## 2018-12-04 ENCOUNTER — Encounter (HOSPITAL_BASED_OUTPATIENT_CLINIC_OR_DEPARTMENT_OTHER): Payer: Self-pay | Admitting: *Deleted

## 2018-12-11 ENCOUNTER — Encounter (HOSPITAL_BASED_OUTPATIENT_CLINIC_OR_DEPARTMENT_OTHER)
Admission: RE | Admit: 2018-12-11 | Discharge: 2018-12-11 | Disposition: A | Discharge status: Home / Self Care | Payer: 59 | Source: Ambulatory Visit | Attending: Surgery | Admitting: Surgery

## 2018-12-11 ENCOUNTER — Other Ambulatory Visit: Payer: Self-pay

## 2018-12-11 ENCOUNTER — Other Ambulatory Visit (HOSPITAL_COMMUNITY)
Admission: RE | Admit: 2018-12-11 | Discharge: 2018-12-11 | Disposition: A | Discharge status: Home / Self Care | Payer: 59 | Source: Ambulatory Visit | Attending: Surgery | Admitting: Surgery

## 2018-12-11 DIAGNOSIS — Z01812 Encounter for preprocedural laboratory examination: Secondary | ICD-10-CM | POA: Diagnosis not present

## 2018-12-11 DIAGNOSIS — Z20828 Contact with and (suspected) exposure to other viral communicable diseases: Secondary | ICD-10-CM | POA: Diagnosis not present

## 2018-12-11 LAB — CBC WITH DIFFERENTIAL/PLATELET
Abs Immature Granulocytes: 0.02 10*3/uL (ref 0.00–0.07)
Basophils Absolute: 0 10*3/uL (ref 0.0–0.1)
Basophils Relative: 1 %
Eosinophils Absolute: 0.1 10*3/uL (ref 0.0–0.5)
Eosinophils Relative: 2 %
HCT: 37.8 % (ref 36.0–46.0)
Hemoglobin: 12.2 g/dL (ref 12.0–15.0)
Immature Granulocytes: 0 %
Lymphocytes Relative: 30 %
Lymphs Abs: 2.1 10*3/uL (ref 0.7–4.0)
MCH: 28 pg (ref 26.0–34.0)
MCHC: 32.3 g/dL (ref 30.0–36.0)
MCV: 86.9 fL (ref 80.0–100.0)
Monocytes Absolute: 0.7 10*3/uL (ref 0.1–1.0)
Monocytes Relative: 10 %
Neutro Abs: 3.9 10*3/uL (ref 1.7–7.7)
Neutrophils Relative %: 57 %
Platelets: 327 10*3/uL (ref 150–400)
RBC: 4.35 MIL/uL (ref 3.87–5.11)
RDW: 13.8 % (ref 11.5–15.5)
WBC: 6.9 10*3/uL (ref 4.0–10.5)
nRBC: 0 % (ref 0.0–0.2)

## 2018-12-11 LAB — COMPREHENSIVE METABOLIC PANEL
ALT: 13 U/L (ref 0–44)
AST: 15 U/L (ref 15–41)
Albumin: 3.7 g/dL (ref 3.5–5.0)
Alkaline Phosphatase: 72 U/L (ref 38–126)
Anion gap: 9 (ref 5–15)
BUN: 20 mg/dL (ref 6–20)
CO2: 24 mmol/L (ref 22–32)
Calcium: 9 mg/dL (ref 8.9–10.3)
Chloride: 109 mmol/L (ref 98–111)
Creatinine, Ser: 0.78 mg/dL (ref 0.44–1.00)
GFR calc Af Amer: >60 mL/min (ref >60)
GFR calc non Af Amer: >60 mL/min (ref >60)
Glucose, Bld: 105 mg/dL — ABNORMAL HIGH (ref 70–99)
Potassium: 3.7 mmol/L (ref 3.5–5.1)
Sodium: 142 mmol/L (ref 135–145)
Total Bilirubin: 0.4 mg/dL (ref 0.3–1.2)
Total Protein: 6.4 g/dL — ABNORMAL LOW (ref 6.5–8.1)

## 2018-12-11 LAB — SARS CORONAVIRUS 2 (TAT 6-24 HRS): SARS Coronavirus 2: NEGATIVE

## 2018-12-11 NOTE — Progress Notes (Signed)
EKG reviewed by Dr. Carignan and will proceed with surgery as scheduled.  

## 2018-12-11 NOTE — Progress Notes (Signed)

## 2018-12-12 ENCOUNTER — Ambulatory Visit
Admission: RE | Admit: 2018-12-12 | Discharge: 2018-12-12 | Disposition: A | Discharge status: Home / Self Care | Payer: 59 | Source: Ambulatory Visit | Attending: Surgery | Admitting: Surgery

## 2018-12-12 DIAGNOSIS — R928 Other abnormal and inconclusive findings on diagnostic imaging of breast: Secondary | ICD-10-CM | POA: Diagnosis not present

## 2018-12-12 DIAGNOSIS — N6489 Other specified disorders of breast: Secondary | ICD-10-CM

## 2018-12-13 ENCOUNTER — Encounter (HOSPITAL_BASED_OUTPATIENT_CLINIC_OR_DEPARTMENT_OTHER)
Admission: RE | Disposition: A | Discharge status: Home / Self Care | Payer: Self-pay | Source: Home / Self Care | Attending: Surgery

## 2018-12-13 ENCOUNTER — Ambulatory Visit (HOSPITAL_BASED_OUTPATIENT_CLINIC_OR_DEPARTMENT_OTHER)
Admission: RE | Admit: 2018-12-13 | Discharge: 2018-12-13 | Disposition: A | Discharge status: Home / Self Care | Payer: 59 | Attending: Surgery | Admitting: Surgery

## 2018-12-13 ENCOUNTER — Encounter (HOSPITAL_BASED_OUTPATIENT_CLINIC_OR_DEPARTMENT_OTHER): Payer: Self-pay

## 2018-12-13 ENCOUNTER — Ambulatory Visit
Admission: RE | Admit: 2018-12-13 | Discharge: 2018-12-13 | Disposition: A | Discharge status: Home / Self Care | Payer: 59 | Source: Ambulatory Visit | Attending: Surgery | Admitting: Surgery

## 2018-12-13 ENCOUNTER — Other Ambulatory Visit: Payer: Self-pay

## 2018-12-13 ENCOUNTER — Ambulatory Visit (HOSPITAL_BASED_OUTPATIENT_CLINIC_OR_DEPARTMENT_OTHER): Payer: 59 | Admitting: Certified Registered"

## 2018-12-13 DIAGNOSIS — E785 Hyperlipidemia, unspecified: Secondary | ICD-10-CM | POA: Diagnosis not present

## 2018-12-13 DIAGNOSIS — F419 Anxiety disorder, unspecified: Secondary | ICD-10-CM | POA: Diagnosis not present

## 2018-12-13 DIAGNOSIS — Z79899 Other long term (current) drug therapy: Secondary | ICD-10-CM | POA: Insufficient documentation

## 2018-12-13 DIAGNOSIS — N6011 Diffuse cystic mastopathy of right breast: Secondary | ICD-10-CM | POA: Diagnosis not present

## 2018-12-13 DIAGNOSIS — I10 Essential (primary) hypertension: Secondary | ICD-10-CM | POA: Insufficient documentation

## 2018-12-13 DIAGNOSIS — R928 Other abnormal and inconclusive findings on diagnostic imaging of breast: Secondary | ICD-10-CM | POA: Diagnosis not present

## 2018-12-13 DIAGNOSIS — N6489 Other specified disorders of breast: Secondary | ICD-10-CM | POA: Insufficient documentation

## 2018-12-13 DIAGNOSIS — E7800 Pure hypercholesterolemia, unspecified: Secondary | ICD-10-CM | POA: Insufficient documentation

## 2018-12-13 HISTORY — DX: Anxiety disorder, unspecified: F41.9

## 2018-12-13 HISTORY — PX: BREAST LUMPECTOMY WITH RADIOACTIVE SEED LOCALIZATION: SHX6424

## 2018-12-13 SURGERY — BREAST LUMPECTOMY WITH RADIOACTIVE SEED LOCALIZATION
Anesthesia: General | Site: Breast | Laterality: Right

## 2018-12-13 MED ORDER — FENTANYL CITRATE (PF) 100 MCG/2ML IJ SOLN
50.0000 ug | INTRAMUSCULAR | Status: DC | PRN
  Administered 2018-12-13: 100 ug via INTRAVENOUS

## 2018-12-13 MED ORDER — BUPIVACAINE-EPINEPHRINE (PF) 0.25% -1:200000 IJ SOLN
INTRAMUSCULAR | Status: DC | PRN
  Administered 2018-12-13: 20 mL

## 2018-12-13 MED ORDER — CELECOXIB 200 MG PO CAPS
200.0000 mg | ORAL_CAPSULE | ORAL | Status: AC
  Administered 2018-12-13: 08:00:00 200 mg via ORAL

## 2018-12-13 MED ORDER — DEXAMETHASONE SODIUM PHOSPHATE 10 MG/ML IJ SOLN
INTRAMUSCULAR | Status: DC | PRN
  Administered 2018-12-13 (×2): 10 mg via INTRAVENOUS

## 2018-12-13 MED ORDER — MIDAZOLAM HCL 2 MG/2ML IJ SOLN
INTRAMUSCULAR | Status: AC
  Filled 2018-12-13: qty 2

## 2018-12-13 MED ORDER — GABAPENTIN 300 MG PO CAPS
300.0000 mg | ORAL_CAPSULE | ORAL | Status: AC
  Administered 2018-12-13: 300 mg via ORAL

## 2018-12-13 MED ORDER — FENTANYL CITRATE (PF) 100 MCG/2ML IJ SOLN
INTRAMUSCULAR | Status: AC
  Filled 2018-12-13: qty 2

## 2018-12-13 MED ORDER — CEFAZOLIN SODIUM-DEXTROSE 2-4 GM/100ML-% IV SOLN
INTRAVENOUS | Status: AC
  Filled 2018-12-13: qty 100

## 2018-12-13 MED ORDER — CEFAZOLIN SODIUM-DEXTROSE 2-4 GM/100ML-% IV SOLN: 2.0000 g | INTRAVENOUS | Status: DC

## 2018-12-13 MED ORDER — ONDANSETRON HCL 4 MG/2ML IJ SOLN
INTRAMUSCULAR | Status: DC | PRN
  Administered 2018-12-13: 4 mg via INTRAVENOUS

## 2018-12-13 MED ORDER — CEFAZOLIN SODIUM-DEXTROSE 2-3 GM-%(50ML) IV SOLR
INTRAVENOUS | Status: DC | PRN
  Administered 2018-12-13: 2 g via INTRAVENOUS

## 2018-12-13 MED ORDER — LACTATED RINGERS IV SOLN
INTRAVENOUS | Status: DC
  Administered 2018-12-13: 10:00:00 via INTRAVENOUS

## 2018-12-13 MED ORDER — FENTANYL CITRATE (PF) 100 MCG/2ML IJ SOLN: 25.0000 ug | INTRAMUSCULAR | Status: DC | PRN

## 2018-12-13 MED ORDER — IBUPROFEN 800 MG PO TABS: 800.0000 mg | ORAL_TABLET | Freq: Three times a day (TID) | ORAL | 0 refills | Status: DC | PRN

## 2018-12-13 MED ORDER — EPHEDRINE SULFATE 50 MG/ML IJ SOLN
INTRAMUSCULAR | Status: DC | PRN
  Administered 2018-12-13: 15 mg via INTRAVENOUS

## 2018-12-13 MED ORDER — DEXAMETHASONE SODIUM PHOSPHATE 10 MG/ML IJ SOLN
INTRAMUSCULAR | Status: AC
  Filled 2018-12-13: qty 1

## 2018-12-13 MED ORDER — PROMETHAZINE HCL 25 MG/ML IJ SOLN: 6.2500 mg | INTRAMUSCULAR | Status: DC | PRN

## 2018-12-13 MED ORDER — ONDANSETRON HCL 4 MG/2ML IJ SOLN
INTRAMUSCULAR | Status: AC
  Filled 2018-12-13: qty 2

## 2018-12-13 MED ORDER — CELECOXIB 200 MG PO CAPS
ORAL_CAPSULE | ORAL | Status: AC
  Filled 2018-12-13: qty 1

## 2018-12-13 MED ORDER — PROPOFOL 10 MG/ML IV BOLUS
INTRAVENOUS | Status: DC | PRN
  Administered 2018-12-13: 200 mg via INTRAVENOUS

## 2018-12-13 MED ORDER — KETOROLAC TROMETHAMINE 30 MG/ML IJ SOLN: 30.0000 mg | Freq: Once | INTRAMUSCULAR | Status: DC | PRN

## 2018-12-13 MED ORDER — LIDOCAINE HCL (CARDIAC) PF 100 MG/5ML IV SOSY
PREFILLED_SYRINGE | INTRAVENOUS | Status: DC | PRN
  Administered 2018-12-13: 100 mg via INTRAVENOUS

## 2018-12-13 MED ORDER — MIDAZOLAM HCL 2 MG/2ML IJ SOLN
1.0000 mg | INTRAMUSCULAR | Status: DC | PRN
  Administered 2018-12-13: 10:00:00 2 mg via INTRAVENOUS

## 2018-12-13 MED ORDER — GABAPENTIN 300 MG PO CAPS
ORAL_CAPSULE | ORAL | Status: AC
  Filled 2018-12-13: qty 1

## 2018-12-13 MED ORDER — SCOPOLAMINE 1 MG/3DAYS TD PT72: 1.0000 | MEDICATED_PATCH | Freq: Once | TRANSDERMAL | Status: DC

## 2018-12-13 MED ORDER — PROPOFOL 10 MG/ML IV BOLUS
INTRAVENOUS | Status: AC
  Filled 2018-12-13: qty 20

## 2018-12-13 MED FILL — IBUPROFEN 800 MG TABS: 800 | 10 days supply | Qty: 30 | Fill #0

## 2018-12-13 SURGICAL SUPPLY — 47 items
APPLIER CLIP 9.375 MED OPEN (MISCELLANEOUS)
BINDER BREAST LRG (GAUZE/BANDAGES/DRESSINGS) ×3 IMPLANT
BINDER BREAST MEDIUM (GAUZE/BANDAGES/DRESSINGS) IMPLANT
BINDER BREAST XLRG (GAUZE/BANDAGES/DRESSINGS) IMPLANT
BINDER BREAST XXLRG (GAUZE/BANDAGES/DRESSINGS) IMPLANT
BLADE SURG 15 STRL LF DISP TIS (BLADE) ×1 IMPLANT
BLADE SURG 15 STRL SS (BLADE) ×2
CANISTER SUC SOCK COL 7IN (MISCELLANEOUS) IMPLANT
CANISTER SUCT 1200ML W/VALVE (MISCELLANEOUS) IMPLANT
CHLORAPREP W/TINT 26 (MISCELLANEOUS) ×3 IMPLANT
CLIP APPLIE 9.375 MED OPEN (MISCELLANEOUS) IMPLANT
COVER BACK TABLE REUSABLE LG (DRAPES) ×3 IMPLANT
COVER MAYO STAND REUSABLE (DRAPES) ×3 IMPLANT
COVER PROBE W GEL 5X96 (DRAPES) ×3 IMPLANT
COVER WAND RF STERILE (DRAPES) IMPLANT
DECANTER SPIKE VIAL GLASS SM (MISCELLANEOUS) IMPLANT
DERMABOND ADVANCED (GAUZE/BANDAGES/DRESSINGS) ×2
DERMABOND ADVANCED .7 DNX12 (GAUZE/BANDAGES/DRESSINGS) ×1 IMPLANT
DRAPE LAPAROSCOPIC ABDOMINAL (DRAPES) IMPLANT
DRAPE LAPAROTOMY 100X72 PEDS (DRAPES) ×3 IMPLANT
DRAPE UTILITY XL STRL (DRAPES) ×3 IMPLANT
ELECT COATED BLADE 2.86 ST (ELECTRODE) ×3 IMPLANT
ELECT REM PT RETURN 9FT ADLT (ELECTROSURGICAL) ×3
ELECTRODE REM PT RTRN 9FT ADLT (ELECTROSURGICAL) ×1 IMPLANT
GLOVE BIOGEL PI IND STRL 8 (GLOVE) ×3 IMPLANT
GLOVE BIOGEL PI INDICATOR 8 (GLOVE) ×6
GLOVE ECLIPSE 8.0 STRL XLNG CF (GLOVE) ×9 IMPLANT
GOWN STRL REUS W/ TWL LRG LVL3 (GOWN DISPOSABLE) ×2 IMPLANT
GOWN STRL REUS W/TWL LRG LVL3 (GOWN DISPOSABLE) ×4
HEMOSTAT ARISTA ABSORB 3G PWDR (HEMOSTASIS) IMPLANT
HEMOSTAT SNOW SURGICEL 2X4 (HEMOSTASIS) IMPLANT
KIT MARKER MARGIN INK (KITS) ×3 IMPLANT
NEEDLE HYPO 25X1 1.5 SAFETY (NEEDLE) ×3 IMPLANT
NS IRRIG 1000ML POUR BTL (IV SOLUTION) ×3 IMPLANT
PACK BASIN DAY SURGERY FS (CUSTOM PROCEDURE TRAY) ×3 IMPLANT
PENCIL BUTTON HOLSTER BLD 10FT (ELECTRODE) ×3 IMPLANT
SLEEVE SCD COMPRESS KNEE MED (MISCELLANEOUS) ×3 IMPLANT
SPONGE LAP 4X18 RFD (DISPOSABLE) ×3 IMPLANT
SUT MNCRL AB 4-0 PS2 18 (SUTURE) ×3 IMPLANT
SUT SILK 2 0 SH (SUTURE) IMPLANT
SUT VICRYL 3-0 CR8 SH (SUTURE) ×3 IMPLANT
SYR CONTROL 10ML LL (SYRINGE) ×3 IMPLANT
TOWEL GREEN STERILE FF (TOWEL DISPOSABLE) ×3 IMPLANT
TRAY FAXITRON CT DISP (TRAY / TRAY PROCEDURE) ×3 IMPLANT
TUBE CONNECTING 20'X1/4 (TUBING)
TUBE CONNECTING 20X1/4 (TUBING) IMPLANT
YANKAUER SUCT BULB TIP NO VENT (SUCTIONS) IMPLANT

## 2018-12-13 NOTE — Transfer of Care (Signed)
Immediate Anesthesia Transfer of Care Note  Patient: Alice Ryan [facility]  Procedure(s) Performed: RIGHT BREAST LUMPECTOMY WITH RADIOACTIVE SEED LOCALIZATION (Right Breast)  Patient Location: PACU  Anesthesia Type:General  Level of Consciousness: awake, alert  and oriented  Airway & Oxygen Therapy: Patient Spontanous Breathing and Patient connected to face mask oxygen  Post-op Assessment: Report given to RN and Post -op Vital signs reviewed and stable  Post vital signs: Reviewed and stable  Last Vitals:  Vitals Value Taken Time  BP    Temp    Pulse    Resp    SpO2      Last Pain:  Vitals:   12/13/18 0829  TempSrc: Oral  PainSc: 0-No pain         Complications: No apparent anesthesia complications

## 2018-12-13 NOTE — Anesthesia Procedure Notes (Signed)
Procedure Name: LMA Insertion Performed by: Jenavee Laguardia M, CRNA Pre-anesthesia Checklist: Patient identified, Emergency Drugs available, Suction available, Patient being monitored and Timeout performed Patient Re-evaluated:Patient Re-evaluated prior to induction Oxygen Delivery Method: Circle system utilized Preoxygenation: Pre-oxygenation with 100% oxygen Induction Type: IV induction LMA: LMA inserted LMA Size: 3.0 Number of attempts: 1 Placement Confirmation: positive ETCO2,  CO2 detector and breath sounds checked- equal and bilateral Tube secured with: Tape Dental Injury: Teeth and Oropharynx as per pre-operative assessment        

## 2018-12-13 NOTE — Discharge Instructions (Signed)
[facility] °Office Phone Number [phone] ° °BREAST BIOPSY/ PARTIAL MASTECTOMY: POST OP INSTRUCTIONS ° °Always review your discharge instruction sheet given to you by the facility where your surgery was performed. ° °IF YOU HAVE DISABILITY OR FAMILY LEAVE FORMS, YOU MUST BRING THEM TO THE OFFICE FOR PROCESSING.  DO NOT GIVE THEM TO YOUR DOCTOR. ° °1. A prescription for pain medication may be given to you upon discharge.  Take your pain medication as prescribed, if needed.  If narcotic pain medicine is not needed, then you may take acetaminophen (Tylenol) or ibuprofen (Advil) as needed. °2. Take your usually prescribed medications unless otherwise directed °3. If you need a refill on your pain medication, please contact your pharmacy.  They will contact our office to request authorization.  Prescriptions will not be filled after 5pm or on week-ends. °4. You should eat very light the first 24 hours after surgery, such as soup, crackers, pudding, etc.  Resume your normal diet the day after surgery. °5. Most patients will experience some swelling and bruising in the breast.  Ice packs and a good support bra will help.  Swelling and bruising can take several days to resolve.  °6. It is common to experience some constipation if taking pain medication after surgery.  Increasing fluid intake and taking a stool softener will usually help or prevent this problem from occurring.  A mild laxative (Milk of Magnesia or Miralax) should be taken according to package directions if there are no bowel movements after 48 hours. °7. Unless discharge instructions indicate otherwise, you may remove your bandages 24-48 hours after surgery, and you may shower at that time.  You may have steri-strips (small skin tapes) in place directly over the incision.  These strips should be left on the skin for 7-10 days.  If your surgeon used skin glue on the incision, you may shower in 24 hours.  The glue will flake off over the  next 2-3 weeks.  Any sutures or staples will be removed at the office during your follow-up visit. °8. ACTIVITIES:  You may resume regular daily activities (gradually increasing) beginning the next day.  Wearing a good support bra or sports bra minimizes pain and swelling.  You may have sexual intercourse when it is comfortable. °a. You may drive when you no longer are taking prescription pain medication, you can comfortably wear a seatbelt, and you can safely maneuver your car and apply brakes. °b. RETURN TO WORK:  ______________________________________________________________________________________ °9. You should see your doctor in the office for a follow-up appointment approximately two weeks after your surgery.  Your doctor’s nurse will typically make your follow-up appointment when she calls you with your pathology report.  Expect your pathology report 2-3 business days after your surgery.  You may call to check if you do not hear from us after three days. °10. OTHER INSTRUCTIONS: _______________________________________________________________________________________________ _____________________________________________________________________________________________________________________________________ °_____________________________________________________________________________________________________________________________________ °_____________________________________________________________________________________________________________________________________ ° °WHEN TO CALL YOUR DOCTOR: °1. Fever over 101.0 °2. Nausea and/or vomiting. °3. Extreme swelling or bruising. °4. Continued bleeding from incision. °5. Increased pain, redness, or drainage from the incision. ° °The clinic staff is available to answer your questions during regular business hours.  Please don’t hesitate to call and ask to speak to one of the nurses for clinical concerns.  If you have a medical emergency, go to the nearest  emergency room or call 911.  A surgeon from [facility] is always on call at the hospital. ° °For further questions, please visit [email or web address]  ° ° ° ° °  Post Anesthesia Home Care Instructions ° °Activity: °Get plenty of rest for the remainder of the day. A responsible individual must stay with you for 24 hours following the procedure.  °For the next 24 hours, DO NOT: °-Drive a car °-Operate machinery °-Drink alcoholic beverages °-Take any medication unless instructed by your physician °-Make any legal decisions or sign important papers. ° °Meals: °Start with liquid foods such as gelatin or soup. Progress to regular foods as tolerated. Avoid greasy, spicy, heavy foods. If nausea and/or vomiting occur, drink only clear liquids until the nausea and/or vomiting subsides. Call your physician if vomiting continues. ° °Special Instructions/Symptoms: °Your throat may feel dry or sore from the anesthesia or the breathing tube placed in your throat during surgery. If this causes discomfort, gargle with warm salt water. The discomfort should disappear within 24 hours. ° °If you had a scopolamine patch placed behind your ear for the management of post- operative nausea and/or vomiting: ° °1. The medication in the patch is effective for 72 hours, after which it should be removed.  Wrap patch in a tissue and discard in the trash. Wash hands thoroughly with soap and water. °2. You may remove the patch earlier than 72 hours if you experience unpleasant side effects which may include dry mouth, dizziness or visual disturbances. °3. Avoid touching the patch. Wash your hands with soap and water after contact with the patch. °  ° °

## 2018-12-13 NOTE — Anesthesia Preprocedure Evaluation (Signed)
Anesthesia Evaluation  Patient identified by MRN, date of birth, ID band Patient awake    Reviewed: Allergy & Precautions, NPO status , Patient's Chart, lab work & pertinent test results  Airway Mallampati: II  TM Distance: >3 FB Neck ROM: Full    Dental no notable dental hx.    Pulmonary neg pulmonary ROS,    Pulmonary exam normal breath sounds clear to auscultation       Cardiovascular hypertension, Normal cardiovascular exam Rhythm:Regular Rate:Normal     Neuro/Psych negative neurological ROS  negative psych ROS   GI/Hepatic Neg liver ROS, GERD  ,  Endo/Other  negative endocrine ROS  Renal/GU negative Renal ROS  negative genitourinary   Musculoskeletal negative musculoskeletal ROS (+)   Abdominal   Peds negative pediatric ROS (+)  Hematology negative hematology ROS (+)   Anesthesia Other Findings   Reproductive/Obstetrics negative OB ROS                             Anesthesia Physical Anesthesia Plan  ASA: II  Anesthesia Plan: General   Post-op Pain Management:    Induction: Intravenous  PONV Risk Score and Plan: 3 and Ondansetron, Dexamethasone and Treatment may vary due to age or medical condition  Airway Management Planned: LMA  Additional Equipment:   Intra-op Plan:   Post-operative Plan: Extubation in OR  Informed Consent: I have reviewed the patients History and Physical, chart, labs and discussed the procedure including the risks, benefits and alternatives for the proposed anesthesia with the patient or authorized representative who has indicated his/her understanding and acceptance.   Dental advisory given  Plan Discussed with: CRNA and Surgeon  Anesthesia Plan Comments:         Anesthesia Quick Evaluation  

## 2018-12-13 NOTE — Op Note (Signed)
Preoperative diagnosis: Right breast radial scar  Postoperative diagnosis: Same  Procedure: Right breast seed localized lumpectomy  Surgeon Erroll Luna, MD  Anesthesia: LMA with 0.25% Sensorcaine local with epinephrine  EBL: Minimal  Specimen: Right breast tissue with seed and clip verified by Faxitron  Drains: None  Indications for procedure: The patient is a 58 year old female who upon undergoing screening mammography was found to have a density in the right breast at about 6:00.  Core biopsy showed this to be a complex sclerosing lesion/radial scar.  We discussed the significance of these findings.  We discussed potential upgrade risk to malignancy of approximately 10% with these lesions.  We discussed observation as well.  She opted for right breast lumpectomy after discussion of the above.The procedure has been discussed with the patient. Alternatives to surgery have been discussed with the patient.  Risks of surgery include bleeding,  Infection,  Seroma formation, death,  and the need for further surgery.   The patient understands and wishes to proceed.  Description of procedure: The patient was met in the holding area and questions were answered.  Neoprobe used to verify seed location and activity.  The right side was marked as correct.  Questions were answered.  She was taken back to the operative room.  She is placed supine upon the OR table.  After induction of general esthesia, the right breast was prepped draped sterile fashion timeout was performed.  Neoprobe was used to verify seed location at about 6:00 and a curvilinear incision was made along the inferior border of the nipple areolar complex.  Dissection was carried down and all tissue around the seed and clip were excised with grossly negative margins.  Faxitron revealed the seed clip to be present and this was sent to pathology.  The wound was made hemostatic with cautery irrigated and closed with 3-0 Vicryl and 4-0 Monocryl.   Dermabond applied.  All final counts were found to be correct.  The patient was awoke extubated and taken to recovery in satisfactory condition.

## 2018-12-13 NOTE — Interval H&P Note (Signed)
History and Physical Interval Note:  12/13/2018 9:27 AM  Alice Ryan  has presented today for surgery, with the diagnosis of [place].  The various methods of treatment have been discussed with the patient and family. After consideration of risks, benefits and other options for treatment, the patient has consented to  Procedure(s): RIGHT BREAST LUMPECTOMY WITH RADIOACTIVE SEED LOCALIZATION (Right) as a surgical intervention.  The patient's history has been reviewed, patient examined, no change in status, stable for surgery.  I have reviewed the patient's chart and labs.  Questions were answered to the patient's satisfaction.     [place]

## 2018-12-13 NOTE — Anesthesia Postprocedure Evaluation (Signed)
Anesthesia Post Note  Patient: Alice Ryan  Procedure(s) Performed: RIGHT BREAST LUMPECTOMY WITH RADIOACTIVE SEED LOCALIZATION (Right Breast)     Patient location during evaluation: PACU Anesthesia Type: General Level of consciousness: awake and alert Pain management: pain level controlled Vital Signs Assessment: post-procedure vital signs reviewed and stable Respiratory status: spontaneous breathing, nonlabored ventilation, respiratory function stable and patient connected to nasal cannula oxygen Cardiovascular status: blood pressure returned to baseline and stable Postop Assessment: no apparent nausea or vomiting Anesthetic complications: no    Last Vitals:  Vitals:   12/13/18 1045 12/13/18 1100  BP: 127/69 122/70  Pulse: 80 78  Resp: 13 17  Temp:    SpO2: 100% 100%    Last Pain:  Vitals:   12/13/18 1045  TempSrc:   PainSc: Asleep                 Temesgen Weightman S

## 2018-12-14 ENCOUNTER — Encounter (HOSPITAL_BASED_OUTPATIENT_CLINIC_OR_DEPARTMENT_OTHER): Payer: Self-pay | Admitting: Surgery

## 2018-12-25 DIAGNOSIS — M1711 Unilateral primary osteoarthritis, right knee: Secondary | ICD-10-CM | POA: Diagnosis not present

## 2019-01-08 ENCOUNTER — Other Ambulatory Visit: Payer: Self-pay

## 2019-01-08 ENCOUNTER — Encounter: Payer: Self-pay | Admitting: Family Medicine

## 2019-01-08 ENCOUNTER — Ambulatory Visit: Payer: 59 | Admitting: Family Medicine

## 2019-01-08 VITALS — BP 160/80 | HR 66 | Temp 98.1°F | Resp 12 | Ht 61.0 in | Wt 139.5 lb

## 2019-01-08 DIAGNOSIS — I10 Essential (primary) hypertension: Secondary | ICD-10-CM

## 2019-01-08 DIAGNOSIS — Z23 Encounter for immunization: Secondary | ICD-10-CM

## 2019-01-08 DIAGNOSIS — M1711 Unilateral primary osteoarthritis, right knee: Secondary | ICD-10-CM | POA: Diagnosis not present

## 2019-01-08 DIAGNOSIS — F419 Anxiety disorder, unspecified: Secondary | ICD-10-CM | POA: Diagnosis not present

## 2019-01-08 MED ORDER — DICLOFENAC SODIUM 1 % TD GEL: 4.0000 g | Freq: Four times a day (QID) | TRANSDERMAL | 1 refills | Status: DC

## 2019-01-08 MED ORDER — LOSARTAN POTASSIUM-HCTZ 50-12.5 MG PO TABS: 1.0000 | ORAL_TABLET | Freq: Every day | ORAL | 2 refills | Status: DC

## 2019-01-08 MED FILL — DICLOFENAC SODIUM 1% GEL: 1 | 6 days supply | Qty: 100 | Fill #0

## 2019-01-08 MED FILL — LOSARTAN-HCTZ 50-12.5 MG TA: 50-12.5 | 30 days supply | Qty: 30 | Fill #0

## 2019-01-08 NOTE — Patient Instructions (Signed)
A few things to remember from today's visit:   Essential hypertension - Plan: losartan-hydrochlorothiazide (HYZAAR) 50-12.5 MG tablet  Anxiety disorder, unspecified type  Osteoarthritis of right knee, unspecified osteoarthritis type - Plan: diclofenac sodium (VOLTAREN) 1 % GEL  BP medication changed. Continue monitoring blood pressure at home.  You may benefit from hyaluronic acid injections, discussed this with your orthopedist.  Please be sure medication list is accurate. If a new problem present, please set up appointment sooner than planned today.

## 2019-01-08 NOTE — Progress Notes (Signed)
HPI:   Alice Ryan is a 58 y.o. female, who is here today to follow on recent OV and to address other concerns.  She was last seen on 10/03/2018, when she was started on sertraline 50 mg daily. She was supposed to have a 6 weeks follow-up. She has tolerated medication well, no side effects noted.  She feels like medication has helped some with dealing with the stress. Her husband was recently diagnosed with a stage IV cancer, currently he is undergoing chemotherapy.  She denies depressed mood or suicidal thoughts.  Hypertension: BP elevated today. Yesterday SBP was 160. Currently she is on HCTZ 25 mg daily, she has not been compliant with medication, she is taking it about 4-5 times per week.  Denies severe/frequent headache, visual changes, chest pain, dyspnea, palpitation, claudication, focal weakness, or edema.  Lab Results  Component Value Date   CREATININE 0.78 12/11/2018   BUN 20 12/11/2018   NA 142 12/11/2018   K 3.7 12/11/2018   CL 109 12/11/2018   CO2 24 12/11/2018    Right knee pain, she is following with orthopedist. Recently she received a intra-articular steroid injection, which helped for a week. Pain is exacerbated by prolonged standing, walking, and by movement after prolonged sitting. + Stiffness. She has noted mild effusion but no erythema. No history of trauma.  It is interfering with some activities of daily living.  Review of Systems  Constitutional: Positive for fatigue. Negative for activity change, appetite change and fever.  HENT: Negative for mouth sores, nosebleeds and trouble swallowing.   Eyes: Negative for pain and redness.  Respiratory: Negative for cough and wheezing.   Gastrointestinal: Negative for abdominal pain, nausea and vomiting.       Negative for changes in bowel habits.  Genitourinary: Negative for decreased urine volume and hematuria.  Musculoskeletal: Positive for gait problem.  Skin: Negative for rash  and wound.  Neurological: Negative for syncope and facial asymmetry.  Rest see pertinent positives and negatives per HPI.   Current Outpatient Medications on File Prior to Visit  Medication Sig Dispense Refill  . atorvastatin (LIPITOR) 20 MG tablet TAKE 1 TABLET (20 MG TOTAL) BY MOUTH DAILY. 90 tablet 0  . mometasone (NASONEX) 50 MCG/ACT nasal spray PLACE 2 SPRAYS INTO THE NOSE DAILY. 17 g 0  . Multiple Vitamin (MULTIVITAMIN WITH MINERALS) TABS tablet Take 1 tablet by mouth daily.    . valACYclovir (VALTREX) 1000 MG tablet TAKE 2 TABLETS BY MOUTH TWICE DAILY FOR 1 DAY. TAKE AT FIST SIGNS OF OUTBREAK. 30 tablet 1   No current facility-administered medications on file prior to visit.      Past Medical History:  Diagnosis Date  . Acid reflux   . Anxiety   . Blood in stool   . GERD (gastroesophageal reflux disease)   . Hypercholesteremia   . Hyperlipidemia   . Hypertension   . Hypertension   . Kidney stones    Allergies  Allergen Reactions  . Apple Anaphylaxis  . Latex Anaphylaxis    Latex condom  . Chlorhexidine Gluconate Itching  . Flounder [Fish Allergy] Swelling  . Other Swelling  . Oxycodone-Acetaminophen Other (See Comments)    "knocked me out" Other reaction(s): Other (See Comments) "knocked me out"  . Peanut Butter Flavor Itching    Social History   Socioeconomic History  . Marital status: Married    Spouse name: Not on file  . Number of children: Not on file  .  Years of education: Not on file  . Highest education level: Not on file  Occupational History  . Not on file  Social Needs  . Financial resource strain: Not on file  . Food insecurity    Worry: Not on file    Inability: Not on file  . Transportation needs    Medical: Not on file    Non-medical: Not on file  Tobacco Use  . Smoking status: Never Smoker  . Smokeless tobacco: Never Used  Substance and Sexual Activity  . Alcohol use: Yes    Comment: social  . Drug use: No  . Sexual activity:  Not on file  Lifestyle  . Physical activity    Days per week: Not on file    Minutes per session: Not on file  . Stress: Not on file  Relationships  . Social Musician on phone: Not on file    Gets together: Not on file    Attends religious service: Not on file    Active member of club or organization: Not on file    Attends meetings of clubs or organizations: Not on file    Relationship status: Not on file  Other Topics Concern  . Not on file  Social History Narrative  . Not on file    Vitals:   01/08/19 1052  BP: (!) 160/80  Pulse: 66  Resp: 12  Temp: 98.1 F (36.7 C)  SpO2: 97%   Body mass index is 26.36 kg/m.   Physical Exam  Nursing note and vitals reviewed. Constitutional: She is oriented to person, place, and time. She appears well-developed and well-nourished. No distress.  HENT:  Head: Normocephalic and atraumatic.  Mouth/Throat: Oropharynx is clear and moist and mucous membranes are normal.  Eyes: Pupils are equal, round, and reactive to light. Conjunctivae are normal.  Cardiovascular: Normal rate and regular rhythm.  No murmur heard. Pulses:      Posterior tibial pulses are 2+ on the right side and 2+ on the left side.  Respiratory: Effort normal and breath sounds normal. No respiratory distress.  GI: Soft. She exhibits no mass. There is no hepatomegaly. There is no abdominal tenderness.  Musculoskeletal:        General: No edema.     Right knee: She exhibits effusion. She exhibits normal range of motion, no deformity and no erythema. Tenderness found.  Lymphadenopathy:    She has no cervical adenopathy.  Neurological: She is alert and oriented to person, place, and time. She has normal strength. No cranial nerve deficit. Gait normal.  Skin: Skin is warm. No rash noted. No erythema.  Psychiatric: She has a normal mood and affect.  Well groomed, good eye contact.    ASSESSMENT AND PLAN:  Ms. Alice Ryan was seen today for knee pain.   Diagnoses and all orders for this visit:  Orders Placed This Encounter  Procedures  . Flu Vaccine QUAD 36+ mos IM    Essential hypertension Poorly controlled. Possible complications of elevated BP discussed. Losartan added, she was on a ACEI before,so I do not think BMP is needed. Continue monitoring BP at home. Low sat diet. Instructed about warning signs.  -     losartan-hydrochlorothiazide (HYZAAR) 50-12.5 MG tablet; Take 1 tablet by mouth daily.  Anxiety disorder, unspecified type Improved. No changes in current management. CBT may help,recommended. Instructed about warning signs.  -     sertraline (ZOLOFT) 50 MG tablet; Take 1 tablet (50 mg total) by mouth  daily.  Osteoarthritis of right knee, unspecified osteoarthritis type Because elevated BP I do not recommend oral NSAID's. Voltaren topical. We may consider Tramadol if pain is still limiting daily activities. Continue following with ortho,she may benefit from Hyaluronic intra articular injections.  -     diclofenac sodium (VOLTAREN) 1 % GEL; Apply 4 g topically 4 (four) times daily.  Need for influenza vaccination -     Flu Vaccine QUAD 36+ mos IM     Return in about 2 months (around 03/10/2019) for HTN.    Alice G. Martinique, MD  [facility] [place] office.

## 2019-01-12 ENCOUNTER — Encounter: Payer: Self-pay | Admitting: Family Medicine

## 2019-01-12 MED ORDER — SERTRALINE HCL 50 MG PO TABS: 50.0000 mg | ORAL_TABLET | Freq: Every day | ORAL | 2 refills | Status: DC

## 2019-01-14 MED FILL — SERTRALINE HCL 50 MG TABLET: 50 | 90 days supply | Qty: 90 | Fill #0

## 2019-03-11 ENCOUNTER — Encounter: Payer: Self-pay | Admitting: Family Medicine

## 2019-03-11 ENCOUNTER — Ambulatory Visit (INDEPENDENT_AMBULATORY_CARE_PROVIDER_SITE_OTHER): Payer: 59 | Admitting: Family Medicine

## 2019-03-11 DIAGNOSIS — G44319 Acute post-traumatic headache, not intractable: Secondary | ICD-10-CM | POA: Diagnosis not present

## 2019-03-11 DIAGNOSIS — R110 Nausea: Secondary | ICD-10-CM | POA: Diagnosis not present

## 2019-03-11 DIAGNOSIS — M549 Dorsalgia, unspecified: Secondary | ICD-10-CM

## 2019-03-11 MED ORDER — TIZANIDINE HCL 4 MG PO TABS: 4.0000 mg | ORAL_TABLET | Freq: Three times a day (TID) | ORAL | 0 refills | Status: AC | PRN

## 2019-03-11 MED ORDER — ONDANSETRON HCL 4 MG PO TABS: 4.0000 mg | ORAL_TABLET | Freq: Two times a day (BID) | ORAL | 0 refills | Status: AC | PRN

## 2019-03-11 MED FILL — ONDANSETRON HCL 4 MG TABLET: 4 | 5 days supply | Qty: 10 | Fill #0

## 2019-03-11 MED FILL — tiZANidine HCL 4 MG TABS: 4 | 10 days supply | Qty: 30 | Fill #0
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
## 2019-03-18 ENCOUNTER — Telehealth: Payer: Self-pay | Admitting: *Deleted

## 2019-03-18 NOTE — Telephone Encounter (Signed)
Please advise 
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
## 2019-03-22 ENCOUNTER — Other Ambulatory Visit: Payer: Self-pay | Admitting: Family Medicine

## 2019-03-22 DIAGNOSIS — M549 Dorsalgia, unspecified: Secondary | ICD-10-CM

## 2019-03-22 NOTE — Telephone Encounter (Signed)
Lmovm for pt to call the office back to schedule an x-ray appt.

## 2019-03-22 NOTE — Telephone Encounter (Signed)
Orders placed. She needs an X ray appt. Thanks, BJ

## 2019-03-26 NOTE — Telephone Encounter (Signed)
We currently do not have x-ray available, will try calling pt again & setting up appt once x-ray is back open.

## 2019-04-01 NOTE — Telephone Encounter (Signed)
Can we get pt scheduled for an x-ray appt? Can be at [facility] or [facility] if pt prefers not to wait. I've already left her a voicemail to call the office back.

## 2019-04-04 ENCOUNTER — Other Ambulatory Visit: Payer: Self-pay | Admitting: Family Medicine

## 2019-04-04 MED FILL — LOSARTAN-HCTZ 50-12.5 MG TA: 50-12.5 | 30 days supply | Qty: 30 | Fill #1

## 2019-04-08 MED FILL — ATORVASTATIN 20 MG TABLET: 20 | 90 days supply | Qty: 90 | Fill #0

## 2019-05-01 ENCOUNTER — Encounter: Payer: Self-pay | Admitting: Family Medicine

## 2019-05-01 ENCOUNTER — Telehealth (INDEPENDENT_AMBULATORY_CARE_PROVIDER_SITE_OTHER): Payer: 59 | Admitting: Family Medicine

## 2019-05-01 VITALS — Ht 61.0 in

## 2019-05-01 DIAGNOSIS — J309 Allergic rhinitis, unspecified: Secondary | ICD-10-CM

## 2019-05-01 DIAGNOSIS — H9202 Otalgia, left ear: Secondary | ICD-10-CM

## 2019-05-01 NOTE — Progress Notes (Signed)
Virtual Visit via Video Note   I connected with Alice Ryan on 05/01/19 by a video enabled telemedicine application and verified that I am speaking with the correct person using two identifiers.  Location patient: home Location provider:work office Persons participating in the virtual visit: patient, provider  I discussed the limitations of evaluation and management by telemedicine and the availability of in person appointments. The patient expressed understanding and agreed to proceed.  Chief Complaint  Patient presents with  . Sore Throat  . Ear Pain    HPI: Alice Ryan is a 59 yo female with hx of allergies,GERD,and HTN c/o left-sided sore throat and left earache that started 2 days ago. Sore throat exacerbated by swallowing, sharp pain. Not sure about exacerbating factors for earache.  Negative for hearing changes, ear drainage, stridor, or dysphagia.  Mild rhinorrhea and postnasal drainage. Negative for fever, chills, unusual fatigue, body aches, cough, CP, SOB, abdominal pain, diarrhea, N/V, or a skin rash. Negative for sick contacts or recent travel. No changes in the smell or taste.  When she woke up today noted that all symptoms have resolved. No OTC treatment. + Eye pruritus. Currently she is on Nasonex intranasal spray.  She got her COVID-19 vaccine about 2 weeks ago.  ROS: See pertinent positives and negatives per HPI.  Past Medical History:  Diagnosis Date  . Acid reflux   . Anxiety   . Blood in stool   . GERD (gastroesophageal reflux disease)   . Hypercholesteremia   . Hyperlipidemia   . Hypertension   . Hypertension   . Kidney stones     Past Surgical History:  Procedure Laterality Date  . BREAST EXCISIONAL BIOPSY Left 2011   benign  . BREAST LUMPECTOMY WITH RADIOACTIVE SEED LOCALIZATION Right 12/13/2018   Procedure: RIGHT BREAST LUMPECTOMY WITH RADIOACTIVE SEED LOCALIZATION;  Surgeon: Harriette Bouillon, MD;  Location: [facility];   Service: General;  Laterality: Right;  . CYST EXCISION PERINEAL N/A 06/13/2016   Procedure: CYST EXCISION PERIANAL, CHRONIC INFECTED;  Surgeon: Jimmye Norman, MD;  Location: [facility];  Service: General;  Laterality: N/A;  . EVALUATION UNDER ANESTHESIA WITH HEMORRHOIDECTOMY N/A 06/13/2016   Procedure: EXAM UNDER ANESTHESIA,  HEMORRHOIDECTOMY;  Surgeon: Jimmye Norman, MD;  Location: [facility];  Service: General;  Laterality: N/A;  . lumpectomy right breast  2011   benign  . TRIGGER FINGER RELEASE Right 07/31/2017   Procedure: RELEASE TRIGGER FINGER/A-1 PULLEY RIGHT RING FINGER;  Surgeon: Betha Loa, MD;  [facility];  Service: Orthopedics;  Laterality: Right;    Family History  Problem Relation Age of Onset  . Cancer Mother        breast cancer at age 34  . Stroke Mother   . Hypertension Mother   . Breast cancer Mother 48  . Hypertension Father     Social History   Socioeconomic History  . Marital status: Married    Spouse name: Not on file  . Number of children: Not on file  . Years of education: Not on file  . Highest education level: Not on file  Occupational History  . Not on file  Tobacco Use  . Smoking status: Never Smoker  . Smokeless tobacco: Never Used  Substance and Sexual Activity  . Alcohol use: Yes    Comment: social  . Drug use: No  . Sexual activity: Not on file  Other Topics Concern  . Not on file  Social History Narrative  .  Not on file   Social Determinants of Health   Financial Resource Strain:   . Difficulty of Paying Living Expenses: Not on file  Food Insecurity:   . Worried About Charity fundraiser in the Last Year: Not on file  . Ran Out of Food in the Last Year: Not on file  Transportation Needs:   . Lack of Transportation (Medical): Not on file  . Lack of Transportation (Non-Medical): Not on file  Physical Activity:   . Days of Exercise per Week: Not on file  . Minutes of Exercise per  Session: Not on file  Stress:   . Feeling of Stress : Not on file  Social Connections:   . Frequency of Communication with Friends and Family: Not on file  . Frequency of Social Gatherings with Friends and Family: Not on file  . Attends Religious Services: Not on file  . Active Member of Clubs or Organizations: Not on file  . Attends Archivist Meetings: Not on file  . Marital Status: Not on file  Intimate Partner Violence:   . Fear of Current or Ex-Partner: Not on file  . Emotionally Abused: Not on file  . Physically Abused: Not on file  . Sexually Abused: Not on file      Current Outpatient Medications:  .  atorvastatin (LIPITOR) 20 MG tablet, TAKE 1 TABLET (20 MG TOTAL) BY MOUTH DAILY., Disp: 90 tablet, Rfl: 1 .  diclofenac sodium (VOLTAREN) 1 % GEL, Apply 4 g topically 4 (four) times daily., Disp: 150 g, Rfl: 1 .  losartan-hydrochlorothiazide (HYZAAR) 50-12.5 MG tablet, Take 1 tablet by mouth daily., Disp: 30 tablet, Rfl: 2 .  mometasone (NASONEX) 50 MCG/ACT nasal spray, PLACE 2 SPRAYS INTO THE NOSE DAILY., Disp: 17 g, Rfl: 0 .  Multiple Vitamin (MULTIVITAMIN WITH MINERALS) TABS tablet, Take 1 tablet by mouth daily., Disp: , Rfl:  .  sertraline (ZOLOFT) 50 MG tablet, Take 1 tablet (50 mg total) by mouth daily., Disp: 90 tablet, Rfl: 2 .  valACYclovir (VALTREX) 1000 MG tablet, TAKE 2 TABLETS BY MOUTH TWICE DAILY FOR 1 DAY. TAKE AT FIST SIGNS OF OUTBREAK., Disp: 30 tablet, Rfl: 1  EXAM:  VITALS per patient if applicable:Ht 5\' 1"  (1.549 m)   LMP 09/02/2013   BMI 26.36 kg/m   GENERAL: alert, oriented, appears well and in no acute distress  HEENT: atraumatic, conjunctiva clear, no obvious abnormalities on inspection of external nose and ears  NECK: normal movements of the head and neck  LUNGS: on inspection no signs of respiratory distress, breathing rate appears normal, no obvious gross SOB, gasping or wheezing  CV: no obvious cyanosis  PSYCH/NEURO: pleasant and  cooperative, no obvious depression or anxiety, speech and thought processing grossly intact  ASSESSMENT AND PLAN:  Discussed the following assessment and plan:  Earache, left Ear ache and sore throat have resolved. We discussed possible etiologies,?  Eustachian tube dysfunction. Auto Inflation maneuvers recommended if ear ache is recurrent. Monitor for fever.  Allergic rhinitis, unspecified seasonality, unspecified trigger This problem could have been a contributing factor for above described symptoms. Continue Nasonex nasal spray daily as needed.    I discussed the assessment and treatment plan with the patient. Alice Ryan was provided an opportunity to ask questions and all were answered. She agreed with the plan and demonstrated an understanding of the instructions.    Return if symptoms worsen or fail to improve.    Betty Martinique, MD

## 2019-06-10 DIAGNOSIS — M25561 Pain in right knee: Secondary | ICD-10-CM | POA: Diagnosis not present

## 2019-06-17 DIAGNOSIS — S83241A Other tear of medial meniscus, current injury, right knee, initial encounter: Secondary | ICD-10-CM | POA: Diagnosis not present

## 2019-06-17 DIAGNOSIS — S83281A Other tear of lateral meniscus, current injury, right knee, initial encounter: Secondary | ICD-10-CM | POA: Diagnosis not present

## 2019-06-19 ENCOUNTER — Other Ambulatory Visit: Payer: Self-pay

## 2019-06-19 ENCOUNTER — Encounter (HOSPITAL_BASED_OUTPATIENT_CLINIC_OR_DEPARTMENT_OTHER): Payer: Self-pay | Admitting: Orthopedic Surgery

## 2019-06-21 ENCOUNTER — Encounter (HOSPITAL_BASED_OUTPATIENT_CLINIC_OR_DEPARTMENT_OTHER): Payer: Self-pay | Admitting: Orthopedic Surgery

## 2019-06-21 ENCOUNTER — Other Ambulatory Visit (HOSPITAL_COMMUNITY)
Admission: RE | Admit: 2019-06-21 | Discharge: 2019-06-21 | Disposition: A | Discharge status: Home / Self Care | Payer: 59 | Source: Ambulatory Visit | Attending: Orthopedic Surgery | Admitting: Orthopedic Surgery

## 2019-06-21 ENCOUNTER — Encounter (HOSPITAL_BASED_OUTPATIENT_CLINIC_OR_DEPARTMENT_OTHER)
Admission: RE | Admit: 2019-06-21 | Discharge: 2019-06-21 | Disposition: A | Discharge status: Home / Self Care | Payer: 59 | Source: Ambulatory Visit | Attending: Orthopedic Surgery | Admitting: Orthopedic Surgery

## 2019-06-21 DIAGNOSIS — Z20822 Contact with and (suspected) exposure to covid-19: Secondary | ICD-10-CM | POA: Insufficient documentation

## 2019-06-21 DIAGNOSIS — S83241A Other tear of medial meniscus, current injury, right knee, initial encounter: Secondary | ICD-10-CM | POA: Diagnosis present

## 2019-06-21 DIAGNOSIS — Z01812 Encounter for preprocedural laboratory examination: Secondary | ICD-10-CM | POA: Insufficient documentation

## 2019-06-21 LAB — BASIC METABOLIC PANEL
Anion gap: 11 (ref 5–15)
BUN: 7 mg/dL (ref 6–20)
CO2: 25 mmol/L (ref 22–32)
Calcium: 9.5 mg/dL (ref 8.9–10.3)
Chloride: 105 mmol/L (ref 98–111)
Creatinine, Ser: 0.87 mg/dL (ref 0.44–1.00)
GFR calc Af Amer: >60 mL/min (ref >60)
GFR calc non Af Amer: >60 mL/min (ref >60)
Glucose, Bld: 97 mg/dL (ref 70–99)
Potassium: 3.6 mmol/L (ref 3.5–5.1)
Sodium: 141 mmol/L (ref 135–145)

## 2019-06-21 LAB — SARS CORONAVIRUS 2 (TAT 6-24 HRS): SARS Coronavirus 2: NEGATIVE

## 2019-06-21 NOTE — H&P (Signed)
Alice Ryan is an 59 y.o. female.   Chief Complaint: right knee medial meniscus tear HPI: Alice Ryan is a 59 year-old seen for evaluation at the request of Dr. Farris Has for significant right knee pain that has been bothering her for the past 6-8 months, increasing in nature.  Pain with weight bearing and activity, relieved by rest.  She underwent an MRI on June 10, 2019 that has revealed a medial meniscus tear with chondromalacia.  She continues to have pain.    Past Medical History:  Diagnosis Date  . Acid reflux   . Acute lateral meniscus tear of right knee   . Anxiety   . Blood in stool   . GERD (gastroesophageal reflux disease)   . History of kidney stones   . Hypercholesteremia   . Hyperlipidemia   . Hypertension   . Hypertension     Past Surgical History:  Procedure Laterality Date  . BREAST EXCISIONAL BIOPSY Left 2011   benign  . BREAST LUMPECTOMY WITH RADIOACTIVE SEED LOCALIZATION Right 12/13/2018   Procedure: RIGHT BREAST LUMPECTOMY WITH RADIOACTIVE SEED LOCALIZATION;  Surgeon: Harriette Bouillon, MD;  Location: [facility];  Service: General;  Laterality: Right;  . CYST EXCISION PERINEAL N/A 06/13/2016   Procedure: CYST EXCISION PERIANAL, CHRONIC INFECTED;  Surgeon: Jimmye Norman, MD;  Location: [facility];  Service: General;  Laterality: N/A;  . EVALUATION UNDER ANESTHESIA WITH HEMORRHOIDECTOMY N/A 06/13/2016   Procedure: EXAM UNDER ANESTHESIA,  HEMORRHOIDECTOMY;  Surgeon: Jimmye Norman, MD;  Location: [facility];  Service: General;  Laterality: N/A;  . lumpectomy right breast  2011   benign  . TRIGGER FINGER RELEASE Right 07/31/2017   Procedure: RELEASE TRIGGER FINGER/A-1 PULLEY RIGHT RING FINGER;  Surgeon: Betha Loa, MD;  Location: [facility];  Service: Orthopedics;  Laterality: Right;    Family History  Problem Relation Age of Onset  . Cancer Mother        breast cancer at age 26  . Stroke Mother    . Hypertension Mother   . Breast cancer Mother 64  . Hypertension Father    Social History:  reports that she has never smoked. She has never used smokeless tobacco. She reports current alcohol use. She reports that she does not use drugs.  Allergies:  Allergies  Allergen Reactions  . Apple Anaphylaxis  . Latex Anaphylaxis    Latex condom  . Chlorhexidine Gluconate Itching  . Flounder [Fish Allergy] Swelling  . Other Swelling  . Oxycodone-Acetaminophen Other (See Comments)    "knocked me out" Other reaction(s): Other (See Comments) "knocked me out"  . Peanut Butter Flavor Itching    No medications prior to admission.    No results found for this or any previous visit (from the past 48 hour(s)). No results found.  Review of Systems  Constitutional: Negative.   HENT: Negative.   Eyes: Negative.   Respiratory: Negative.   Cardiovascular: Negative.   Endocrine: Negative.   Genitourinary: Negative.   Musculoskeletal: Positive for arthralgias, gait problem and joint swelling.  Skin: Negative.   Allergic/Immunologic: Negative.   Hematological: Negative.     Height 5\' 1"  (1.549 m), weight 62.6 kg, last menstrual period 09/02/2013. Physical Exam  Constitutional: She appears well-developed and well-nourished.  HENT:  Head: Normocephalic and atraumatic.  Eyes: Pupils are equal, round, and reactive to light. EOM are normal.  Cardiovascular: Normal rate.  Respiratory: Effort normal.  GI: Soft.  Genitourinary:    Genitourinary Comments:  Not pertinent to current symptomatology therefore not examined.   Musculoskeletal:     Cervical back: Neck supple.     Comments: Examination of her right knee reveals pain on the medial joint line.  Positive medial McMurray.  1+ effusion.  Range of motion 0-120 degrees.  Knee is stable with normal patella tracking.    Left knee has full range of motion without pain swelling or deformity     Assessment Principal Problem:   Acute medial  meniscus tear of right knee Active Problems:   Essential hypertension   Hyperglycemia   Hyperlipemia   Anxiety disorder    Plan Right knee arthroscopy with partial medial meniscectomy.  The risks, benefits, and possible complications of the procedure were discussed in detail with the patient.  The patient is without question.  [name], PA-C 06/21/2019, 11:46 AM

## 2019-06-25 ENCOUNTER — Ambulatory Visit (HOSPITAL_BASED_OUTPATIENT_CLINIC_OR_DEPARTMENT_OTHER): Payer: 59 | Admitting: Certified Registered"

## 2019-06-25 ENCOUNTER — Encounter (HOSPITAL_BASED_OUTPATIENT_CLINIC_OR_DEPARTMENT_OTHER): Payer: Self-pay | Admitting: Orthopedic Surgery

## 2019-06-25 ENCOUNTER — Other Ambulatory Visit: Payer: Self-pay

## 2019-06-25 ENCOUNTER — Encounter (HOSPITAL_BASED_OUTPATIENT_CLINIC_OR_DEPARTMENT_OTHER)
Admission: RE | Disposition: A | Discharge status: Home / Self Care | Payer: Self-pay | Source: Home / Self Care | Attending: Orthopedic Surgery

## 2019-06-25 ENCOUNTER — Ambulatory Visit (HOSPITAL_BASED_OUTPATIENT_CLINIC_OR_DEPARTMENT_OTHER)
Admission: RE | Admit: 2019-06-25 | Discharge: 2019-06-25 | Disposition: A | Discharge status: Home / Self Care | Payer: 59 | Attending: Orthopedic Surgery | Admitting: Orthopedic Surgery

## 2019-06-25 DIAGNOSIS — E785 Hyperlipidemia, unspecified: Secondary | ICD-10-CM | POA: Diagnosis present

## 2019-06-25 DIAGNOSIS — S83241A Other tear of medial meniscus, current injury, right knee, initial encounter: Secondary | ICD-10-CM | POA: Insufficient documentation

## 2019-06-25 DIAGNOSIS — F411 Generalized anxiety disorder: Secondary | ICD-10-CM | POA: Diagnosis present

## 2019-06-25 DIAGNOSIS — R739 Hyperglycemia, unspecified: Secondary | ICD-10-CM | POA: Diagnosis not present

## 2019-06-25 DIAGNOSIS — X58XXXA Exposure to other specified factors, initial encounter: Secondary | ICD-10-CM | POA: Diagnosis not present

## 2019-06-25 DIAGNOSIS — I10 Essential (primary) hypertension: Secondary | ICD-10-CM | POA: Insufficient documentation

## 2019-06-25 DIAGNOSIS — S83281A Other tear of lateral meniscus, current injury, right knee, initial encounter: Secondary | ICD-10-CM | POA: Diagnosis not present

## 2019-06-25 DIAGNOSIS — M94261 Chondromalacia, right knee: Secondary | ICD-10-CM | POA: Insufficient documentation

## 2019-06-25 DIAGNOSIS — Z79899 Other long term (current) drug therapy: Secondary | ICD-10-CM | POA: Insufficient documentation

## 2019-06-25 DIAGNOSIS — F419 Anxiety disorder, unspecified: Secondary | ICD-10-CM | POA: Diagnosis present

## 2019-06-25 HISTORY — DX: Other tear of lateral meniscus, current injury, right knee, initial encounter: S83.281A

## 2019-06-25 HISTORY — DX: Other tear of medial meniscus, current injury, right knee, initial encounter: S83.241A

## 2019-06-25 HISTORY — DX: Personal history of urinary calculi: Z87.442

## 2019-06-25 HISTORY — PX: KNEE ARTHROSCOPY WITH MEDIAL MENISECTOMY: SHX5651

## 2019-06-25 HISTORY — PX: CHONDROPLASTY: SHX5177

## 2019-06-25 SURGERY — ARTHROSCOPY, KNEE, WITH MEDIAL MENISCECTOMY
Anesthesia: General | Site: Knee | Laterality: Right

## 2019-06-25 MED ORDER — FENTANYL CITRATE (PF) 100 MCG/2ML IJ SOLN: 50.0000 ug | INTRAMUSCULAR | Status: DC | PRN

## 2019-06-25 MED ORDER — DEXMEDETOMIDINE HCL IN NACL 200 MCG/50ML IV SOLN
INTRAVENOUS | Status: AC
  Filled 2019-06-25: qty 50

## 2019-06-25 MED ORDER — MIDAZOLAM HCL 2 MG/2ML IJ SOLN: 1.0000 mg | INTRAMUSCULAR | Status: DC | PRN

## 2019-06-25 MED ORDER — LIDOCAINE 2% (20 MG/ML) 5 ML SYRINGE
INTRAMUSCULAR | Status: AC
  Filled 2019-06-25: qty 10

## 2019-06-25 MED ORDER — CEFAZOLIN SODIUM-DEXTROSE 2-4 GM/100ML-% IV SOLN
2.0000 g | INTRAVENOUS | Status: AC
  Administered 2019-06-25: 2 g via INTRAVENOUS

## 2019-06-25 MED ORDER — MIDAZOLAM HCL 2 MG/2ML IJ SOLN
INTRAMUSCULAR | Status: AC
  Filled 2019-06-25: qty 2

## 2019-06-25 MED ORDER — DEXAMETHASONE SODIUM PHOSPHATE 10 MG/ML IJ SOLN
INTRAMUSCULAR | Status: AC
  Filled 2019-06-25: qty 3

## 2019-06-25 MED ORDER — CEFAZOLIN SODIUM-DEXTROSE 2-4 GM/100ML-% IV SOLN
INTRAVENOUS | Status: AC
  Filled 2019-06-25: qty 100

## 2019-06-25 MED ORDER — PROMETHAZINE HCL 25 MG/ML IJ SOLN: 6.2500 mg | INTRAMUSCULAR | Status: DC | PRN

## 2019-06-25 MED ORDER — MIDAZOLAM HCL 5 MG/5ML IJ SOLN
INTRAMUSCULAR | Status: DC | PRN
  Administered 2019-06-25: 2 mg via INTRAVENOUS

## 2019-06-25 MED ORDER — HYDROMORPHONE HCL 1 MG/ML IJ SOLN
0.2500 mg | INTRAMUSCULAR | Status: DC | PRN
  Administered 2019-06-25 (×2): 0.25 mg via INTRAVENOUS

## 2019-06-25 MED ORDER — LACTATED RINGERS IV SOLN: INTRAVENOUS | Status: DC

## 2019-06-25 MED ORDER — EPINEPHRINE PF 1 MG/ML IJ SOLN
INTRAMUSCULAR | Status: AC
  Filled 2019-06-25: qty 1

## 2019-06-25 MED ORDER — HYDROCODONE-ACETAMINOPHEN 5-325 MG PO TABS: 1.0000 | ORAL_TABLET | ORAL | 0 refills | Status: AC | PRN

## 2019-06-25 MED ORDER — PROPOFOL 10 MG/ML IV BOLUS
INTRAVENOUS | Status: DC | PRN
  Administered 2019-06-25: 200 mg via INTRAVENOUS

## 2019-06-25 MED ORDER — PROPOFOL 10 MG/ML IV BOLUS
INTRAVENOUS | Status: AC
  Filled 2019-06-25: qty 20

## 2019-06-25 MED ORDER — SUCCINYLCHOLINE CHLORIDE 200 MG/10ML IV SOSY
PREFILLED_SYRINGE | INTRAVENOUS | Status: AC
  Filled 2019-06-25: qty 10

## 2019-06-25 MED ORDER — DEXAMETHASONE SODIUM PHOSPHATE 10 MG/ML IJ SOLN: 8.0000 mg | Freq: Once | INTRAMUSCULAR | Status: DC

## 2019-06-25 MED ORDER — ONDANSETRON HCL 4 MG/2ML IJ SOLN
INTRAMUSCULAR | Status: AC
  Filled 2019-06-25: qty 6

## 2019-06-25 MED ORDER — POVIDONE-IODINE 7.5 % EX SOLN: Freq: Once | CUTANEOUS | Status: DC

## 2019-06-25 MED ORDER — ONDANSETRON HCL 4 MG/2ML IJ SOLN
INTRAMUSCULAR | Status: DC | PRN
  Administered 2019-06-25: 4 mg via INTRAVENOUS

## 2019-06-25 MED ORDER — LIDOCAINE HCL (CARDIAC) PF 100 MG/5ML IV SOSY
PREFILLED_SYRINGE | INTRAVENOUS | Status: DC | PRN
  Administered 2019-06-25: 60 mg via INTRAVENOUS

## 2019-06-25 MED ORDER — DEXAMETHASONE SODIUM PHOSPHATE 10 MG/ML IJ SOLN
INTRAMUSCULAR | Status: DC | PRN
  Administered 2019-06-25: 5 mg via INTRAVENOUS

## 2019-06-25 MED ORDER — POVIDONE-IODINE 10 % EX SWAB: 2.0000 "application " | Freq: Once | CUTANEOUS | Status: DC

## 2019-06-25 MED ORDER — BUPIVACAINE-EPINEPHRINE 0.25% -1:200000 IJ SOLN
INTRAMUSCULAR | Status: DC | PRN
  Administered 2019-06-25: 20 mL

## 2019-06-25 MED ORDER — MEPERIDINE HCL 25 MG/ML IJ SOLN: 6.2500 mg | INTRAMUSCULAR | Status: DC | PRN

## 2019-06-25 MED ORDER — HYDROMORPHONE HCL 1 MG/ML IJ SOLN
INTRAMUSCULAR | Status: AC
  Filled 2019-06-25: qty 0.5

## 2019-06-25 MED ORDER — FENTANYL CITRATE (PF) 100 MCG/2ML IJ SOLN
INTRAMUSCULAR | Status: AC
  Filled 2019-06-25: qty 2

## 2019-06-25 MED ORDER — FENTANYL CITRATE (PF) 100 MCG/2ML IJ SOLN
INTRAMUSCULAR | Status: DC | PRN
  Administered 2019-06-25: 25 ug via INTRAVENOUS

## 2019-06-25 MED FILL — HYDROCODON-APAP 5-325: 5-325 | 4 days supply | Qty: 20 | Fill #0

## 2019-06-25 SURGICAL SUPPLY — 49 items
BLADE EXCALIBUR 4.0MM X 13CM (MISCELLANEOUS)
BLADE EXCALIBUR 4.0X13 (MISCELLANEOUS) IMPLANT
BLADE SURG 15 STRL LF DISP TIS (BLADE) IMPLANT
BLADE SURG 15 STRL SS (BLADE)
BNDG COHESIVE 4X5 TAN STRL (GAUZE/BANDAGES/DRESSINGS) IMPLANT
BNDG ELASTIC 6X5.8 VLCR STR LF (GAUZE/BANDAGES/DRESSINGS) ×3 IMPLANT
COVER WAND RF STERILE (DRAPES) IMPLANT
DISSECTOR  3.8MM X 13CM (MISCELLANEOUS)
DISSECTOR 3.8MM X 13CM (MISCELLANEOUS) IMPLANT
DISSECTOR 4.0MM X 13CM (MISCELLANEOUS) IMPLANT
DRAPE ARTHROSCOPY W/POUCH 90 (DRAPES) ×3 IMPLANT
DURAPREP 26ML APPLICATOR (WOUND CARE) ×3 IMPLANT
EXCALIBUR 3.8MM X 13CM (MISCELLANEOUS) IMPLANT
GAUZE SPONGE 4X4 12PLY STRL (GAUZE/BANDAGES/DRESSINGS) ×3 IMPLANT
GAUZE XEROFORM 1X8 LF (GAUZE/BANDAGES/DRESSINGS) ×3 IMPLANT
GLOVE BIOGEL PI IND STRL 7.0 (GLOVE) ×1 IMPLANT
GLOVE BIOGEL PI IND STRL 7.5 (GLOVE) ×1 IMPLANT
GLOVE BIOGEL PI INDICATOR 7.0 (GLOVE) ×2
GLOVE BIOGEL PI INDICATOR 7.5 (GLOVE) ×2
GLOVE SURG SS PI 7.0 STRL IVOR (GLOVE) ×6 IMPLANT
GLOVE SURG SYN 7.5  E (GLOVE) ×3
GLOVE SURG SYN 7.5 E (GLOVE) ×1 IMPLANT
GLOVE SURG SYN 7.5 PF PI (GLOVE) ×1 IMPLANT
GOWN STRL REUS W/ TWL LRG LVL3 (GOWN DISPOSABLE) ×2 IMPLANT
GOWN STRL REUS W/ TWL XL LVL3 (GOWN DISPOSABLE) ×1 IMPLANT
GOWN STRL REUS W/TWL LRG LVL3 (GOWN DISPOSABLE) ×6
GOWN STRL REUS W/TWL XL LVL3 (GOWN DISPOSABLE) ×3
HOLDER KNEE FOAM BLUE (MISCELLANEOUS) ×3 IMPLANT
KNEE WRAP E Z 3 GEL PACK (MISCELLANEOUS) ×3 IMPLANT
MANIFOLD NEPTUNE II (INSTRUMENTS) ×3 IMPLANT
NDL SAFETY ECLIPSE 18X1.5 (NEEDLE) ×2 IMPLANT
NEEDLE HYPO 18GX1.5 SHARP (NEEDLE) ×6
NEEDLE HYPO 22GX1.5 SAFETY (NEEDLE) ×3 IMPLANT
PACK ARTHROSCOPY DSU (CUSTOM PROCEDURE TRAY) ×3 IMPLANT
PACK BASIN DAY SURGERY FS (CUSTOM PROCEDURE TRAY) ×3 IMPLANT
PAD ALCOHOL SWAB (MISCELLANEOUS) IMPLANT
PENCIL SMOKE EVACUATOR (MISCELLANEOUS) ×3 IMPLANT
PORT APPOLLO RF 90DEGREE MULTI (SURGICAL WAND) IMPLANT
SUCTION FRAZIER HANDLE 10FR (MISCELLANEOUS)
SUCTION TUBE FRAZIER 10FR DISP (MISCELLANEOUS) IMPLANT
SUT ETHILON 4 0 PS 2 18 (SUTURE) ×3 IMPLANT
SUT PROLENE 3 0 PS 2 (SUTURE) IMPLANT
SUT VIC AB 3-0 PS1 18 (SUTURE)
SUT VIC AB 3-0 PS1 18XBRD (SUTURE) IMPLANT
SYR 20ML LL LF (SYRINGE) IMPLANT
SYR 5ML LL (SYRINGE) ×3 IMPLANT
TOWEL GREEN STERILE FF (TOWEL DISPOSABLE) ×3 IMPLANT
TUBING ARTHROSCOPY IRRIG 16FT (MISCELLANEOUS) ×3 IMPLANT
WATER STERILE IRR 1000ML POUR (IV SOLUTION) ×3 IMPLANT

## 2019-06-25 NOTE — Anesthesia Procedure Notes (Signed)
Procedure Name: LMA Insertion Date/Time: 06/25/2019 12:05 PM Performed by: Lauralyn Primes, CRNA Pre-anesthesia Checklist: Patient identified, Emergency Drugs available, Suction available and Patient being monitored Patient Re-evaluated:Patient Re-evaluated prior to induction Oxygen Delivery Method: Circle system utilized Preoxygenation: Pre-oxygenation with 100% oxygen Induction Type: IV induction Ventilation: Mask ventilation without difficulty LMA: LMA inserted LMA Size: 4.0 Number of attempts: 1 Airway Equipment and Method: Bite block Placement Confirmation: positive ETCO2 Tube secured with: Tape Dental Injury: Teeth and Oropharynx as per pre-operative assessment

## 2019-06-25 NOTE — Interval H&P Note (Signed)
History and Physical Interval Note:  06/25/2019 11:55 AM  Alice Ryan  has presented today for surgery, with the diagnosis of RIGHT KNEE MEDIAL AND LATERAL MENISCUS TEARS S83.249A [phone].  The various methods of treatment have been discussed with the patient and family. After consideration of risks, benefits and other options for treatment, the patient has consented to  Procedure(s): KNEE ARTHROSCOPY WITH LATERAL MENISECTOMY (Right) KNEE ARTHROSCOPY WITH MEDIAL MENISECTOMY (Right) as a surgical intervention.  The patient's history has been reviewed, patient examined, no change in status, stable for surgery.  I have reviewed the patient's chart and labs.  Questions were answered to the patient's satisfaction.     Nilda Simmer

## 2019-06-25 NOTE — Op Note (Signed)
NAME: Alice Ryan, CZERWINSKI MEDICAL RECORD DX:[id number] ACCOUNT [id number] DATE OF BIRTH:01-Jan-1961 FACILITY: [facility] LOCATION: [facility] PHYSICIAN:Emma-Lee Oddo Salley Slaughter, MD  OPERATIVE REPORT  DATE OF PROCEDURE:  06/25/2019  PREOPERATIVE DIAGNOSES:   1.  Right knee acute traumatic medial and lateral meniscal tears. 2.  Right knee chondromalacia.  POSTOPERATIVE DIAGNOSES: 1.  Right knee acute traumatic medial and lateral meniscal tears. 2.  Right knee chondromalacia.  PROCEDURES PERFORMED: 1.  Right knee exam under anesthesia. 2.  Arthroscopic partial medial and lateral meniscectomies with chondroplasty.  SURGEON:  Salvatore Marvel, MD   ASSISTANT:  Julien Girt, PA.  ANESTHESIA:  General.  OPERATIVE TIME:  30 minutes.  COMPLICATIONS:  None.  INDICATIONS:  The patient is a 59 year old woman who has had 8 months of increasing right knee pain with repetitive twisting injuries.  Exam and MRIs revealed medial and lateral meniscal tears with chondromalacia.  She has failed conservative care and is  now to undergo arthroscopy.  DESCRIPTION OF PROCEDURE:  The patient was brought to the operating room on 06/25/2019, placed on the operating table in supine position.  After being placed under general anesthesia, her right knee was examined.  She had full range of motion.  Knee was  stable with ligamentous exam with normal patellar tracking.  The right leg was prepped and using sterile DuraPrep and draped using sterile technique.  The knee was sterilely injected with 0.25% Marcaine with epinephrine.  Timeout procedure was called and  the correct right knee identified.  Initially through an anterolateral portal, the arthroscope with a pump attached was placed and through an anteromedial portal, an arthroscopic probe was placed.  On initial inspection of the medial compartment, the  articular cartilage showed a distinct grade IV chondral lesion comprising 20% of her medial femoral condyle  with loose articular cartilage pieces around this, which was debrided.  She had surrounding 30-40% grade III chondromalacia, which was debrided.   Medial tibial plateau showed only a grade II and mild grade III chondromalacia.  Medial meniscus showed tearing of the posteromedial horn of which 30-40% was resected back to a stable rim.  Intercondylar notch was inspected.  The anterior cruciate  ligament was moderately lax with 3-4 mm of anterior laxity.  PCL was intact and stable.  The lateral compartment showed grade I and II chondromalacia, lateral meniscus tear 20% posterolateral horn, which was resected back to a stable rim.  Patellofemoral  joint showed only grade I and II chondromalacia.  The patella tracked normally.  Medial and lateral gutters were free permanent pathology.  At this point, I felt that all pathology had been satisfactorily addressed.  The instruments were removed.   Portals closed with 3-0 nylon suture.  Sterile dressings were applied and the patient awakened and taken to the recovery room in stable condition.  FOLLOWUP CARE:  The patient will be followed as an outpatient on Norco for pain.  I will see her back in the office in a week for sutures out and followup.  VN/NUANCE  D:06/25/2019 T:06/25/2019 JOB:[id number]

## 2019-06-25 NOTE — Discharge Instructions (Signed)
°  Post Anesthesia Home Care Instructions ° °Activity: °Get plenty of rest for the remainder of the day. A responsible individual must stay with you for 24 hours following the procedure.  °For the next 24 hours, DO NOT: °-Drive a car °-Operate machinery °-Drink alcoholic beverages °-Take any medication unless instructed by your physician °-Make any legal decisions or sign important papers. ° °Meals: °Start with liquid foods such as gelatin or soup. Progress to regular foods as tolerated. Avoid greasy, spicy, heavy foods. If nausea and/or vomiting occur, drink only clear liquids until the nausea and/or vomiting subsides. Call your physician if vomiting continues. ° °Special Instructions/Symptoms: °Your throat may feel dry or sore from the anesthesia or the breathing tube placed in your throat during surgery. If this causes discomfort, gargle with warm salt water. The discomfort should disappear within 24 hours. ° °   °Call your surgeon if you experience:  ° °1.  Fever over 101.0. °2.  Inability to urinate. °3.  Nausea and/or vomiting. °4.  Extreme swelling or bruising at the surgical site. °5.  Continued bleeding from the incision. °6.  Increased pain, redness or drainage from the incision. °7.  Problems related to your pain medication. °8.  Any problems and/or concerns °

## 2019-06-25 NOTE — Anesthesia Preprocedure Evaluation (Signed)
Anesthesia Evaluation  Patient identified by MRN, date of birth, ID band Patient awake    Reviewed: Allergy & Precautions, NPO status , Patient's Chart, lab work & pertinent test results  Airway Mallampati: II  TM Distance: >3 FB Neck ROM: Full    Dental no notable dental hx.    Pulmonary neg pulmonary ROS,    Pulmonary exam normal breath sounds clear to auscultation       Cardiovascular hypertension, Pt. on medications Normal cardiovascular exam Rhythm:Regular Rate:Normal     Neuro/Psych Anxiety negative neurological ROS  negative psych ROS   GI/Hepatic Neg liver ROS, GERD  ,  Endo/Other  negative endocrine ROS  Renal/GU negative Renal ROS  negative genitourinary   Musculoskeletal negative musculoskeletal ROS (+)   Abdominal   Peds negative pediatric ROS (+)  Hematology negative hematology ROS (+)   Anesthesia Other Findings   Reproductive/Obstetrics negative OB ROS                             Anesthesia Physical  Anesthesia Plan  ASA: II  Anesthesia Plan: General   Post-op Pain Management:    Induction: Intravenous  PONV Risk Score and Plan: 3 and Ondansetron, Dexamethasone, Treatment may vary due to age or medical condition and Midazolam  Airway Management Planned: LMA  Additional Equipment:   Intra-op Plan:   Post-operative Plan: Extubation in OR  Informed Consent: I have reviewed the patients History and Physical, chart, labs and discussed the procedure including the risks, benefits and alternatives for the proposed anesthesia with the patient or authorized representative who has indicated his/her understanding and acceptance.     Dental advisory given  Plan Discussed with: CRNA and Surgeon  Anesthesia Plan Comments:         Anesthesia Quick Evaluation

## 2019-06-25 NOTE — Transfer of Care (Signed)
Immediate Anesthesia Transfer of Care Note  Patient: Alice Ryan [facility]  Procedure(s) Performed: KNEE ARTHROSCOPY WITH LATERAL MENISECTOMY (Right Knee) KNEE ARTHROSCOPY WITH MEDIAL MENISECTOMY (Right Knee)  Patient Location: PACU  Anesthesia Type:General  Level of Consciousness: drowsy  Airway & Oxygen Therapy: Patient Spontanous Breathing and Patient connected to face mask oxygen  Post-op Assessment: Report given to RN and Post -op Vital signs reviewed and stable  Post vital signs: Reviewed and stable  Last Vitals:  Vitals Value Taken Time  BP 145/80 06/25/19 1236  Temp    Pulse 81 06/25/19 1239  Resp 27 06/25/19 1239  SpO2 100 % 06/25/19 1239  Vitals shown include unvalidated device data.  Last Pain:  Vitals:   06/25/19 1029  TempSrc: Tympanic  PainSc: 0-No pain         Complications: No apparent anesthesia complications

## 2019-06-25 NOTE — Progress Notes (Signed)
TED hose applied to left lower ext.

## 2019-06-26 ENCOUNTER — Encounter: Payer: Self-pay | Admitting: *Deleted

## 2019-06-26 NOTE — Anesthesia Postprocedure Evaluation (Signed)
Anesthesia Post Note  Patient: Alice Ryan  Procedure(s) Performed: KNEE ARTHROSCOPY WITH MEDIAL MENISECTOMY (Right Knee) CHONDROPLASTY (Right Knee)     Patient location during evaluation: PACU Anesthesia Type: General Level of consciousness: awake and alert Pain management: pain level controlled Vital Signs Assessment: post-procedure vital signs reviewed and stable Respiratory status: spontaneous breathing, nonlabored ventilation and respiratory function stable Cardiovascular status: blood pressure returned to baseline and stable Postop Assessment: no apparent nausea or vomiting Anesthetic complications: no    Last Vitals:  Vitals:   06/25/19 1315 06/25/19 1358  BP: (!) 141/68 (!) 153/63  Pulse: (!) 56 (!) 54  Resp: 18 20  Temp:  36.6 C  SpO2: 100% 99%    Last Pain:  Vitals:   06/25/19 1358  TempSrc: Oral  PainSc: 0-No pain                 Lowella Curb

## 2019-07-02 DIAGNOSIS — S83241D Other tear of medial meniscus, current injury, right knee, subsequent encounter: Secondary | ICD-10-CM | POA: Diagnosis not present

## 2019-07-02 DIAGNOSIS — S83281D Other tear of lateral meniscus, current injury, right knee, subsequent encounter: Secondary | ICD-10-CM | POA: Diagnosis not present

## 2019-07-10 DIAGNOSIS — R262 Difficulty in walking, not elsewhere classified: Secondary | ICD-10-CM | POA: Diagnosis not present

## 2019-07-10 DIAGNOSIS — M6281 Muscle weakness (generalized): Secondary | ICD-10-CM | POA: Diagnosis not present

## 2019-07-10 DIAGNOSIS — M1711 Unilateral primary osteoarthritis, right knee: Secondary | ICD-10-CM | POA: Diagnosis not present

## 2019-07-12 DIAGNOSIS — S83241D Other tear of medial meniscus, current injury, right knee, subsequent encounter: Secondary | ICD-10-CM | POA: Diagnosis not present

## 2019-07-12 DIAGNOSIS — M6281 Muscle weakness (generalized): Secondary | ICD-10-CM | POA: Diagnosis not present

## 2019-07-12 DIAGNOSIS — M1711 Unilateral primary osteoarthritis, right knee: Secondary | ICD-10-CM | POA: Diagnosis not present

## 2019-07-12 DIAGNOSIS — M25661 Stiffness of right knee, not elsewhere classified: Secondary | ICD-10-CM | POA: Diagnosis not present

## 2019-07-16 DIAGNOSIS — R262 Difficulty in walking, not elsewhere classified: Secondary | ICD-10-CM | POA: Diagnosis not present

## 2019-07-16 DIAGNOSIS — M25661 Stiffness of right knee, not elsewhere classified: Secondary | ICD-10-CM | POA: Diagnosis not present

## 2019-07-16 DIAGNOSIS — S83241D Other tear of medial meniscus, current injury, right knee, subsequent encounter: Secondary | ICD-10-CM | POA: Diagnosis not present

## 2019-07-16 DIAGNOSIS — M1711 Unilateral primary osteoarthritis, right knee: Secondary | ICD-10-CM | POA: Diagnosis not present

## 2019-07-18 DIAGNOSIS — M1711 Unilateral primary osteoarthritis, right knee: Secondary | ICD-10-CM | POA: Diagnosis not present

## 2019-07-18 DIAGNOSIS — S83241D Other tear of medial meniscus, current injury, right knee, subsequent encounter: Secondary | ICD-10-CM | POA: Diagnosis not present

## 2019-07-18 DIAGNOSIS — M6281 Muscle weakness (generalized): Secondary | ICD-10-CM | POA: Diagnosis not present

## 2019-07-18 DIAGNOSIS — R262 Difficulty in walking, not elsewhere classified: Secondary | ICD-10-CM | POA: Diagnosis not present

## 2019-07-22 DIAGNOSIS — M1711 Unilateral primary osteoarthritis, right knee: Secondary | ICD-10-CM | POA: Diagnosis not present

## 2019-07-22 DIAGNOSIS — M25661 Stiffness of right knee, not elsewhere classified: Secondary | ICD-10-CM | POA: Diagnosis not present

## 2019-07-22 DIAGNOSIS — S83241D Other tear of medial meniscus, current injury, right knee, subsequent encounter: Secondary | ICD-10-CM | POA: Diagnosis not present

## 2019-07-22 DIAGNOSIS — S83281D Other tear of lateral meniscus, current injury, right knee, subsequent encounter: Secondary | ICD-10-CM | POA: Diagnosis not present

## 2019-07-23 DIAGNOSIS — M1711 Unilateral primary osteoarthritis, right knee: Secondary | ICD-10-CM | POA: Diagnosis not present

## 2019-07-30 DIAGNOSIS — M1711 Unilateral primary osteoarthritis, right knee: Secondary | ICD-10-CM | POA: Diagnosis not present

## 2019-07-30 DIAGNOSIS — R262 Difficulty in walking, not elsewhere classified: Secondary | ICD-10-CM | POA: Diagnosis not present

## 2019-07-30 DIAGNOSIS — M6281 Muscle weakness (generalized): Secondary | ICD-10-CM | POA: Diagnosis not present

## 2019-07-30 DIAGNOSIS — M25661 Stiffness of right knee, not elsewhere classified: Secondary | ICD-10-CM | POA: Diagnosis not present

## 2019-08-01 DIAGNOSIS — M25661 Stiffness of right knee, not elsewhere classified: Secondary | ICD-10-CM | POA: Diagnosis not present

## 2019-08-01 DIAGNOSIS — M6281 Muscle weakness (generalized): Secondary | ICD-10-CM | POA: Diagnosis not present

## 2019-08-01 DIAGNOSIS — M25561 Pain in right knee: Secondary | ICD-10-CM | POA: Diagnosis not present

## 2019-08-01 DIAGNOSIS — R262 Difficulty in walking, not elsewhere classified: Secondary | ICD-10-CM | POA: Diagnosis not present

## 2019-08-05 DIAGNOSIS — M1711 Unilateral primary osteoarthritis, right knee: Secondary | ICD-10-CM | POA: Diagnosis not present

## 2019-08-05 DIAGNOSIS — M25661 Stiffness of right knee, not elsewhere classified: Secondary | ICD-10-CM | POA: Diagnosis not present

## 2019-08-05 DIAGNOSIS — M6281 Muscle weakness (generalized): Secondary | ICD-10-CM | POA: Diagnosis not present

## 2019-08-05 DIAGNOSIS — R262 Difficulty in walking, not elsewhere classified: Secondary | ICD-10-CM | POA: Diagnosis not present

## 2019-08-05 MED FILL — LOSARTAN-HCTZ 50-12.5 MG TA: 50-12.5 | 30 days supply | Qty: 30 | Fill #2

## 2019-08-08 DIAGNOSIS — M25661 Stiffness of right knee, not elsewhere classified: Secondary | ICD-10-CM | POA: Diagnosis not present

## 2019-08-08 DIAGNOSIS — M6281 Muscle weakness (generalized): Secondary | ICD-10-CM | POA: Diagnosis not present

## 2019-08-08 DIAGNOSIS — M1711 Unilateral primary osteoarthritis, right knee: Secondary | ICD-10-CM | POA: Diagnosis not present

## 2019-08-08 DIAGNOSIS — R262 Difficulty in walking, not elsewhere classified: Secondary | ICD-10-CM | POA: Diagnosis not present

## 2019-08-12 DIAGNOSIS — M25661 Stiffness of right knee, not elsewhere classified: Secondary | ICD-10-CM | POA: Diagnosis not present

## 2019-08-12 DIAGNOSIS — M1711 Unilateral primary osteoarthritis, right knee: Secondary | ICD-10-CM | POA: Diagnosis not present

## 2019-08-12 DIAGNOSIS — M6281 Muscle weakness (generalized): Secondary | ICD-10-CM | POA: Diagnosis not present

## 2019-08-12 DIAGNOSIS — S83241D Other tear of medial meniscus, current injury, right knee, subsequent encounter: Secondary | ICD-10-CM | POA: Diagnosis not present

## 2019-08-16 ENCOUNTER — Telehealth: Payer: Self-pay | Admitting: Medical Oncology

## 2019-08-16 NOTE — Telephone Encounter (Signed)
err

## 2019-08-22 DIAGNOSIS — M1711 Unilateral primary osteoarthritis, right knee: Secondary | ICD-10-CM | POA: Diagnosis not present

## 2019-08-22 DIAGNOSIS — M25561 Pain in right knee: Secondary | ICD-10-CM | POA: Diagnosis not present

## 2019-08-22 DIAGNOSIS — M25661 Stiffness of right knee, not elsewhere classified: Secondary | ICD-10-CM | POA: Diagnosis not present

## 2019-08-22 DIAGNOSIS — R262 Difficulty in walking, not elsewhere classified: Secondary | ICD-10-CM | POA: Diagnosis not present

## 2019-08-22 DIAGNOSIS — M6281 Muscle weakness (generalized): Secondary | ICD-10-CM | POA: Diagnosis not present

## 2019-11-19 ENCOUNTER — Other Ambulatory Visit: Payer: Self-pay | Admitting: Family Medicine

## 2019-11-19 DIAGNOSIS — I10 Essential (primary) hypertension: Secondary | ICD-10-CM

## 2019-11-19 MED FILL — LOSARTAN-HCTZ 50-12.5 MG TA: 50-12.5 | 30 days supply | Qty: 30 | Fill #0

## 2019-12-03 ENCOUNTER — Other Ambulatory Visit: Payer: Self-pay

## 2019-12-03 ENCOUNTER — Ambulatory Visit (INDEPENDENT_AMBULATORY_CARE_PROVIDER_SITE_OTHER): Payer: 59 | Admitting: Family Medicine

## 2019-12-03 ENCOUNTER — Encounter: Payer: Self-pay | Admitting: Family Medicine

## 2019-12-03 ENCOUNTER — Other Ambulatory Visit: Payer: Self-pay | Admitting: Family Medicine

## 2019-12-03 VITALS — BP 128/80 | HR 68 | Temp 97.6°F | Resp 16 | Ht 61.0 in | Wt 138.1 lb

## 2019-12-03 DIAGNOSIS — Z130 Encounter for screening for diseases of the blood and blood-forming organs and certain disorders involving the immune mechanism: Secondary | ICD-10-CM | POA: Diagnosis not present

## 2019-12-03 DIAGNOSIS — I10 Essential (primary) hypertension: Secondary | ICD-10-CM

## 2019-12-03 DIAGNOSIS — Z1329 Encounter for screening for other suspected endocrine disorder: Secondary | ICD-10-CM

## 2019-12-03 DIAGNOSIS — F419 Anxiety disorder, unspecified: Secondary | ICD-10-CM | POA: Diagnosis not present

## 2019-12-03 DIAGNOSIS — Z0000 Encounter for general adult medical examination without abnormal findings: Secondary | ICD-10-CM | POA: Diagnosis not present

## 2019-12-03 DIAGNOSIS — E785 Hyperlipidemia, unspecified: Secondary | ICD-10-CM | POA: Diagnosis not present

## 2019-12-03 DIAGNOSIS — Z13228 Encounter for screening for other metabolic disorders: Secondary | ICD-10-CM

## 2019-12-03 MED ORDER — LOSARTAN POTASSIUM-HCTZ 50-12.5 MG PO TABS: 1.0000 | ORAL_TABLET | Freq: Every day | ORAL | 2 refills | Status: DC

## 2019-12-03 MED ORDER — SERTRALINE HCL 50 MG PO TABS: 50.0000 mg | ORAL_TABLET | Freq: Every day | ORAL | 2 refills | Status: DC

## 2019-12-03 MED FILL — SERTRALINE HCL 50 MG TABLET: 50 | 90 days supply | Qty: 90 | Fill #0

## 2019-12-03 NOTE — Patient Instructions (Signed)
Today you have you routine preventive visit. A few things to remember from today's visit:  Routine general medical examination at a health care facility  Hyperlipidemia, unspecified hyperlipidemia type - Plan: Lipid panel  Essential hypertension  Screening for endocrine, metabolic and immunity disorder - Plan: Basic metabolic panel, Hemoglobin A1c  If you need refills please call your pharmacy. Do not use My Chart to request refills or for acute issues that need immediate attention.   Please be sure medication list is accurate. If a new problem present, please set up appointment sooner than planned today.  At least 150 minutes of moderate exercise per week, daily brisk walking for 15-30 min is a good exercise option. Healthy diet low in saturated (animal) fats and sweets and consisting of fresh fruits and vegetables, lean meats such as fish and white chicken and whole grains.  These are some of recommendations for screening depending of age and risk factors:  - Vaccines:  Tdap vaccine every 10 years.  Shingles vaccine recommended at age 44, could be given after 57 years of age but not sure about insurance coverage.   Pneumonia vaccines: Pneumovax at 65. Sometimes Pneumovax is giving earlier if history of smoking, lung disease,diabetes,kidney disease among some.  Screening for diabetes at age 62 and every 3 years.  Cervical cancer prevention:  Pap smear starts at 57 years of age and continues periodically until 57 years old in low risk women. Pap smear every 3 years between 27 and 47 years old. Pap smear every 3-5 years between women 30 and older if pap smear negative and HPV screening negative.   -Breast cancer: Mammogram: There is disagreement between experts about when to start screening in low risk asymptomatic female but recent recommendations are to start screening at 1 and not later than 57 years old , every 1-2 years and after 57 yo q 2 years. Screening is recommended  until 57 years old but some women can continue screening depending of healthy issues.  Colon cancer screening: Has been recently changed to 57 yo. Insurance may not cover until you are 57 years old. Screening is recommended until 57 years old.  Cholesterol disorder screening at age 43 and every 3 years.N/A  Also recommended:  1. Dental visit- Brush and floss your teeth twice daily; visit your dentist twice a year. 2. Eye doctor- Get an eye exam at least every 2 years. 3. Helmet use- Always wear a helmet when riding a bicycle, motorcycle, rollerblading or skateboarding. 4. Safe sex- If you may be exposed to sexually transmitted infections, use a condom. 5. Seat belts- Seat belts can save your live; always wear one. 6. Smoke/Carbon Monoxide detectors- These detectors need to be installed on the appropriate level of your home. Replace batteries at least once a year. 7. Skin cancer- When out in the sun please cover up and use sunscreen 15 SPF or higher. 8. Violence- If anyone is threatening or hurting you, please tell your healthcare provider.  9. Drink alcohol in moderation- Limit alcohol intake to one drink or less per day. Never drink and drive. 10. Calcium supplementation 1000 to 1200 mg daily, ideally through your diet.  Vitamin D supplementation 800 units daily.

## 2019-12-03 NOTE — Progress Notes (Signed)
HPI: Alice Ryan is a 59 y.o. female, who is here today for her routine physical.  Last CPE: 08/07/17. She was last seen on 05/01/19,virtual visit for acute problem.  Regular exercise 3 or more time per week: She has not exercise regularly since knee surgery, completed PT. Mowing her yards weekly, which takes around 45 min. Following a healthy diet: Not consistently. She lives with her husband.  Chronic medical problems: OA,HLD,HTN,HLD,and allergic rhinitis among some.  Pap smear:08/07/2017, negative for malignancy and HPV was not detected. Hx of abnormal pap smears: Negative.  Immunization History  Administered Date(s) Administered  . Influenza Split 01/09/2012  . Influenza,inj,Quad PF,6+ Mos 12/28/2012, 01/14/2016, 01/08/2019  . Pneumococcal Polysaccharide-23 04/05/2011  . Tdap 04/05/2011    Mammogram: 11/05/18. Colonoscopy: Cologuard negative on 09/06/17. DEXA: N/A  Concerns today: Nose "stuff up" all the time. No rhinorrhea or postnasal drainage. "Very loud" snoring, no witnessed apnea. She has been prescribed Nasonex for allergic rhinitis, she has not used it recently but think is had helped in the past.  Anxiety: She is on Sertraline 50 mg, some times she takes 75 mg. She is not sure if it is helping. She is under a lot of stress, husband with stag IV colon cancer. She feels like she is dealing well with stress.  HTN: She is on Losartan-HCTZ 50-12,5 mg daily. HLD:She is on Atorvastatin 20 mg daily. Tolerating medications well.  Review of Systems  Constitutional: Positive for fatigue. Negative for appetite change and fever.  HENT: Negative for dental problem, hearing loss, mouth sores, sore throat and trouble swallowing.   Eyes: Negative for redness and visual disturbance.  Respiratory: Negative for cough, shortness of breath and wheezing.   Cardiovascular: Negative for chest pain and leg swelling.  Gastrointestinal: Negative for abdominal pain, nausea  and vomiting.       No changes in bowel habits.  Endocrine: Negative for cold intolerance, heat intolerance, polydipsia, polyphagia and polyuria.  Genitourinary: Negative for decreased urine volume, dysuria, hematuria, vaginal bleeding and vaginal discharge.  Musculoskeletal: Positive for arthralgias. Negative for gait problem and myalgias.  Skin: Negative for color change and rash.  Allergic/Immunologic: Positive for environmental allergies.  Neurological: Negative for syncope, weakness and headaches.  Hematological: Negative for adenopathy. Does not bruise/bleed easily.  Psychiatric/Behavioral: Negative for confusion and sleep disturbance. The patient is nervous/anxious.   All other systems reviewed and are negative.  Current Outpatient Medications on File Prior to Visit  Medication Sig Dispense Refill  . atorvastatin (LIPITOR) 20 MG tablet TAKE 1 TABLET (20 MG TOTAL) BY MOUTH DAILY. 90 tablet 1  . diclofenac sodium (VOLTAREN) 1 % GEL Apply 4 g topically 4 (four) times daily. 150 g 1  . HYDROcodone-acetaminophen (NORCO/VICODIN) 5-325 MG tablet Take 1 tablet by mouth every 4 (four) hours as needed for moderate pain. 20 tablet 0  . mometasone (NASONEX) 50 MCG/ACT nasal spray PLACE 2 SPRAYS INTO THE NOSE DAILY. 17 g 0  . valACYclovir (VALTREX) 1000 MG tablet TAKE 2 TABLETS BY MOUTH TWICE DAILY FOR 1 DAY. TAKE AT FIST SIGNS OF OUTBREAK. 30 tablet 1   No current facility-administered medications on file prior to visit.     Past Medical History:  Diagnosis Date  . Acid reflux   . Acute lateral meniscus tear of right knee   . Acute medial meniscus tear of right knee   . Anxiety   . Blood in stool   . GERD (gastroesophageal reflux disease)   . History of  kidney stones   . Hypercholesteremia   . Hyperlipidemia   . Hypertension   . Hypertension     Past Surgical History:  Procedure Laterality Date  . BREAST EXCISIONAL BIOPSY Left 2011   benign  . BREAST LUMPECTOMY WITH RADIOACTIVE  SEED LOCALIZATION Right 12/13/2018   Procedure: RIGHT BREAST LUMPECTOMY WITH RADIOACTIVE SEED LOCALIZATION;  Surgeon: Harriette Bouillon, MD;  Location: [facility];  Service: General;  Laterality: Right;  . CHONDROPLASTY Right 06/25/2019   Procedure: CHONDROPLASTY;  Surgeon: Salvatore Marvel, MD;  Location: [facility];  Service: Orthopedics;  Laterality: Right;  . CYST EXCISION PERINEAL N/A 06/13/2016   Procedure: CYST EXCISION PERIANAL, CHRONIC INFECTED;  Surgeon: Jimmye Norman, MD;  Location: [facility];  Service: General;  Laterality: N/A;  . EVALUATION UNDER ANESTHESIA WITH HEMORRHOIDECTOMY N/A 06/13/2016   Procedure: EXAM UNDER ANESTHESIA,  HEMORRHOIDECTOMY;  Surgeon: Jimmye Norman, MD;  Location: [facility];  Service: General;  Laterality: N/A;  . KNEE ARTHROSCOPY WITH MEDIAL MENISECTOMY Right 06/25/2019   Procedure: KNEE ARTHROSCOPY WITH MEDIAL MENISECTOMY;  Surgeon: Salvatore Marvel, MD;  Location: [facility];  Service: Orthopedics;  Laterality: Right;  . lumpectomy right breast  2011   benign  . TRIGGER FINGER RELEASE Right 07/31/2017   Procedure: RELEASE TRIGGER FINGER/A-1 PULLEY RIGHT RING FINGER;  Surgeon: Betha Loa, MD;  Location: [facility];  Service: Orthopedics;  Laterality: Right;    Allergies  Allergen Reactions  . Apple Anaphylaxis  . Latex Anaphylaxis    Latex condom  . Chlorhexidine Gluconate Itching  . Flounder [Fish Allergy] Swelling  . Other Swelling  . Oxycodone-Acetaminophen Other (See Comments)    "knocked me out" Other reaction(s): Other (See Comments) "knocked me out"  . Peanut Butter Flavor Itching    Family History  Problem Relation Age of Onset  . Cancer Mother        breast cancer at age 23  . Stroke Mother   . Hypertension Mother   . Breast cancer Mother 80  . Hypertension Father     Social History   Socioeconomic History  . Marital status: Married    Spouse name:  Not on file  . Number of children: Not on file  . Years of education: Not on file  . Highest education level: Not on file  Occupational History  . Not on file  Tobacco Use  . Smoking status: Never Smoker  . Smokeless tobacco: Never Used  Substance and Sexual Activity  . Alcohol use: Yes    Comment: social  . Drug use: No  . Sexual activity: Not on file  Other Topics Concern  . Not on file  Social History Narrative  . Not on file   Social Determinants of Health   Financial Resource Strain:   . Difficulty of Paying Living Expenses: Not on file  Food Insecurity:   . Worried About Programme researcher, broadcasting/film/video in the Last Year: Not on file  . Ran Out of Food in the Last Year: Not on file  Transportation Needs:   . Lack of Transportation (Medical): Not on file  . Lack of Transportation (Non-Medical): Not on file  Physical Activity:   . Days of Exercise per Week: Not on file  . Minutes of Exercise per Session: Not on file  Stress:   . Feeling of Stress : Not on file  Social Connections:   . Frequency of Communication with Friends and Family: Not on file  .  Frequency of Social Gatherings with Friends and Family: Not on file  . Attends Religious Services: Not on file  . Active Member of Clubs or Organizations: Not on file  . Attends Banker Meetings: Not on file  . Marital Status: Not on file   Vitals:   12/03/19 1149  BP: 128/80  Pulse: 68  Resp: 16  Temp: 97.6 F (36.4 C)  SpO2: 96%   Body mass index is 26.1 kg/m.  Wt Readings from Last 3 Encounters:  12/03/19 138 lb 2 oz (62.7 kg)  06/25/19 136 lb 7.4 oz (61.9 kg)  01/08/19 139 lb 8 oz (63.3 kg)    Physical Exam Vitals and nursing note reviewed.  Constitutional:      General: She is not in acute distress.    Appearance: She is well-developed.  HENT:     Head: Normocephalic and atraumatic.     Right Ear: Hearing, tympanic membrane, ear canal and external ear normal.     Left Ear: Hearing, tympanic  membrane, ear canal and external ear normal.     Mouth/Throat:     Pharynx: Uvula midline.  Eyes:     Extraocular Movements: Extraocular movements intact.     Conjunctiva/sclera: Conjunctivae normal.     Pupils: Pupils are equal, round, and reactive to light.  Neck:     Thyroid: No thyromegaly.     Trachea: No tracheal deviation.  Cardiovascular:     Rate and Rhythm: Normal rate and regular rhythm.     Pulses:          Dorsalis pedis pulses are 2+ on the right side and 2+ on the left side.     Heart sounds: No murmur heard.   Pulmonary:     Effort: Pulmonary effort is normal. No respiratory distress.     Breath sounds: Normal breath sounds.  Abdominal:     Palpations: Abdomen is soft. There is no hepatomegaly or mass.     Tenderness: There is no abdominal tenderness.  Genitourinary:    Comments: Breast:No masses, skin changes, or nipple discharge bilateral. Musculoskeletal:     Comments: No signs of synovitis appreciated.  Lymphadenopathy:     Cervical: No cervical adenopathy.     Upper Body:     Right upper body: No supraclavicular or axillary adenopathy.     Left upper body: No supraclavicular or axillary adenopathy.  Skin:    General: Skin is warm.     Findings: No erythema or rash.  Neurological:     General: No focal deficit present.     Mental Status: She is alert and oriented to person, place, and time.     Cranial Nerves: No cranial nerve deficit.     Coordination: Coordination normal.     Gait: Gait normal.     Deep Tendon Reflexes:     Reflex Scores:      Bicep reflexes are 2+ on the right side and 2+ on the left side.      Patellar reflexes are 2+ on the right side and 2+ on the left side. Psychiatric:        Mood and Affect: Mood and affect normal.     Comments: Well groomed, good eye contact.   ASSESSMENT AND PLAN:  Ms. Keeleigh Terris was here today annual physical examination.  Orders Placed This Encounter  Procedures  . Basic metabolic  panel  . Lipid panel  . Hemoglobin A1c    Lab Results  Component Value  Date   HGBA1C 5.9 (H) 12/03/2019   Lab Results  Component Value Date   CREATININE 0.82 12/03/2019   BUN 14 12/03/2019   NA 143 12/03/2019   K 3.4 (L) 12/03/2019   CL 107 12/03/2019   CO2 21 12/03/2019   Lab Results  Component Value Date   CHOL 269 (H) 12/03/2019   HDL 80 12/03/2019   LDLCALC 169 (H) 12/03/2019   LDLDIRECT 165.0 02/26/2013   TRIG 94 12/03/2019   CHOLHDL 3.4 12/03/2019   Routine general medical examination at a health care facility We discussed the importance of regular physical activity and healthy diet for prevention of chronic illness and/or complications. Preventive guidelines reviewed. Vaccination up to date. She will call to arrange mammogram. Cologuard due on 09/06/20. Pap smear due in 08/2022. Ca++ and vit D supplementation recommended. Next CPE in a year.  Hyperlipidemia, unspecified hyperlipidemia type Continue Atorvastatin 20 mg daily, dose will be adjusted according to FLP results.  Essential hypertension BP adequately controlled. No changes in current management.  -     losartan-hydrochlorothiazide (HYZAAR) 50-12.5 MG tablet; Take 1 tablet by mouth daily.  Screening for endocrine, metabolic and immunity disorder -     Hemoglobin A1c -     Basic metabolic panel  Anxiety disorder, unspecified type Stable. For now continue current management.  -     sertraline (ZOLOFT) 50 MG tablet; Take 1 tablet (50 mg total) by mouth daily.   Return in 6 months (on 06/01/2020) for htn.   Kalleigh Harbor G. Swaziland, MD  [facility]. [place] [facility].   Today you have you routine preventive visit. A few things to remember from today's visit:  Routine general medical examination at a health care facility  Hyperlipidemia, unspecified hyperlipidemia type - Plan: Lipid panel  Essential hypertension  Screening for endocrine, metabolic and immunity disorder - Plan: Basic  metabolic panel, Hemoglobin A1c  If you need refills please call your pharmacy. Do not use My Chart to request refills or for acute issues that need immediate attention.   Please be sure medication list is accurate. If a new problem present, please set up appointment sooner than planned today.  At least 150 minutes of moderate exercise per week, daily brisk walking for 15-30 min is a good exercise option. Healthy diet low in saturated (animal) fats and sweets and consisting of fresh fruits and vegetables, lean meats such as fish and white chicken and whole grains.  These are some of recommendations for screening depending of age and risk factors:  - Vaccines:  Tdap vaccine every 10 years.  Shingles vaccine recommended at age 54, could be given after 57 years of age but not sure about insurance coverage.   Pneumonia vaccines: Pneumovax at 65. Sometimes Pneumovax is giving earlier if history of smoking, lung disease,diabetes,kidney disease among some.  Screening for diabetes at age 55 and every 3 years.  Cervical cancer prevention:  Pap smear starts at 57 years of age and continues periodically until 57 years old in low risk women. Pap smear every 3 years between 49 and 64 years old. Pap smear every 3-5 years between women 30 and older if pap smear negative and HPV screening negative.   -Breast cancer: Mammogram: There is disagreement between experts about when to start screening in low risk asymptomatic female but recent recommendations are to start screening at 44 and not later than 57 years old , every 1-2 years and after 57 yo q 2 years. Screening is recommended until  57 years old but some women can continue screening depending of healthy issues.  Colon cancer screening: Has been recently changed to 57 yo. Insurance may not cover until you are 57 years old. Screening is recommended until 57 years old.  Cholesterol disorder screening at age 57 and every 3 years.N/A  Also  recommended:  1. Dental visit- Brush and floss your teeth twice daily; visit your dentist twice a year. 2. Eye doctor- Get an eye exam at least every 2 years. 3. Helmet use- Always wear a helmet when riding a bicycle, motorcycle, rollerblading or skateboarding. 4. Safe sex- If you may be exposed to sexually transmitted infections, use a condom. 5. Seat belts- Seat belts can save your live; always wear one. 6. Smoke/Carbon Monoxide detectors- These detectors need to be installed on the appropriate level of your home. Replace batteries at least once a year. 7. Skin cancer- When out in the sun please cover up and use sunscreen 15 SPF or higher. 8. Violence- If anyone is threatening or hurting you, please tell your healthcare provider.  9. Drink alcohol in moderation- Limit alcohol intake to one drink or less per day. Never drink and drive. 10. Calcium supplementation 1000 to 1200 mg daily, ideally through your diet.  Vitamin D supplementation 800 units daily.

## 2019-12-04 LAB — BASIC METABOLIC PANEL
BUN: 14 mg/dL (ref 7–25)
CO2: 21 mmol/L (ref 20–32)
Calcium: 9.5 mg/dL (ref 8.6–10.4)
Chloride: 107 mmol/L (ref 98–110)
Creat: 0.82 mg/dL (ref 0.50–1.05)
Glucose, Bld: 93 mg/dL (ref 65–99)
Potassium: 3.4 mmol/L — ABNORMAL LOW (ref 3.5–5.3)
Sodium: 143 mmol/L (ref 135–146)

## 2019-12-04 LAB — LIPID PANEL
Cholesterol: 269 mg/dL — ABNORMAL HIGH (ref <200)
HDL: 80 mg/dL (ref >=50)
LDL Cholesterol (Calc): 169 mg/dL (calc) — ABNORMAL HIGH
Non-HDL Cholesterol (Calc): 189 mg/dL (calc) — ABNORMAL HIGH (ref <130)
Total CHOL/HDL Ratio: 3.4 (calc) (ref <5.0)
Triglycerides: 94 mg/dL (ref <150)

## 2019-12-04 LAB — HEMOGLOBIN A1C
Hgb A1c MFr Bld: 5.9 % of total Hgb — ABNORMAL HIGH (ref <5.7)
Mean Plasma Glucose: 123 (calc)
eAG (mmol/L): 6.8 (calc)

## 2019-12-10 ENCOUNTER — Telehealth: Payer: Self-pay | Admitting: Family Medicine

## 2019-12-10 NOTE — Telephone Encounter (Signed)
Patient seen 12/03/19 for her cpe.

## 2019-12-10 NOTE — Telephone Encounter (Signed)
Patient is having trouble sleeping and would like know if Dr. Swaziland can call in something to help.  Pt uses [facility] - [place], [place] -  [address] [place] [address]  Phone:  [phone] Fax:  [phone]

## 2019-12-13 ENCOUNTER — Other Ambulatory Visit: Payer: Self-pay

## 2019-12-13 ENCOUNTER — Other Ambulatory Visit: Payer: Self-pay | Admitting: Family Medicine

## 2019-12-13 MED ORDER — ATORVASTATIN CALCIUM 40 MG PO TABS: 40.0000 mg | ORAL_TABLET | Freq: Every day | ORAL | 3 refills | Status: DC

## 2019-12-13 MED FILL — ATORVASTATIN 40 MG TABLET: 40 | 90 days supply | Qty: 90 | Fill #0

## 2019-12-13 NOTE — Telephone Encounter (Signed)
I spoke with pt. She picked up something over the counter & it has been working. She will let us know if she needs something different.

## 2019-12-23 MED FILL — FLUARIX QUADRIVALENT 0.5 ML: 0.5 | 1 days supply | Qty: 1 | Fill #0

## 2020-02-17 MED FILL — SERTRALINE HCL 50 MG TABLET: 50 | 90 days supply | Qty: 90 | Fill #0

## 2020-03-02 ENCOUNTER — Other Ambulatory Visit (HOSPITAL_COMMUNITY): Payer: Self-pay | Admitting: Otolaryngology

## 2020-03-02 DIAGNOSIS — J310 Chronic rhinitis: Secondary | ICD-10-CM | POA: Diagnosis not present

## 2020-03-02 DIAGNOSIS — J343 Hypertrophy of nasal turbinates: Secondary | ICD-10-CM | POA: Diagnosis not present

## 2020-03-02 DIAGNOSIS — J342 Deviated nasal septum: Secondary | ICD-10-CM | POA: Diagnosis not present

## 2020-03-02 DIAGNOSIS — J330 Polyp of nasal cavity: Secondary | ICD-10-CM | POA: Diagnosis not present

## 2020-03-02 MED FILL — FLUTICASONE PROP 50 MCG SPR: 50 | 30 days supply | Qty: 16 | Fill #0

## 2020-03-02 MED FILL — predniSONE 10 MG TABS: 10 | 12 days supply | Qty: 48 | Fill #0

## 2020-03-23 ENCOUNTER — Ambulatory Visit: Payer: 59 | Admitting: Family Medicine

## 2020-03-23 DIAGNOSIS — Z0289 Encounter for other administrative examinations: Secondary | ICD-10-CM

## 2020-05-28 MED FILL — ATORVASTATIN 40 MG TABLET: 40 | 90 days supply | Qty: 90 | Fill #1

## 2020-05-28 MED FILL — LOSARTAN-HCTZ 50-12.5 MG TA: 50-12.5 | 30 days supply | Qty: 30 | Fill #1

## 2020-06-02 DIAGNOSIS — S83241A Other tear of medial meniscus, current injury, right knee, initial encounter: Secondary | ICD-10-CM | POA: Diagnosis not present

## 2020-06-30 DIAGNOSIS — S83241D Other tear of medial meniscus, current injury, right knee, subsequent encounter: Secondary | ICD-10-CM | POA: Diagnosis not present

## 2020-07-01 ENCOUNTER — Other Ambulatory Visit: Payer: Self-pay | Admitting: Family Medicine

## 2020-07-01 ENCOUNTER — Ambulatory Visit: Payer: 59 | Admitting: Family Medicine

## 2020-07-01 ENCOUNTER — Encounter: Payer: Self-pay | Admitting: Family Medicine

## 2020-07-01 ENCOUNTER — Other Ambulatory Visit: Payer: Self-pay

## 2020-07-01 VITALS — BP 144/80 | HR 66 | Resp 12 | Ht 61.0 in | Wt 132.0 lb

## 2020-07-01 DIAGNOSIS — E785 Hyperlipidemia, unspecified: Secondary | ICD-10-CM | POA: Diagnosis not present

## 2020-07-01 DIAGNOSIS — R0789 Other chest pain: Secondary | ICD-10-CM | POA: Diagnosis not present

## 2020-07-01 DIAGNOSIS — J310 Chronic rhinitis: Secondary | ICD-10-CM | POA: Diagnosis not present

## 2020-07-01 DIAGNOSIS — I10 Essential (primary) hypertension: Secondary | ICD-10-CM

## 2020-07-01 MED ORDER — AMLODIPINE BESYLATE 5 MG PO TABS: 2.5000 mg | ORAL_TABLET | Freq: Every day | ORAL | 1 refills | Status: DC

## 2020-07-01 MED FILL — AMLODIPINE BESYLATE 5 MG TA: 5 | 60 days supply | Qty: 30 | Fill #0

## 2020-07-01 NOTE — Assessment & Plan Note (Signed)
Continue Atorvastatin 40 mg daily. Further recommendations according to LP results.

## 2020-07-01 NOTE — Patient Instructions (Addendum)
A few things to remember from today's visit:   Essential hypertension - Plan: Comprehensive metabolic panel, EKG 12-Lead, Lipid panel  Hyperlipidemia, unspecified hyperlipidemia type  Chest pressure - Plan: EKG 12-Lead  If you need refills please call your pharmacy. Do not use My Chart to request refills or for acute issues that need immediate attention.   Nasal saline irrigations and water based gel on nasal mucosa may help with nasal symptoms. Please arrange appt with ENT. I agree with you,chest pressure does not seems to be heart but we need to monitor closely for new symptoms. If persistent we may need to consider cardio evaluation. Blood pressure needs to be better controlled. I am adding Amlodipine 5 mg, start with 1/2 tab at bedtime. If blood pressure still 140/90 or higher in 2 weeks you can increase dose to 5 mg. No changes in Hyzaar. Monitor blood pressure.  Please be sure medication list is accurate. If a new problem present, please set up appointment sooner than planned today.

## 2020-07-01 NOTE — Assessment & Plan Note (Signed)
Re-checked 160/80. We discussed possible complications of elevated BP. She agrees with adding Amlodipine 5 mg to start with 1/2 tab at bedtime.Dose of Amlodipine can be increased to 5 mg in 2 weeks of BP is not < 140/90. No changes in dose of Losartan-HCTZ. Low salt diet. Monitor BP regularly.

## 2020-07-01 NOTE — Progress Notes (Signed)
HPI:  [name] is a pleasant 60 y.o. female, who is here today for follow up.   She was last seen on 12/03/19. Since her last visit ,her husband has died,39 years of marriage. He had stage colon cancer. Trying to go back to her normal routine, including sleep. 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 She is back to work and this has helped. She feels like she is dealing well with her lost, has moments of sadness.  She has not started counseling through hospice yet.  HTN: She resumed medication about a month ago. She is on Losartan-HCTZ 50-12.5 mg daily. She is not checking BP at home. Negative for severe/frequent headache, visual changes, exertional chest pain, dyspnea, palpitation, focal weakness, or edema.  Lab Results  Component Value Date   CREATININE 0.82 12/03/2019   BUN 14 12/03/2019   NA 143 12/03/2019   K 3.4 (L) 12/03/2019   CL 107 12/03/2019   CO2 21 12/03/2019   "Sometiems" mid chest pressure like sensation,intermittently since her husband died and usually when she is upset. Not radiated, usually at rest. No associated symptoms. Denies symptoms with activities like walking a hill,going up stares,or with heavy lifting. Hx of GERD, she has not noted heartburn.  Occasionally she also feels nauseated and feels "weak" sometimes.She attributes these to dehydration, she does not think she is drinking enough fluids. Anxiety: She is on Sertraline 50 mg daily.  Since her last visit she has seen ENT. According to pt, she has a small septal perforation. Still having dry nose, occasional nose bleed and gets dry in nose passages and aggravate problem. She was supposed to follow in 03/2020 but had to cancel appt.  HLD: She is on Atorvastatin 40 mg daily, dose increased last visit. She is tolerating medication well.  Lab Results  Component Value Date   CHOL 269 (H) 12/03/2019   HDL 80 12/03/2019   LDLCALC 169 (H) 12/03/2019   LDLDIRECT 165.0 02/26/2013    TRIG 94 12/03/2019   CHOLHDL 3.4 12/03/2019   Review of Systems  Constitutional: Positive for fatigue. Negative for activity change, appetite change and fever.  HENT: Negative for mouth sores and sore throat.   Respiratory: Negative for cough and wheezing.   Gastrointestinal: Negative for abdominal pain and vomiting.       Negative for changes in bowel habits.  Genitourinary: Negative for decreased urine volume, dysuria and hematuria.  Allergic/Immunologic: Positive for environmental allergies.  Neurological: Negative for syncope, facial asymmetry and weakness.  Psychiatric/Behavioral: Negative for confusion and hallucinations.  Rest of ROS, see pertinent positives sand negatives in HPI  Current Outpatient Medications on File Prior to Visit  Medication Sig Dispense Refill  . diclofenac sodium (VOLTAREN) 1 % GEL Apply 4 g topically 4 (four) times daily. 150 g 1  . losartan-hydrochlorothiazide (HYZAAR) 50-12.5 MG tablet Take 1 tablet by mouth daily. 90 tablet 2  . mometasone (NASONEX) 50 MCG/ACT nasal spray PLACE 2 SPRAYS INTO THE NOSE DAILY. 17 g 0  . valACYclovir (VALTREX) 1000 MG tablet TAKE 2 TABLETS BY MOUTH TWICE DAILY FOR 1 DAY. TAKE AT FIST SIGNS OF OUTBREAK. 30 tablet 1  . atorvastatin (LIPITOR) 40 MG tablet TAKE 1 TABLET (40 MG TOTAL) BY MOUTH DAILY. 90 tablet 3  . fluticasone (FLONASE) 50 MCG/ACT nasal spray PLACE 2 SPRAYS IN EACH NOSTRIL DAILY 16 g 6  . predniSONE (DELTASONE) 10 MG tablet TAKE 6 TABLETS BY MOUTH DAILY FOR 4 DAYS,THEN 4 TABS DAILY FOR 4  DAYS,THEN 2 TABS DAILY FOR 4 DAYS,THEN STOP 48 tablet 0  . sertraline (ZOLOFT) 50 MG tablet TAKE 1 TABLET BY MOUTH ONCE DAILY. 90 tablet 2   No current facility-administered medications on file prior to visit.   Past Medical History:  Diagnosis Date  . Acid reflux   . Acute lateral meniscus tear of right knee   . Acute medial meniscus tear of right knee   . Anxiety   . Blood in stool   . GERD (gastroesophageal reflux  disease)   . History of kidney stones   . Hypercholesteremia   . Hyperlipidemia   . Hypertension   . Hypertension    Allergies  Allergen Reactions  . Apple Anaphylaxis  . Latex Anaphylaxis    Latex condom  . Chlorhexidine Gluconate Itching  . Flounder [Fish Allergy] Swelling  . Other Swelling  . Oxycodone-Acetaminophen Other (See Comments)    "knocked me out" Other reaction(s): Other (See Comments) "knocked me out"  . Peanut Butter Flavor Itching    Social History   Socioeconomic History  . Marital status: Married    Spouse name: Not on file  . Number of children: Not on file  . Years of education: Not on file  . Highest education level: Not on file  Occupational History  . Not on file  Tobacco Use  . Smoking status: Never Smoker  . Smokeless tobacco: Never Used  Substance and Sexual Activity  . Alcohol use: Yes    Comment: social  . Drug use: No  . Sexual activity: Not on file  Other Topics Concern  . Not on file  Social History Narrative  . Not on file   Social Determinants of Health   Financial Resource Strain: Not on file  Food Insecurity: Not on file  Transportation Needs: Not on file  Physical Activity: Not on file  Stress: Not on file  Social Connections: Not on file   Vitals:   07/01/20 1421  BP: (!) 144/80  Pulse: 66  Resp: 12  SpO2: 99%   Body mass index is 24.94 kg/m.  Physical Exam Vitals and nursing note reviewed.  Constitutional:      General: She is not in acute distress.    Appearance: She is well-developed.  HENT:     Head: Normocephalic and atraumatic.  Eyes:     Conjunctiva/sclera: Conjunctivae normal.     Pupils: Pupils are equal, round, and reactive to light.  Cardiovascular:     Rate and Rhythm: Normal rate and regular rhythm.     Pulses:          Dorsalis pedis pulses are 2+ on the right side and 2+ on the left side.     Heart sounds: No murmur heard.   Pulmonary:     Effort: Pulmonary effort is normal. No  respiratory distress.     Breath sounds: Normal breath sounds.  Abdominal:     Palpations: Abdomen is soft. There is no hepatomegaly or mass.     Tenderness: There is no abdominal tenderness.  Lymphadenopathy:     Cervical: No cervical adenopathy.  Skin:    General: Skin is warm.     Findings: No erythema or rash.  Neurological:     Mental Status: She is alert and oriented to person, place, and time.     Cranial Nerves: No cranial nerve deficit.     Gait: Gait normal.  Psychiatric:     Comments: Well groomed, good eye contact.   ASSESSMENT  AND PLAN:   Ms. Alice Ryan was seen today for 6 months follow-up.  Orders Placed This Encounter  Procedures  . Comprehensive metabolic panel  . Lipid panel  . EKG 12-Lead   Lab Results  Component Value Date   CREATININE 0.79 07/01/2020   BUN 14 07/01/2020   NA 143 07/01/2020   K 3.5 07/01/2020   CL 107 07/01/2020   CO2 28 07/01/2020   Lab Results  Component Value Date   CHOL 321 (H) 07/01/2020   HDL 94.50 07/01/2020   LDLCALC 214 (H) 07/01/2020   LDLDIRECT 165.0 02/26/2013   TRIG 64.0 07/01/2020   CHOLHDL 3 07/01/2020   Lab Results  Component Value Date   ALT 10 07/01/2020   AST 13 07/01/2020   ALKPHOS 88 07/01/2020   BILITOT 0.5 07/01/2020   Chest pressure We discussed possible etiologies. Hx does not suggest a serious process. We discussed the probability of this being caused by heart disease, it is low but never 0. EKG today: SR,normal axis and intervals,? LAE. Unspecific T wave abnormalities antero-lateral leads. Compared with EKG done on 12/11/2018 no significant changes. She agrees with holding on further testing, understands warning signs.  Chronic rhinitis Nasal saline irrigations and water-based gels on nasal mucosa (Ex [place]) may help. Intra-nasal steroids and OTC antihistaminics could aggravate problem. Follow up with ENT if needed.  Hyperlipidemia Continue Atorvastatin 40 mg daily. Further  recommendations according to LP results.  Essential hypertension Re-checked 160/80. We discussed possible complications of elevated BP. She agrees with adding Amlodipine 5 mg to start with 1/2 tab at bedtime.Dose of Amlodipine can be increased to 5 mg in 2 weeks of BP is not < 140/90. No changes in dose of Losartan-HCTZ. Low salt diet. Monitor BP regularly.   Return in about 6 weeks (around 08/12/2020) for HTN.  Vanna Shavers G. [place], MD  [facility] [place] office.   A few things to remember from today's visit:   Essential hypertension - Plan: Comprehensive metabolic panel, EKG 12-Lead, Lipid panel  Hyperlipidemia, unspecified hyperlipidemia type  Chest pressure - Plan: EKG 12-Lead  If you need refills please call your pharmacy. Do not use My Chart to request refills or for acute issues that need immediate attention.   Nasal saline irrigations and water based gel on nasal mucosa may help with nasal symptoms. Please arrange appt with ENT. I agree with you,chest pressure does not seems to be heart but we need to monitor closely for new symptoms. If persistent we may need to consider cardio evaluation. Blood pressure needs to be better controlled. I am adding Amlodipine 5 mg, start with 1/2 tab at bedtime. If blood pressure still 140/90 or higher in 2 weeks you can increase dose to 5 mg. No changes in Hyzaar. Monitor blood pressure.  Please be sure medication list is accurate. If a new problem present, please set up appointment sooner than planned today.

## 2020-07-02 LAB — COMPREHENSIVE METABOLIC PANEL
ALT: 10 U/L (ref 0–35)
AST: 13 U/L (ref 0–37)
Albumin: 4.3 g/dL (ref 3.5–5.2)
Alkaline Phosphatase: 88 U/L (ref 39–117)
BUN: 14 mg/dL (ref 6–23)
CO2: 28 mEq/L (ref 19–32)
Calcium: 9.3 mg/dL (ref 8.4–10.5)
Chloride: 107 mEq/L (ref 96–112)
Creatinine, Ser: 0.79 mg/dL (ref 0.40–1.20)
GFR: 82.7 mL/min (ref >60.00)
Glucose, Bld: 81 mg/dL (ref 70–99)
Potassium: 3.5 mEq/L (ref 3.5–5.1)
Sodium: 143 mEq/L (ref 135–145)
Total Bilirubin: 0.5 mg/dL (ref 0.2–1.2)
Total Protein: 7.1 g/dL (ref 6.0–8.3)

## 2020-07-02 LAB — LIPID PANEL
Cholesterol: 321 mg/dL — ABNORMAL HIGH (ref 0–200)
HDL: 94.5 mg/dL (ref >39.00)
LDL Cholesterol: 214 mg/dL — ABNORMAL HIGH (ref 0–99)
NonHDL: 226.49
Total CHOL/HDL Ratio: 3
Triglycerides: 64 mg/dL (ref 0.0–149.0)
VLDL: 12.8 mg/dL (ref 0.0–40.0)

## 2020-07-04 ENCOUNTER — Other Ambulatory Visit (HOSPITAL_COMMUNITY): Payer: Self-pay

## 2020-07-05 ENCOUNTER — Encounter: Payer: Self-pay | Admitting: Family Medicine

## 2020-07-05 MED ORDER — ATORVASTATIN CALCIUM 40 MG PO TABS: 40.0000 mg | ORAL_TABLET | Freq: Every day | ORAL | 2 refills | Status: DC

## 2020-07-06 ENCOUNTER — Other Ambulatory Visit (HOSPITAL_COMMUNITY): Payer: Self-pay

## 2020-07-06 MED ORDER — ATORVASTATIN CALCIUM 40 MG PO TABS
40.0000 mg | ORAL_TABLET | Freq: Every day | ORAL | 2 refills | Status: DC
Start: 2020-07-05 — End: 2020-08-27
  Filled 2020-07-06 – 2020-08-24 (×5): qty 90, 90d supply, fill #0

## 2020-07-07 ENCOUNTER — Other Ambulatory Visit (HOSPITAL_COMMUNITY): Payer: Self-pay

## 2020-07-08 ENCOUNTER — Other Ambulatory Visit (HOSPITAL_COMMUNITY): Payer: Self-pay

## 2020-08-24 ENCOUNTER — Encounter: Payer: Self-pay | Admitting: Family Medicine

## 2020-08-24 ENCOUNTER — Other Ambulatory Visit: Payer: Self-pay

## 2020-08-24 ENCOUNTER — Ambulatory Visit: Payer: 59 | Admitting: Family Medicine

## 2020-08-24 ENCOUNTER — Other Ambulatory Visit (HOSPITAL_COMMUNITY): Payer: Self-pay

## 2020-08-24 VITALS — BP 168/90 | HR 86 | Temp 98.4°F | Resp 16 | Ht 61.0 in | Wt 131.8 lb

## 2020-08-24 DIAGNOSIS — I10 Essential (primary) hypertension: Secondary | ICD-10-CM | POA: Diagnosis not present

## 2020-08-24 DIAGNOSIS — Z1211 Encounter for screening for malignant neoplasm of colon: Secondary | ICD-10-CM

## 2020-08-24 DIAGNOSIS — E785 Hyperlipidemia, unspecified: Secondary | ICD-10-CM | POA: Diagnosis not present

## 2020-08-24 DIAGNOSIS — F331 Major depressive disorder, recurrent, moderate: Secondary | ICD-10-CM | POA: Diagnosis not present

## 2020-08-24 MED ORDER — AMLODIPINE-OLMESARTAN 5-20 MG PO TABS
1.0000 | ORAL_TABLET | Freq: Every day | ORAL | 2 refills | Status: DC
  Filled 2020-08-24: qty 30, 30d supply, fill #0
  Filled 2020-09-28: qty 30, 30d supply, fill #1

## 2020-08-24 MED FILL — Sertraline HCl Tab 50 MG: ORAL | 90 days supply | Qty: 90 | Fill #0 | Status: AC

## 2020-08-24 NOTE — Assessment & Plan Note (Addendum)
She prefers to hold on rechecking FLP. She will try to take Atorvastatin 40 mg daily. Benefits of statins discussed.

## 2020-08-24 NOTE — Assessment & Plan Note (Addendum)
Problem is not well controlled. Possible complications of elevated BP discussed. Stressed the importance compliance with medications. We changes to combination tab, which may help with compliance, Amlodipine-Olmesartan 5-20 mg daily. Instructed about warning signs. Follow-up in 3 months,before if needed.Marland Kitchen

## 2020-08-24 NOTE — Patient Instructions (Addendum)
A few things to remember from today's visit:  Essential hypertension  Hyperlipidemia, unspecified hyperlipidemia type  Colon cancer screening - Plan: Cologuard  If you need refills please call your pharmacy. Do not use My Chart to request refills or for acute issues that need immediate attention.   Today we are changing your blood pressure medication to help with compliance. Amlodipine+Olmesartan 5-20 mg daily. Try to take Atorvastatin daily as well as Sertraline.  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 Please call to arrange appt with psychotherapist.  Please be sure medication list is accurate. If a new problem present, please set up appointment sooner than planned today.

## 2020-08-24 NOTE — Progress Notes (Signed)
Alice Ryan is a 60 y.o.female, who is here today to follow on HTN. Last follow up visit: 07/01/20, when Amlodipine 2.5 mg was added. Last time she took Amlodipine was 3 days ago. She is not taking Losartan-HCTZ since last visit.  No side effects reported.  She has not noted unusual headache, visual changes, exertional chest pain, dyspnea,  focal weakness, or edema. Occasional retro-ocular achy pain when BP is elevated.  Home BP readings: She is not checking BP.  Lab Results  Component Value Date   CREATININE 0.79 07/01/2020   BUN 14 07/01/2020   NA 143 07/01/2020   K 3.5 07/01/2020   CL 107 07/01/2020   CO2 28 07/01/2020   HLD: She is not taking Atorvastatin 40 mg. She is trying to follow a healthful diet.  Lab Results  Component Value Date   CHOL 321 (H) 07/01/2020   HDL 94.50 07/01/2020   LDLCALC 214 (H) 07/01/2020   LDLDIRECT 165.0 02/26/2013   TRIG 64.0 07/01/2020   CHOLHDL 3 07/01/2020   Anxiety and depression: She is not taking Sertraline 50 mg daily, she feels like medication helps when she does take it. No side effect reported. Crying spells when she remembers her husband. Feels guilty sometimes. Negative for suicidal thoughts.  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 She tried to arrange CBT , has not received a call back.  Depression screen [facility] 2/9 08/24/2020 08/07/2017 08/07/2017 05/11/2017  Decreased Interest 2 0 0 0  Down, Depressed, Hopeless 2 0 0 0  PHQ - 2 Score 4 0 0 0  Altered sleeping 2 0 - -  Tired, decreased energy 2 0 - -  Change in appetite 0 0 - -  Feeling bad or failure about yourself  0 0 - -  Trouble concentrating 3 0 - -  Moving slowly or fidgety/restless 3 0 - -  Suicidal thoughts 0 0 - -  PHQ-9 Score 14 0 - -  Difficult doing work/chores Somewhat difficult - - -   GAD 7 : Generalized Anxiety Score 08/24/2020  Nervous, Anxious, on Edge 3  Control/stop worrying 3   Worry too much - different things 3  Trouble relaxing 3  Restless 3  Easily annoyed or irritable 3  Afraid - awful might happen 3  Total GAD 7 Score 21  Anxiety Difficulty Somewhat difficult   Planning on family vacation in 11/2020, she is excited about this.  Review of Systems  Constitutional: Positive for fatigue. Negative for activity change, appetite change and fever.  HENT: Negative for mouth sores, nosebleeds and sore throat.   Respiratory: Negative for cough and wheezing.   Gastrointestinal: Negative for abdominal pain, nausea and vomiting.       Negative for changes in bowel habits.  Genitourinary: Negative for decreased urine volume and hematuria.  Neurological: Negative for syncope and facial asymmetry.  Psychiatric/Behavioral: Negative for confusion. The patient is nervous/anxious.   Rest see pertinent positives and negatives per HPI.  Current Outpatient Medications on File Prior to Visit  Medication Sig Dispense Refill  . atorvastatin (LIPITOR) 40 MG tablet Take 1 tablet (40 mg total) by mouth daily. 90 tablet 2  . diclofenac sodium (VOLTAREN) 1 % GEL Apply 4 g topically 4 (four) times daily. 150 g 1  . fluticasone (FLONASE) 50 MCG/ACT nasal spray PLACE 2 SPRAYS IN EACH NOSTRIL DAILY 16 g 6  . mometasone (NASONEX) 50 MCG/ACT nasal spray PLACE  2 SPRAYS INTO THE NOSE DAILY. 17 g 0  . sertraline (ZOLOFT) 50 MG tablet TAKE 1 TABLET BY MOUTH ONCE DAILY. 90 tablet 2  . valACYclovir (VALTREX) 1000 MG tablet TAKE 2 TABLETS BY MOUTH TWICE DAILY FOR 1 DAY. TAKE AT FIST SIGNS OF OUTBREAK. 30 tablet 1   No current facility-administered medications on file prior to visit.   Past Medical History:  Diagnosis Date  . Acid reflux   . Acute lateral meniscus tear of right knee   . Acute medial meniscus tear of right knee   . Anxiety   . Blood in stool   . GERD (gastroesophageal reflux disease)   . History of kidney stones   . Hypercholesteremia   . Hyperlipidemia   . Hypertension    . Hypertension     Allergies  Allergen Reactions  . Apple Anaphylaxis  . Latex Anaphylaxis    Latex condom  . Chlorhexidine Gluconate Itching  . Flounder [Fish Allergy] Swelling  . Other Swelling  . Oxycodone-Acetaminophen Other (See Comments)    "knocked me out" Other reaction(s): Other (See Comments) "knocked me out"  . Peanut Butter Flavor Itching    Social History   Socioeconomic History  . Marital status: Married    Spouse name: Not on file  . Number of children: Not on file  . Years of education: Not on file  . Highest education level: Not on file  Occupational History  . Not on file  Tobacco Use  . Smoking status: Never Smoker  . Smokeless tobacco: Never Used  Substance and Sexual Activity  . Alcohol use: Yes    Comment: social  . Drug use: No  . Sexual activity: Not on file  Other Topics Concern  . Not on file  Social History Narrative  . Not on file   Social Determinants of Health   Financial Resource Strain: Not on file  Food Insecurity: Not on file  Transportation Needs: Not on file  Physical Activity: Not on file  Stress: Not on file  Social Connections: Not on file   Vitals:   08/24/20 1006 08/24/20 1052  BP: (!) 170/86 (!) 168/90  Pulse: 86   Resp: 16   Temp: 98.4 F (36.9 C)   SpO2: 98%    Body mass index is 24.9 kg/m.  Physical Exam Vitals and nursing note reviewed.  Constitutional:      General: She is not in acute distress.    Appearance: She is well-developed.  HENT:     Head: Normocephalic and atraumatic.     Mouth/Throat:     Mouth: Mucous membranes are moist.     Pharynx: Oropharynx is clear.  Eyes:     Conjunctiva/sclera: Conjunctivae normal.  Cardiovascular:     Rate and Rhythm: Normal rate and regular rhythm.     Pulses:          Dorsalis pedis pulses are 2+ on the right side and 2+ on the left side.     Heart sounds: No murmur heard.   Pulmonary:     Effort: Pulmonary effort is normal. No respiratory  distress.     Breath sounds: Normal breath sounds.  Abdominal:     Palpations: Abdomen is soft. There is no hepatomegaly or mass.     Tenderness: There is no abdominal tenderness.  Lymphadenopathy:     Cervical: No cervical adenopathy.  Skin:    General: Skin is warm.     Findings: No erythema or rash.  Neurological:  Mental Status: She is alert and oriented to person, place, and time.     Cranial Nerves: No cranial nerve deficit.     Gait: Gait normal.     Deep Tendon Reflexes:     Reflex Scores:      Patellar reflexes are 2+ on the right side and 2+ on the left side. Psychiatric:        Mood and Affect: Affect is labile.        Thought Content: Thought content does not include suicidal ideation. Thought content does not include suicidal plan.     Comments: Well groomed, good eye contact.   ASSESSMENT AND PLAN:   AliceReine was seen today for follow-up and medication refill.  Diagnoses and all orders for this visit:  Hyperlipidemia She prefers to hold on rechecking FLP. She will try to take Atorvastatin 40 mg daily. Benefits of statins discussed.  Essential hypertension Problem is not well controlled. Possible complications of elevated BP discussed. Stressed the importance compliance with medications. We changes to combination tab, which may help with compliance, Amlodipine-Olmesartan 5-20 mg daily. Instructed about warning signs. Follow-up in 3 months,before if needed..   Colon cancer screening -     Cologuard  Depression, major, recurrent, moderate (HCC) Problem is not well controlled. She has tolerated Sertraline well, she will try to take medication daily. CBT will also help, she will try to call again and arrange appt.  Return in about 3 months (around 11/24/2020) for HTN,HLD,depression.   Alexiya Franqui G. [place], MD  [facility]. [place] office.  A few things to remember from today's visit:  Essential hypertension  Hyperlipidemia, unspecified  hyperlipidemia type  Colon cancer screening - Plan: Cologuard  If you need refills please call your pharmacy. Do not use My Chart to request refills or for acute issues that need immediate attention.   Today we are changing your blood pressure medication to help with compliance. Amlodipine+Olmesartan 5-20 mg daily. Try to take Atorvastatin daily as well as Sertraline.  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 Please call to arrange appt with psychotherapist.  Please be sure medication list is accurate. If a new problem present, please set up appointment sooner than planned today.

## 2020-08-27 DIAGNOSIS — F339 Major depressive disorder, recurrent, unspecified: Secondary | ICD-10-CM | POA: Insufficient documentation

## 2020-08-29 ENCOUNTER — Emergency Department (HOSPITAL_COMMUNITY)
Admission: EM | Admit: 2020-08-29 | Discharge: 2020-09-03 | Disposition: A | Discharge status: Home / Self Care | Payer: 59 | Attending: Emergency Medicine | Admitting: Emergency Medicine

## 2020-08-29 ENCOUNTER — Encounter (HOSPITAL_COMMUNITY): Payer: Self-pay | Admitting: Emergency Medicine

## 2020-08-29 ENCOUNTER — Other Ambulatory Visit: Payer: Self-pay

## 2020-08-29 DIAGNOSIS — I10 Essential (primary) hypertension: Secondary | ICD-10-CM | POA: Insufficient documentation

## 2020-08-29 DIAGNOSIS — R079 Chest pain, unspecified: Secondary | ICD-10-CM | POA: Insufficient documentation

## 2020-08-29 DIAGNOSIS — R0602 Shortness of breath: Secondary | ICD-10-CM | POA: Insufficient documentation

## 2020-08-29 DIAGNOSIS — Z5321 Procedure and treatment not carried out due to patient leaving prior to being seen by health care provider: Secondary | ICD-10-CM | POA: Diagnosis not present

## 2020-08-29 DIAGNOSIS — H5789 Other specified disorders of eye and adnexa: Secondary | ICD-10-CM | POA: Diagnosis not present

## 2020-08-29 DIAGNOSIS — R0789 Other chest pain: Secondary | ICD-10-CM | POA: Diagnosis not present

## 2020-08-29 LAB — CBC
HCT: 40.8 % (ref 36.0–46.0)
Hemoglobin: 12.9 g/dL (ref 12.0–15.0)
MCH: 27.4 pg (ref 26.0–34.0)
MCHC: 31.6 g/dL (ref 30.0–36.0)
MCV: 86.6 fL (ref 80.0–100.0)
Platelets: 367 10*3/uL (ref 150–400)
RBC: 4.71 MIL/uL (ref 3.87–5.11)
RDW: 14.1 % (ref 11.5–15.5)
WBC: 9.7 10*3/uL (ref 4.0–10.5)
nRBC: 0 % (ref 0.0–0.2)

## 2020-08-29 LAB — TROPONIN I (HIGH SENSITIVITY)
Troponin I (High Sensitivity): 7 ng/L (ref <18)
Troponin I (High Sensitivity): 7 ng/L (ref <18)

## 2020-08-29 LAB — BASIC METABOLIC PANEL
Anion gap: 10 (ref 5–15)
BUN: 16 mg/dL (ref 6–20)
CO2: 22 mmol/L (ref 22–32)
Calcium: 9.2 mg/dL (ref 8.9–10.3)
Chloride: 106 mmol/L (ref 98–111)
Creatinine, Ser: 0.71 mg/dL (ref 0.44–1.00)
GFR, Estimated: >60 mL/min (ref >60)
Glucose, Bld: 108 mg/dL — ABNORMAL HIGH (ref 70–99)
Potassium: 3 mmol/L — ABNORMAL LOW (ref 3.5–5.1)
Sodium: 138 mmol/L (ref 135–145)

## 2020-08-29 NOTE — ED Provider Notes (Signed)
Emergency Medicine Provider Triage Evaluation Note  Alice Ryan [facility] , a 60 y.o. female  was evaluated in triage.  Pt complains of right eye redness that she first noticed this evening at approximately 1:730.  She went and had it evaluated as she thought she may have pinkeye, and was told that it looked most consistent with a ruptured blood vessel in her eye.  She went and had her blood pressure checked earlier tonight and systolic pressure was 193.  She was concerned because she has a history of hypertension and blood pressure has been poorly controlled recently.  She was last seen by family medicine within the last week to have her home blood pressure medications readjusted.  She has been compliant with her new blood pressure medications over the last 3 days.  She does report that she has been having intermittent pressure-like chest pain for some time.  She will sometimes feel short of breath with it.  She states that she has been under an increased amount of stress for the last few months since her husband passed away from colorectal cancer in January.  She denies SI, HI, or auditory visual hallucinations.  She states that the chest pressure is more prominent when her blood pressure is elevated.  Today, she denies headache, eye pain, visual changes, diplopia, amaurosis fugax, neck pain, tinnitus, otalgia, URI symptoms, cough, nausea, vomiting, diarrhea, constipation, increased straining with bowel movements.   The patient does note that she had some intermittent confusion approximately 5 days ago.  On Sunday, she thought it was Monday and for several hours was confused about the day until her son finally corrected her.  No other episodes of recent confusion.  Review of Systems  Positive: Chest pain, eye redness Negative: Headache, visual changes, tinnitus, URI symptoms, nausea, vomiting, diarrhea, cough, constipation, amaurosis fugax, diplopia, shortness of breath  Physical Exam  BP (!) 165/68  (BP Location: Left Arm)   Pulse 92   Temp 98.4 F (36.9 C) (Oral)   Resp 16   LMP 09/02/2013   SpO2 100%  Gen:   Awake, no distress   Resp:  Normal effort  MSK:   Moves extremities without difficulty  Other:  Subconjunctival hemorrhage noted to the right eye.  No hyphema.  Extraocular movements are intact.  Pupils are equal round and reactive.  Cranial nerves II through XII are grossly intact.  5 of 5 strength against resistance of bilateral upper and lower extremities.  Sensation is intact and equal throughout.  Ambulates without ataxia.  Finger-nose is intact bilaterally.  GCS 15.  Alert and oriented x3.  Medical Decision Making  Medically screening exam initiated at 3:24 AM.  Appropriate orders placed.  Alice Ryan was informed that the remainder of the evaluation will be completed by another provider, this initial triage assessment does not replace that evaluation, and the importance of remaining in the ED until their evaluation is complete.  Patient presenting with subconjunctival hemorrhage on the right eye.  She has no associated headache, pain, visual changes, double vision, amaurosis fugax, tinnitus, or other neurologic complaints at this time.  No indication for head CT at this time.  However, she has been having intermittent chest pain with associated elevated blood pressure.  We will check screening labs, EKG, and troponin.  She will require further work-up and evaluation in the emergency department.   Frederik Pear A, PA-C 08/29/20 0425    Shon Baton, MD 08/29/20 2350

## 2020-08-29 NOTE — ED Triage Notes (Signed)
Patient reports elevated blood pressure this evening 193 systolic , patient added right eye blood shot this afternoon , denies eye injury or blurred vision .

## 2020-09-03 ENCOUNTER — Telehealth (INDEPENDENT_AMBULATORY_CARE_PROVIDER_SITE_OTHER): Payer: 59 | Admitting: Family Medicine

## 2020-09-03 ENCOUNTER — Other Ambulatory Visit (HOSPITAL_COMMUNITY): Payer: Self-pay

## 2020-09-03 DIAGNOSIS — I10 Essential (primary) hypertension: Secondary | ICD-10-CM

## 2020-09-03 DIAGNOSIS — R21 Rash and other nonspecific skin eruption: Secondary | ICD-10-CM

## 2020-09-03 MED ORDER — TRIAMCINOLONE ACETONIDE 0.1 % EX CREA
1.0000 "application " | TOPICAL_CREAM | Freq: Two times a day (BID) | CUTANEOUS | 0 refills | Status: DC
  Filled 2020-09-03: qty 30, 15d supply, fill #0

## 2020-09-03 NOTE — Progress Notes (Signed)
Virtual Visit via Video Note  I connected with Alice Ryan  on 09/03/20 at 10:20 AM EDT by a video enabled telemedicine application and verified that I am speaking with the correct person using two identifiers.  Location patient: home, [place] Location provider:work or home office Persons participating in the virtual visit: patient, provider  I discussed the limitations of evaluation and management by telemedicine and the availability of in person appointments. The patient expressed understanding and agreed to proceed.   HPI:  Acute telemedicine visit for follow up HTN: -has been seeing PCP lately for elevated BP and medications were changed last week and now that she is taking her medication daily it has come down quite a bit (azor 5-20mg  once daily) -reports was seen in [facility]/ER last week for a red eye - she thinks was from working in trees - ER thought was related to BP - now resolved -BP today 133/53 -Denies: CP, SOB, HA, vision changes, swelling -was told to follow up with PCP -has follow up with PCP next week  Has another issues of Rash: -on arms and a patch on neck -from working in the yard -itchy - maybe a little vesicular -has had this in the past and used a steroid cream she thinks -denies SOB, swelling of face/throat/tongue, lesions near eye or mucus membranes   ROS: See pertinent positives and negatives per HPI.  Past Medical History:  Diagnosis Date  . Acid reflux   . Acute lateral meniscus tear of right knee   . Acute medial meniscus tear of right knee   . Anxiety   . Blood in stool   . GERD (gastroesophageal reflux disease)   . History of kidney stones   . Hypercholesteremia   . Hyperlipidemia   . Hypertension   . Hypertension     Past Surgical History:  Procedure Laterality Date  . BREAST EXCISIONAL BIOPSY Left 2011   benign  . BREAST LUMPECTOMY WITH RADIOACTIVE SEED LOCALIZATION Right 12/13/2018   Procedure: RIGHT BREAST LUMPECTOMY WITH RADIOACTIVE SEED  LOCALIZATION;  Surgeon: Harriette Bouillon, MD;  Location: [facility];  Service: General;  Laterality: Right;  . CHONDROPLASTY Right 06/25/2019   Procedure: CHONDROPLASTY;  Surgeon: Salvatore Marvel, MD;  Location: [facility];  Service: Orthopedics;  Laterality: Right;  . CYST EXCISION PERINEAL N/A 06/13/2016   Procedure: CYST EXCISION PERIANAL, CHRONIC INFECTED;  Surgeon: Jimmye Norman, MD;  Location: [facility];  Service: General;  Laterality: N/A;  . EVALUATION UNDER ANESTHESIA WITH HEMORRHOIDECTOMY N/A 06/13/2016   Procedure: EXAM UNDER ANESTHESIA,  HEMORRHOIDECTOMY;  Surgeon: Jimmye Norman, MD;  Location: [facility];  Service: General;  Laterality: N/A;  . KNEE ARTHROSCOPY WITH MEDIAL MENISECTOMY Right 06/25/2019   Procedure: KNEE ARTHROSCOPY WITH MEDIAL MENISECTOMY;  Surgeon: Salvatore Marvel, MD;  Location: [facility];  Service: Orthopedics;  Laterality: Right;  . lumpectomy right breast  2011   benign  . TRIGGER FINGER RELEASE Right 07/31/2017   Procedure: RELEASE TRIGGER FINGER/A-1 PULLEY RIGHT RING FINGER;  Surgeon: Betha Loa, MD;  Location: [facility];  Service: Orthopedics;  Laterality: Right;     Current Outpatient Medications:  .  triamcinolone cream (KENALOG) 0.1 %, Apply 1 application topically 2 (two) times daily., Disp: 30 g, Rfl: 0 .  amLODipine-olmesartan (AZOR) 5-20 MG tablet, Take 1 tablet by mouth daily., Disp: 30 tablet, Rfl: 2 .  atorvastatin (LIPITOR) 40 MG tablet, Take 1 tablet (40 mg total) by mouth daily., Disp: 90  tablet, Rfl: 2 .  diclofenac sodium (VOLTAREN) 1 % GEL, Apply 4 g topically 4 (four) times daily., Disp: 150 g, Rfl: 1 .  fluticasone (FLONASE) 50 MCG/ACT nasal spray, PLACE 2 SPRAYS IN EACH NOSTRIL DAILY, Disp: 16 g, Rfl: 6 .  mometasone (NASONEX) 50 MCG/ACT nasal spray, PLACE 2 SPRAYS INTO THE NOSE DAILY., Disp: 17 g, Rfl: 0 .  sertraline (ZOLOFT) 50 MG tablet, TAKE 1 TABLET  BY MOUTH ONCE DAILY. (Patient taking differently: Take 50 mg by mouth daily.), Disp: 90 tablet, Rfl: 2 .  valACYclovir (VALTREX) 1000 MG tablet, TAKE 2 TABLETS BY MOUTH TWICE DAILY FOR 1 DAY. TAKE AT FIST SIGNS OF OUTBREAK., Disp: 30 tablet, Rfl: 1  EXAM:  VITALS per patient if applicable:  GENERAL: alert, oriented, appears well and in no acute distress  HEENT: atraumatic, conjunttiva clear, no obvious abnormalities on inspection of external nose and ears  NECK: normal movements of the head and neck  LUNGS: on inspection no signs of respiratory distress, breathing rate appears normal, no obvious gross SOB, gasping or wheezing  CV: no obvious cyanosis  MS: moves all visible extremities without noticeable abnormality  PSYCH/NEURO: pleasant and cooperative, no obvious depression or anxiety, speech and thought processing grossly intact  ASSESSMENT AND PLAN:  Discussed the following assessment and plan:  Hypertension, unspecified type -Continue current medications -Also discussed various different diets for hypertension and lifestyle changes she could make to improve her blood pressure and health as well (discussed the DASH diet, Mediterranean diet and Whole Foods plant-based diet) -Monitor blood pressure at home, discussed goals -Keep follow-up with primary care office  Rash -Query Toxicodendron dermatitis versus other -She opted to try topical steroid cream, did discuss risks of hypopigmentation, opted for twice daily for 1 week followed by once daily for 1 week -Advised prompt in person evaluation if the rash spreads to the face near mucous membranes of the eye, is worsening or develops other symptoms  Scheduled follow up with PCP offered: She has follow-up scheduled with her primary care doctor next week per her report.  Advised to keep this appointment. Advised to seek prompt in person care if worsening, new symptoms arise, or if is not improving with treatment. Discussed options  for inperson care if PCP office not available. Did let this patient know that I only do telemedicine on Tuesdays and Thursdays for [place]. Advised to schedule follow up visit with PCP or [facility] if any further questions or concerns to avoid delays in care.   I discussed the assessment and treatment plan with the patient. The patient was provided an opportunity to ask questions and all were answered. The patient agreed with the plan and demonstrated an understanding of the instructions.     Terressa Koyanagi, DO

## 2020-09-03 NOTE — Patient Instructions (Addendum)
-  I sent the medication(s) we discussed to your pharmacy: Meds ordered this encounter  Medications  . triamcinolone cream (KENALOG) 0.1 %    Sig: Apply 1 application topically 2 (two) times daily.    Dispense:  30 g    Refill:  0   -Check blood pressure once daily at different times the day, sit 5 minutes before checking, arm rested on a table or counter.  Keep a log of your blood pressures to bring to your next appointment.  The goal blood pressure is 120/70.  If your blood pressure is running high, or you are not feeling well, please contact your primary care office sooner.  -For the rash, apply the triamcinolone cream twice daily for 1 week, followed by once daily for 1 week.  If the rash is spreading or worsening, is on your face.  Your mucous membranes or eyes, or you develop other symptoms, please seek prompt in person evaluation.  I hope you are feeling better soon!  See your primary care doctor for follow-up next week.  Seek in person care promptly if your symptoms worsen, new concerns arise or you are not improving with treatment.  It was nice to meet you today. I help [facility] out with telemedicine visits on Tuesdays and Thursdays and am available for visits on those days. If you have any concerns or questions following this visit please schedule a follow up visit with your Primary Care doctor or seek care at a local urgent care clinic to avoid delays in care.

## 2020-09-08 ENCOUNTER — Ambulatory Visit: Payer: 59 | Admitting: Family Medicine

## 2020-09-22 DIAGNOSIS — Z1211 Encounter for screening for malignant neoplasm of colon: Secondary | ICD-10-CM | POA: Diagnosis not present

## 2020-09-27 LAB — COLOGUARD: COLOGUARD: NEGATIVE

## 2020-09-28 ENCOUNTER — Other Ambulatory Visit (HOSPITAL_COMMUNITY): Payer: Self-pay

## 2020-09-28 MED FILL — Fluticasone Propionate Nasal Susp 50 MCG/ACT: NASAL | 30 days supply | Qty: 16 | Fill #0 | Status: AC

## 2020-10-26 ENCOUNTER — Encounter: Payer: Self-pay | Admitting: Family Medicine

## 2020-11-26 ENCOUNTER — Other Ambulatory Visit (HOSPITAL_COMMUNITY): Payer: Self-pay

## 2020-11-26 MED ORDER — CARESTART COVID-19 HOME TEST VI KIT
PACK | 0 refills | Status: DC
Start: 2020-11-26 — End: 2020-12-11
  Filled 2020-11-26: qty 2, 2d supply, fill #0

## 2020-11-27 ENCOUNTER — Telehealth (INDEPENDENT_AMBULATORY_CARE_PROVIDER_SITE_OTHER): Payer: 59 | Admitting: Family Medicine

## 2020-11-27 ENCOUNTER — Other Ambulatory Visit (HOSPITAL_COMMUNITY): Payer: Self-pay

## 2020-11-27 ENCOUNTER — Encounter: Payer: Self-pay | Admitting: Family Medicine

## 2020-11-27 ENCOUNTER — Telehealth: Payer: Self-pay | Admitting: Family Medicine

## 2020-11-27 VITALS — BP 125/54 | Ht 61.0 in | Wt 131.8 lb

## 2020-11-27 DIAGNOSIS — R059 Cough, unspecified: Secondary | ICD-10-CM

## 2020-11-27 DIAGNOSIS — U071 COVID-19: Secondary | ICD-10-CM | POA: Diagnosis not present

## 2020-11-27 DIAGNOSIS — E876 Hypokalemia: Secondary | ICD-10-CM | POA: Diagnosis not present

## 2020-11-27 MED ORDER — BENZONATATE 100 MG PO CAPS
200.0000 mg | ORAL_CAPSULE | Freq: Two times a day (BID) | ORAL | 0 refills | Status: AC | PRN
  Filled 2020-11-27: qty 30, 8d supply, fill #0

## 2020-11-27 MED ORDER — NIRMATRELVIR/RITONAVIR (PAXLOVID)TABLET
3.0000 | ORAL_TABLET | Freq: Two times a day (BID) | ORAL | 0 refills | Status: AC
  Filled 2020-11-27: qty 30, 5d supply, fill #0

## 2020-11-27 NOTE — Telephone Encounter (Signed)
Patient called to see if she could be prescribed something for covid. Patient tested positive for covid today 11/27/2020. Patient is scheduled for virtual appointment on Monday.  Good callback number is [phone]   [facility] Advise

## 2020-11-27 NOTE — Telephone Encounter (Signed)
Virtual visit scheduled for 08/26

## 2020-11-27 NOTE — Telephone Encounter (Signed)
Please add her to my schedule for video/virtual visit. Thanks, BJ

## 2020-11-27 NOTE — Progress Notes (Signed)
Virtual Visit via Video Note I connected with Alice Ryan on 11/27/20 by a video enabled telemedicine application and verified that I am speaking with the correct person using two identifiers.  Location patient: home Location provider:work office Persons participating in the virtual visit: patient, provider  I discussed the limitations of evaluation and management by telemedicine and the availability of in person appointments. The patient expressed understanding and agreed to proceed.  Chief Complaint  Patient presents with   Covid Positive    Patient tested positive for Covid-19, x1 day, Patient tested positive with PCR test    Cough    Patient complains of cough, Productive cough, x3 days, Tried Cough suppressants with little relief.   Nasal Congestion    Patient complains of Nasal congestion, x3 days, Tried cough suppressants with little relief    Fatigue    Patient complains of fatigue, x3 days, Tried cough suppressants with little relief   Sore Throat    Patient complains of sore throat, x3 days, Tried cough suppressants with little relief   Chills    Patient complains of chills, x3 days    HPI: Alice Ryan is a 60 yo female with hx of hypertension, hyperlipidemia, anxiety, allergic rhinitis,and GERD complaining of 3 days of respiratory symptoms as described above. Symptoms started with sore throat, which has improved. Nasal congestion and rhinorrhea seem to be at her baseline. Headache has improved.  Productive cough with clearish sputum. Negative for fever, anosmia, ageusia, wheezing, CP, dyspnea, abdominal pain, N/V, changes in bowel habits, urinary symptoms, or skin rash.  COVID-19 test done through her employer, [employer], was positive. No known sick contact, she recently went to [place] with her family.  Her son and daughter-in-law has been sick, they have not been screened for COVID-19 infection yet.  She presented to the ER on 08/29/20, left before she was seen.  Left subconjunctival hemorrhage x 2, she has been evaluated by eye care provider, SBP was elevated at 193. No visual changes or eye pain.  HTN: She is taking Amlodipine-olmesartan 5-20 mg daily. BP at home has been "good."  Lab Results  Component Value Date   CREATININE 0.71 08/29/2020   BUN 16 08/29/2020   NA 138 08/29/2020   K 3.0 (L) 08/29/2020   CL 106 08/29/2020   CO2 22 08/29/2020   ROS: See pertinent positives and negatives per HPI.  Past Medical History:  Diagnosis Date   Acid reflux    Acute lateral meniscus tear of right knee    Acute medial meniscus tear of right knee    Anxiety    Blood in stool    GERD (gastroesophageal reflux disease)    History of kidney stones    Hypercholesteremia    Hyperlipidemia    Hypertension    Hypertension     Past Surgical History:  Procedure Laterality Date   BREAST EXCISIONAL BIOPSY Left 2011   benign   BREAST LUMPECTOMY WITH RADIOACTIVE SEED LOCALIZATION Right 12/13/2018   Procedure: RIGHT BREAST LUMPECTOMY WITH RADIOACTIVE SEED LOCALIZATION;  Surgeon: Erroll Luna, MD;  Location: [place];  Service: General;  Laterality: Right;   CHONDROPLASTY Right 06/25/2019   Procedure: CHONDROPLASTY;  Surgeon: Elsie Saas, MD;  Location: [facility];  Service: Orthopedics;  Laterality: Right;   CYST EXCISION PERINEAL N/A 06/13/2016   Procedure: CYST EXCISION PERIANAL, CHRONIC INFECTED;  Surgeon: Judeth Horn, MD;  Location: [facility];  Service: General;  Laterality: N/A;   EVALUATION UNDER  ANESTHESIA WITH HEMORRHOIDECTOMY N/A 06/13/2016   Procedure: EXAM UNDER ANESTHESIA,  HEMORRHOIDECTOMY;  Surgeon: Judeth Horn, MD;  Location: [place];  Service: General;  Laterality: N/A;   KNEE ARTHROSCOPY WITH MEDIAL MENISECTOMY Right 06/25/2019   Procedure: KNEE ARTHROSCOPY WITH MEDIAL MENISECTOMY;  Surgeon: Elsie Saas, MD;  Location: [place];  Service: Orthopedics;   Laterality: Right;   lumpectomy right breast  2011   benign   TRIGGER FINGER RELEASE Right 07/31/2017   Procedure: RELEASE TRIGGER FINGER/A-1 PULLEY RIGHT RING FINGER;  Surgeon: Leanora Cover, MD;  Location: [facility];  Service: Orthopedics;  Laterality: Right;   Family History  Problem Relation Age of Onset   Cancer Mother        breast cancer at age 27   Stroke Mother    Hypertension Mother    Breast cancer Mother 16   Hypertension Father    Social History   Socioeconomic History   Marital status: Married    Spouse name: Not on file   Number of children: Not on file   Years of education: Not on file   Highest education level: Not on file  Occupational History   Not on file  Tobacco Use   Smoking status: Never   Smokeless tobacco: Never  Substance and Sexual Activity   Alcohol use: Yes    Comment: social   Drug use: No   Sexual activity: Not on file  Other Topics Concern   Not on file  Social History Narrative   Not on file   Social Determinants of Health   Financial Resource Strain: Not on file  Food Insecurity: Not on file  Transportation Needs: Not on file  Physical Activity: Not on file  Stress: Not on file  Social Connections: Not on file  Intimate Partner Violence: Not on file   Current Outpatient Medications:    amLODipine-olmesartan (AZOR) 5-20 MG tablet, Take 1 tablet by mouth daily., Disp: 30 tablet, Rfl: 2   atorvastatin (LIPITOR) 40 MG tablet, Take 1 tablet (40 mg total) by mouth daily., Disp: 90 tablet, Rfl: 2   COVID-19 At Home Antigen Test (CARESTART COVID-19 HOME TEST) KIT, Use as directed, Disp: 2 each, Rfl: 0   diclofenac sodium (VOLTAREN) 1 % GEL, Apply 4 g topically 4 (four) times daily., Disp: 150 g, Rfl: 1   fluticasone (FLONASE) 50 MCG/ACT nasal spray, PLACE 2 SPRAYS IN EACH NOSTRIL DAILY, Disp: 16 g, Rfl: 6   mometasone (NASONEX) 50 MCG/ACT nasal spray, PLACE 2 SPRAYS INTO THE NOSE DAILY., Disp: 17 g, Rfl: 0   sertraline  (ZOLOFT) 50 MG tablet, TAKE 1 TABLET BY MOUTH ONCE DAILY. (Patient taking differently: Take 50 mg by mouth daily.), Disp: 90 tablet, Rfl: 2   triamcinolone cream (KENALOG) 0.1 %, Apply 1 application topically 2 (two) times daily., Disp: 30 g, Rfl: 0   valACYclovir (VALTREX) 1000 MG tablet, TAKE 2 TABLETS BY MOUTH TWICE DAILY FOR 1 DAY. TAKE AT FIST SIGNS OF OUTBREAK., Disp: 30 tablet, Rfl: 1  EXAM:  VITALS per patient if applicable:BP (!) 607/37   Ht $R'5\' 1"'QS$  (1.549 m)   Wt 131 lb 12.8 oz (59.8 kg)   LMP 09/02/2013   BMI 24.90 kg/m   GENERAL: alert, oriented, appears well and in no acute distress  HEENT: atraumatic, conjunctiva clear, no obvious abnormalities on inspection of external nose and ears  NECK: normal movements of the head and neck  LUNGS: on inspection no signs of respiratory distress,  breathing rate appears normal, no obvious gross SOB, gasping or wheezing. Non productive cough a few times during visit.  CV: no obvious cyanosis  MS: moves all visible extremities without noticeable abnormality  PSYCH/NEURO: pleasant and cooperative, no obvious depression or anxiety, speech and thought processing grossly intact  ASSESSMENT AND PLAN:  Discussed the following assessment and plan:  COVID-19 virus infection - Plan: nirmatrelvir/ritonavir EUA (PAXLOVID) 20 x 150 MG & 10 x 100MG TABS We discussed Dx,possible complications and treatment options. She has a mild case with low to moderate risk for complications. We discussed oral antiviral options and side effects. She agrees with trying Paxlovid. Symptomatic treatment with plenty of fluids,rest,tylenol 500 mg 3-4 times per day prn.  Throat lozenges if needed for sore throat. Clearly instructed about warning signs.  Cough - Plan: benzonatate (TESSALON) 100 MG capsule Explained that cough and congestion can last a few days and even weeks after acute symptoms have resolved. Adequate hydration. I do not think imaging is needed  at this time. Symptomatic treatment with benzonatate.  Hypokalemia Potassium rich diet. We will plan on checking BMP next visit.  Continue monitoring BP regularly.  We discussed possible serious and likely etiologies, options for evaluation and workup, limitations of telemedicine visit vs in person visit, treatment, treatment risks and precautions.  I discussed the assessment and treatment plan with the patient. Alice Ryan was provided an opportunity to ask questions and all were answered. She agreed with the plan and demonstrated an understanding of the instructions.  Return if symptoms worsen or fail to improve, for Keep next appt..  Amer Alcindor Martin[place], MD

## 2020-11-30 ENCOUNTER — Telehealth: Payer: 59 | Admitting: Family Medicine

## 2020-12-01 ENCOUNTER — Ambulatory Visit: Payer: 59 | Admitting: Family Medicine

## 2020-12-10 ENCOUNTER — Other Ambulatory Visit: Payer: Self-pay

## 2020-12-10 NOTE — Progress Notes (Signed)
HPI:  Alice Ryan is a 60 y.o. female, who is here today for 4 months follow up.   She was last seen on 11/27/2020, virtual visit due to COVID-19 infection. Hypertension:  Medications: Amlodipine-olmesartan 5-20 mg daily.  Compliance has improved. BP readings at home:120's/50's. Side effects: None Negative for unusual or severe headache, visual changes, exertional chest pain, dyspnea,  focal weakness, or edema.  Lab Results  Component Value Date   CREATININE 0.71 08/29/2020   BUN 16 08/29/2020   NA 138 08/29/2020   K 3.0 (L) 08/29/2020   CL 106 08/29/2020   CO2 22 08/29/2020   HLD: She is on Atorvastatin 40 mg daily. Tolerating medication well. Lab Results  Component Value Date   CHOL 321 (H) 07/01/2020   HDL 94.50 07/01/2020   LDLCALC 214 (H) 07/01/2020   LDLDIRECT 165.0 02/26/2013   TRIG 64.0 07/01/2020   CHOLHDL 3 07/01/2020   Anxiety: She discontinued Sertraline when she was dx'ed with COVID 19 infection because was told to do so by pharmacist due to risk of interaction with Paxlovid. She has noted that medication helps with irritation. No side effects.  Lab Results  Component Value Date   WBC 9.7 08/29/2020   HGB 12.9 08/29/2020   HCT 40.8 08/29/2020   MCV 86.6 08/29/2020   PLT 367 08/29/2020   Still having productive cough after COVID. No associated SOB or wheezing. Cough drops and Benzonatate help. It is worse at work. Gradually improving. Problem is not interfering with sleep. Negative for fever,chills,and body aches.  Review of Systems  Constitutional:  Positive for fatigue. Negative for appetite change.  HENT:  Positive for postnasal drip. Negative for mouth sores, nosebleeds and sore throat.   Gastrointestinal:  Negative for abdominal pain, nausea and vomiting.       Negative for changes in bowel habits.  Genitourinary:  Negative for decreased urine volume and hematuria.  Skin:  Negative for pallor and rash.  Neurological:   Negative for syncope, facial asymmetry and weakness.  Hematological:  Negative for adenopathy. Does not bruise/bleed easily.  Psychiatric/Behavioral:  Negative for confusion.   Rest of ROS, see pertinent positives sand negatives in HPI  Current Outpatient Medications on File Prior to Visit  Medication Sig Dispense Refill   amLODipine-olmesartan (AZOR) 5-20 MG tablet Take 1 tablet by mouth daily. 30 tablet 2   atorvastatin (LIPITOR) 40 MG tablet Take 1 tablet (40 mg total) by mouth daily. 90 tablet 2   diclofenac sodium (VOLTAREN) 1 % GEL Apply 4 g topically 4 (four) times daily. 150 g 1   fluticasone (FLONASE) 50 MCG/ACT nasal spray PLACE 2 SPRAYS IN EACH NOSTRIL DAILY 16 g 6   mometasone (NASONEX) 50 MCG/ACT nasal spray PLACE 2 SPRAYS INTO THE NOSE DAILY. 17 g 0   triamcinolone cream (KENALOG) 0.1 % Apply 1 application topically 2 (two) times daily. 30 g 0   valACYclovir (VALTREX) 1000 MG tablet TAKE 2 TABLETS BY MOUTH TWICE DAILY FOR 1 DAY. TAKE AT FIST SIGNS OF OUTBREAK. 30 tablet 1   sertraline (ZOLOFT) 50 MG tablet TAKE 1 TABLET BY MOUTH ONCE DAILY. (Patient taking differently: Take 50 mg by mouth daily.) 90 tablet 2   No current facility-administered medications on file prior to visit.   Past Medical History:  Diagnosis Date   Acid reflux    Acute lateral meniscus tear of right knee    Acute medial meniscus tear of right knee    Anxiety  Blood in stool    GERD (gastroesophageal reflux disease)    History of kidney stones    Hypercholesteremia    Hyperlipidemia    Hypertension    Hypertension    Allergies  Allergen Reactions   Apple Anaphylaxis   Latex Anaphylaxis    Latex condom   Chlorhexidine Gluconate Itching   Flounder [Fish Allergy] Swelling   Other Swelling   Oxycodone-Acetaminophen Other (See Comments)    "knocked me out" Other reaction(s): Other (See Comments) "knocked me out"   Peanut Butter Flavor Itching   Social History   Socioeconomic History    Marital status: Married    Spouse name: Not on file   Number of children: Not on file   Years of education: Not on file   Highest education level: Not on file  Occupational History   Not on file  Tobacco Use   Smoking status: Never   Smokeless tobacco: Never  Substance and Sexual Activity   Alcohol use: Yes    Comment: social   Drug use: No   Sexual activity: Not on file  Other Topics Concern   Not on file  Social History Narrative   Not on file   Social Determinants of Health   Financial Resource Strain: Not on file  Food Insecurity: Not on file  Transportation Needs: Not on file  Physical Activity: Not on file  Stress: Not on file  Social Connections: Not on file   Vitals:   12/11/20 1049  BP: 126/80  Pulse: 63  Resp: 16  SpO2: 97%   Body mass index is 24.66 kg/m.  Physical Exam Vitals and nursing note reviewed.  Constitutional:      General: She is not in acute distress.    Appearance: She is well-developed.  HENT:     Head: Normocephalic and atraumatic.     Mouth/Throat:     Mouth: Mucous membranes are moist.     Pharynx: Oropharynx is clear.  Eyes:     Conjunctiva/sclera: Conjunctivae normal.  Cardiovascular:     Rate and Rhythm: Normal rate and regular rhythm.     Pulses:          Dorsalis pedis pulses are 2+ on the right side and 2+ on the left side.     Heart sounds: No murmur heard. Pulmonary:     Effort: Pulmonary effort is normal. No respiratory distress.     Breath sounds: Normal breath sounds.  Abdominal:     Palpations: Abdomen is soft. There is no hepatomegaly or mass.     Tenderness: There is no abdominal tenderness.  Lymphadenopathy:     Cervical: No cervical adenopathy.  Skin:    General: Skin is warm.     Findings: No erythema or rash.  Neurological:     General: No focal deficit present.     Mental Status: She is alert and oriented to person, place, and time.     Cranial Nerves: No cranial nerve deficit.     Gait: Gait normal.   Psychiatric:     Comments: Well groomed, good eye contact.   ASSESSMENT AND PLAN:  Ms. Alice Ryan was seen today for 4 months follow-up.  Orders Placed This Encounter  Procedures   Basic metabolic panel   Lipid panel   TSH   Lab Results  Component Value Date   TSH 1.66 12/11/2020   Lab Results  Component Value Date   CREATININE 0.73 12/11/2020   BUN 17 12/11/2020  NA 142 12/11/2020   K 3.5 12/11/2020   CL 106 12/11/2020   CO2 28 12/11/2020   Lab Results  Component Value Date   CHOL 210 (H) 12/11/2020   HDL 60.20 12/11/2020   LDLCALC 132 (H) 12/11/2020   LDLDIRECT 165.0 02/26/2013   TRIG 88.0 12/11/2020   CHOLHDL 3 12/11/2020   Cough Explained that cough,congestion,and fatigue can last a few days and even weeks after acute COVID 19 symptoms have resolved. Lung auscultation negative. CXR could be arranged, she prefers to hold on this for now. Monitor for new symptoms.  Essential hypertension BP is now adequately controlled. Continue current management:Amlodipine-olmesartan 5-20 mg daily.  DASH/low salt diet to continue. Continue monitoring BP at home.  Anxiety disorder Problem is stable. Continue sertraline 50 mg daily.  Hyperlipidemia Continue atorvastatin 40 mg daily and low-fat diet. Further recommendation will be given according to lipid panel result.  Hypokalemia Further recommendations according to lab results.  Return in about 6 months (around 06/10/2021) for cpe.   Tamsen Reist G. [place], MD  [facility]. [place] office.

## 2020-12-11 ENCOUNTER — Encounter: Payer: Self-pay | Admitting: Family Medicine

## 2020-12-11 ENCOUNTER — Ambulatory Visit: Payer: 59 | Admitting: Family Medicine

## 2020-12-11 VITALS — BP 126/80 | HR 63 | Resp 16 | Ht 61.0 in | Wt 130.5 lb

## 2020-12-11 DIAGNOSIS — F419 Anxiety disorder, unspecified: Secondary | ICD-10-CM

## 2020-12-11 DIAGNOSIS — I10 Essential (primary) hypertension: Secondary | ICD-10-CM

## 2020-12-11 DIAGNOSIS — E876 Hypokalemia: Secondary | ICD-10-CM | POA: Diagnosis not present

## 2020-12-11 DIAGNOSIS — E785 Hyperlipidemia, unspecified: Secondary | ICD-10-CM | POA: Diagnosis not present

## 2020-12-11 DIAGNOSIS — R059 Cough, unspecified: Secondary | ICD-10-CM

## 2020-12-11 LAB — TSH: TSH: 1.66 u[IU]/mL (ref 0.35–5.50)

## 2020-12-11 LAB — BASIC METABOLIC PANEL
BUN: 17 mg/dL (ref 6–23)
CO2: 28 mEq/L (ref 19–32)
Calcium: 9 mg/dL (ref 8.4–10.5)
Chloride: 106 mEq/L (ref 96–112)
Creatinine, Ser: 0.73 mg/dL (ref 0.40–1.20)
GFR: 90.64 mL/min (ref >60.00)
Glucose, Bld: 100 mg/dL — ABNORMAL HIGH (ref 70–99)
Potassium: 3.5 mEq/L (ref 3.5–5.1)
Sodium: 142 mEq/L (ref 135–145)

## 2020-12-11 LAB — LIPID PANEL
Cholesterol: 210 mg/dL — ABNORMAL HIGH (ref 0–200)
HDL: 60.2 mg/dL (ref >39.00)
LDL Cholesterol: 132 mg/dL — ABNORMAL HIGH (ref 0–99)
NonHDL: 149.58
Total CHOL/HDL Ratio: 3
Triglycerides: 88 mg/dL (ref 0.0–149.0)
VLDL: 17.6 mg/dL (ref 0.0–40.0)

## 2020-12-11 NOTE — Assessment & Plan Note (Signed)
Problem is stable. Continue sertraline 50 mg daily.

## 2020-12-11 NOTE — Patient Instructions (Signed)
A few things to remember from today's visit:   Hypertension, unspecified type - Plan: Basic metabolic panel, TSH  Hypokalemia - Plan: Basic metabolic panel  Hyperlipidemia, unspecified hyperlipidemia type - Plan: Lipid panel  Anxiety disorder, unspecified type  If you need refills please call your pharmacy. Do not use My Chart to request refills or for acute issues that need immediate attention. No changes today. Continue monitoring blood pressure regularly and take medications daily.   Please be sure medication list is accurate. If a new problem present, please set up appointment sooner than planned today.

## 2020-12-11 NOTE — Assessment & Plan Note (Signed)
Continue atorvastatin 40 mg daily and low-fat diet. Further recommendation will be given according to lipid panel result. 

## 2020-12-11 NOTE — Assessment & Plan Note (Addendum)
BP is now adequately controlled. Continue current management:Amlodipine-olmesartan 5-20 mg daily.  DASH/low salt diet to continue. Continue monitoring BP at home.

## 2020-12-12 ENCOUNTER — Other Ambulatory Visit (HOSPITAL_COMMUNITY): Payer: Self-pay

## 2020-12-12 MED ORDER — AMLODIPINE-OLMESARTAN 5-20 MG PO TABS
1.0000 | ORAL_TABLET | Freq: Every day | ORAL | 2 refills | Status: DC
  Filled 2020-12-12: qty 90, 90d supply, fill #0
  Filled 2021-07-01: qty 90, 90d supply, fill #1

## 2020-12-12 MED ORDER — ATORVASTATIN CALCIUM 40 MG PO TABS: 40.0000 mg | ORAL_TABLET | Freq: Every day | ORAL | 2 refills | Status: DC

## 2020-12-12 MED ORDER — SERTRALINE HCL 50 MG PO TABS
50.0000 mg | ORAL_TABLET | Freq: Every day | ORAL | 2 refills | Status: DC
  Filled 2020-12-12: qty 90, 90d supply, fill #0

## 2020-12-14 ENCOUNTER — Other Ambulatory Visit (HOSPITAL_COMMUNITY): Payer: Self-pay

## 2021-03-30 ENCOUNTER — Encounter: Payer: Self-pay | Admitting: Family Medicine

## 2021-03-30 ENCOUNTER — Ambulatory Visit (INDEPENDENT_AMBULATORY_CARE_PROVIDER_SITE_OTHER): Payer: 59 | Admitting: Family Medicine

## 2021-03-30 ENCOUNTER — Other Ambulatory Visit (HOSPITAL_COMMUNITY): Payer: Self-pay

## 2021-03-30 VITALS — BP 136/80 | HR 78 | Temp 99.4°F | Resp 16 | Ht 61.0 in | Wt 132.0 lb

## 2021-03-30 DIAGNOSIS — F331 Major depressive disorder, recurrent, moderate: Secondary | ICD-10-CM | POA: Diagnosis not present

## 2021-03-30 DIAGNOSIS — F419 Anxiety disorder, unspecified: Secondary | ICD-10-CM

## 2021-03-30 MED ORDER — ALPRAZOLAM 0.25 MG PO TABS
0.1250 mg | ORAL_TABLET | Freq: Every day | ORAL | 0 refills | Status: DC | PRN
Start: 2021-03-30 — End: 2021-12-19
  Filled 2021-03-30: qty 20, 20d supply, fill #0

## 2021-03-30 MED ORDER — SERTRALINE HCL 50 MG PO TABS: 100.0000 mg | ORAL_TABLET | Freq: Every day | ORAL | 2 refills | Status: DC

## 2021-03-30 NOTE — Patient Instructions (Signed)
A few things to remember from today's visit:  Anxiety disorder, unspecified type - Plan: ALPRAZolam (XANAX) 0.25 MG tablet, sertraline (ZOLOFT) 50 MG tablet  Depression, major, recurrent, moderate (HCC) - Plan: sertraline (ZOLOFT) 50 MG tablet  If you need refills please call your pharmacy. Do not use My Chart to request refills or for acute issues that need immediate attention.   I will complete forms for medical leave/short disability as you requested, 30 days from 03/30/21. Continue counseling weekly. Sertraline increased from 50 mg to 100 mg. Xanax to take 1/2-1 tab daily as needed for acute anxiety.  Please be sure medication list is accurate. If a new problem present, please set up appointment sooner than planned today.

## 2021-03-30 NOTE — Progress Notes (Signed)
ACUTE VISIT Chief Complaint  Patient presents with   Mental Health Problem    Discuss taking leave from work for mental health    HPI: Alice Ryan is a 60 y.o. female, who is here today complaining of worsening anxiety and depressed mood. Problems exacerbated by anniversary of her husband's death and recent father illness.  Depression      The patient presents with depression.  This is a chronic problem.  The current episode started 1 to 4 weeks ago.   The onset quality is gradual.   The problem occurs daily.  The problem has been gradually worsening since onset.  Associated symptoms include fatigue, helplessness, hopelessness, decreased interest, appetite change and sad.  Associated symptoms include does not have insomnia, not irritable, no restlessness, no body aches, no myalgias, no headaches, no indigestion and no suicidal ideas.     The symptoms are aggravated by family issues.  Past treatments include SSRIs - Selective serotonin reuptake inhibitors.  Compliance with treatment is good.  Risk factors include history of mental illness, major life event and stress.   Past medical history includes anxiety and depression.     Pertinent negatives include no chronic pain, no fibromyalgia, no hypothyroidism, no chronic illness, no recent illness, no life-threatening condition, no physical disability, no eating disorder, no obsessive-compulsive disorder, no post-traumatic stress disorder, no schizophrenia, no suicide attempts and no head trauma. Her father was recently Dx'ed with COVID 19 pneumonia, hospitalized and completed inpt rehab.He was under a great deal of stress trying to find SNF.  She is doing CBT  through hospice program, one to one, just started. According to pt, it was recommended to take some time off from work, she would like 30 days medical leave.  She has seen psychiatrist in the past. 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 Having episodes of  acute anxiety at work, feels weak and nauseated.  She is currently on Sertraline 50 mg daily.  Depression screen [facility] 2/9 03/30/2021 08/24/2020 08/07/2017 08/07/2017 05/11/2017  Decreased Interest 3 2 0 0 0  Down, Depressed, Hopeless 3 2 0 0 0  PHQ - 2 Score 6 4 0 0 0  Altered sleeping 0 2 0 - -  Tired, decreased energy 3 2 0 - -  Change in appetite 2 0 0 - -  Feeling bad or failure about yourself  0 0 0 - -  Trouble concentrating 0 3 0 - -  Moving slowly or fidgety/restless 0 3 0 - -  Suicidal thoughts 0 0 0 - -  PHQ-9 Score 11 14 0 - -  Difficult doing work/chores Very difficult Somewhat difficult - - -   Review of Systems  Constitutional:  Positive for appetite change and fatigue. Negative for activity change, chills and fever.  Respiratory:  Negative for chest tightness and shortness of breath.   Cardiovascular:  Negative for chest pain and leg swelling.  Gastrointestinal:  Negative for abdominal pain and vomiting.  Musculoskeletal:  Negative for gait problem and myalgias.  Neurological:  Negative for syncope, weakness and headaches.  Psychiatric/Behavioral:  Positive for depression. Negative for confusion, hallucinations, sleep disturbance and suicidal ideas. The patient is nervous/anxious. The patient does not have insomnia.   Rest see pertinent positives and negatives per HPI.  Current Outpatient Medications on File Prior to Visit  Medication Sig Dispense Refill   amLODipine-olmesartan (AZOR) 5-20 MG tablet Take 1 tablet by mouth daily. 90 tablet 2  atorvastatin (LIPITOR) 40 MG tablet Take 1 tablet (40 mg total) by mouth daily. 90 tablet 2   diclofenac sodium (VOLTAREN) 1 % GEL Apply 4 g topically 4 (four) times daily. 150 g 1   mometasone (NASONEX) 50 MCG/ACT nasal spray PLACE 2 SPRAYS INTO THE NOSE DAILY. 17 g 0   triamcinolone cream (KENALOG) 0.1 % Apply 1 application topically 2 (two) times daily. 30 g 0   valACYclovir (VALTREX) 1000 MG tablet TAKE 2 TABLETS BY MOUTH TWICE  DAILY FOR 1 DAY. TAKE AT FIST SIGNS OF OUTBREAK. 30 tablet 1   fluticasone (FLONASE) 50 MCG/ACT nasal spray PLACE 2 SPRAYS IN EACH NOSTRIL DAILY 16 g 6   No current facility-administered medications on file prior to visit.   Past Medical History:  Diagnosis Date   Acid reflux    Acute lateral meniscus tear of right knee    Acute medial meniscus tear of right knee    Anxiety    Blood in stool    GERD (gastroesophageal reflux disease)    History of kidney stones    Hypercholesteremia    Hyperlipidemia    Hypertension    Hypertension    Allergies  Allergen Reactions   Apple Anaphylaxis   Latex Anaphylaxis    Latex condom   Chlorhexidine Gluconate Itching   Flounder [Fish Allergy] Swelling   Other Swelling   Oxycodone-Acetaminophen Other (See Comments)    "knocked me out" Other reaction(s): Other (See Comments) "knocked me out"   Peanut Butter Flavor Itching    Social History   Socioeconomic History   Marital status: Married    Spouse name: Not on file   Number of children: Not on file   Years of education: Not on file   Highest education level: Not on file  Occupational History   Not on file  Tobacco Use   Smoking status: Never   Smokeless tobacco: Never  Substance and Sexual Activity   Alcohol use: Yes    Comment: social   Drug use: No   Sexual activity: Not on file  Other Topics Concern   Not on file  Social History Narrative   Not on file   Social Determinants of Health   Financial Resource Strain: Not on file  Food Insecurity: Not on file  Transportation Needs: Not on file  Physical Activity: Not on file  Stress: Not on file  Social Connections: Not on file   Vitals:   03/30/21 1211  BP: 136/80  Pulse: 78  Resp: 16  Temp: 99.4 F (37.4 C)  SpO2: 98%   Body mass index is 24.94 kg/m.  Physical Exam Vitals and nursing note reviewed.  Constitutional:      General: She is not irritable.She is not in acute distress.    Appearance: She is  well-developed.  HENT:     Head: Normocephalic and atraumatic.  Eyes:     Conjunctiva/sclera: Conjunctivae normal.  Cardiovascular:     Rate and Rhythm: Normal rate and regular rhythm.     Heart sounds: No murmur heard. Pulmonary:     Effort: Pulmonary effort is normal. No respiratory distress.     Breath sounds: Normal breath sounds.  Abdominal:     Palpations: Abdomen is soft. There is no mass.     Tenderness: There is no abdominal tenderness.  Musculoskeletal:     Right lower leg: No edema.     Left lower leg: No edema.  Skin:    General: Skin is warm.  Findings: No erythema or rash.  Neurological:     General: No focal deficit present.     Mental Status: She is alert and oriented to person, place, and time.     Gait: Gait normal.  Psychiatric:        Mood and Affect: Mood is anxious. Affect is labile.     Comments: Well groomed, good eye contact.   ASSESSMENT AND PLAN:  Alice Ryan was seen today for mental health problem.  Diagnoses and all orders for this visit:  Depression, major, recurrent, moderate (HCC) -     sertraline (ZOLOFT) 50 MG tablet; Take 2 tablets (100 mg total) by mouth daily.  Anxiety disorder, unspecified type -     ALPRAZolam (XANAX) 0.25 MG tablet; Take 0.5-1 tablets (0.125-0.25 mg total) by mouth daily as needed for anxiety. -     sertraline (ZOLOFT) 50 MG tablet; Take 2 tablets (100 mg total) by mouth daily.   Both above problems worse for the past few weeks. Requesting 30 days medical leave, states that this is the minimal amount of time she can have. She will have employer faxing forms to the office, starting 03/30/21 and ending 04/30/2021. Sertraline dose increased from 50 mg to 100 mg daily. For acute episodes of anxiety I recommend trying Xanax low dose for at least 6-8 weeks, some side effects discussed. Continue CBT, planning on doing it weekly. If she does not noted great improvement, will recommend establishing with psychiatrist.  I  spent a total of 30 minutes in both face to face and non face to face activities for this visit on the date of this encounter. During this time history was obtained and documented, examination was performed, and assessment/plan discussed.  Return in about 6 weeks (around 05/11/2021).  Myna Freimark G. [place], MD  [facility] [place] office.

## 2021-04-02 ENCOUNTER — Telehealth: Payer: Self-pay | Admitting: Family Medicine

## 2021-04-02 NOTE — Telephone Encounter (Signed)
Patient called to check on paperwork faxed over from [employer]. Patient states that paperwork was faxed a day or two ago. I let her know that Dr.Jordan and the team has left for the day and would be back in Tuesday, but anything that was faxed a day or so ago had already been taken to the back. I told her if [employer] wanted they could always refax paperwork just in case. Patient would like a callback when completed and sent to [employer]. Due date for paperwork to be completed and sent is 04/22/20    Please advise

## 2021-04-06 NOTE — Telephone Encounter (Signed)
I called and spoke with patient. She is aware that all paperwork has been faxed back.

## 2021-04-27 NOTE — Progress Notes (Signed)
HPI: [name] is a 61 y.o. female, who is here today to follow on recent visit. She was last seen on 03/30/21, when she was c/o worsening anxiety and requested 30 days off from work for mental health. We also agreed on increasing dose of Sertraline from 50 mg to 100 mg and Xanax 0.25 mg daily as needed for acute anxiety. She is still taking Sertraline 50 mg daily.  She is feeling better. One anxiety attack since her last visit. She usually feels nauseated ,start "gagging",and vomiting. It happens while she was in shower, no known precipitating factor. She took a Xanax ,which helped. CBT as needed, 05/13/21 appt. Other stressors have improved, almost lost her house, she was able to make arrangements and keep it. 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 Negative for suicidal thoughts. Some difficulty sleeping through the night, she works 3rd shift.  Tender lesions around pubic area, this problem has been intermittent for years, it seems to be exacerbated by shaving. OTC abx oilment. Baking soda bath also help. She has not had fever,chills,changes in appetite,vaginal discharge or bleeding.  Review of Systems  Constitutional:  Negative for activity change and appetite change.  HENT:  Negative for mouth sores and sore throat.   Respiratory:  Negative for cough and wheezing.   Cardiovascular:  Negative for chest pain, palpitations and leg swelling.  Gastrointestinal:  Negative for abdominal pain.       Negative for changes in bowel habits.  Genitourinary:  Negative for decreased urine volume, dysuria and hematuria.  Neurological:  Negative for syncope and weakness.  Hematological:  Negative for adenopathy. Does not bruise/bleed easily.  Rest see pertinent positives and negatives per HPI.  Current Outpatient Medications on File Prior to Visit  Medication Sig Dispense Refill   ALPRAZolam (XANAX) 0.25 MG tablet Take 0.5-1 tablets (0.125-0.25 mg  total) by mouth daily as needed for anxiety. 20 tablet 0   amLODipine-olmesartan (AZOR) 5-20 MG tablet Take 1 tablet by mouth daily. 90 tablet 2   atorvastatin (LIPITOR) 40 MG tablet Take 1 tablet (40 mg total) by mouth daily. 90 tablet 2   diclofenac sodium (VOLTAREN) 1 % GEL Apply 4 g topically 4 (four) times daily. 150 g 1   mometasone (NASONEX) 50 MCG/ACT nasal spray PLACE 2 SPRAYS INTO THE NOSE DAILY. 17 g 0   sertraline (ZOLOFT) 50 MG tablet Take 2 tablets (100 mg total) by mouth daily. 90 tablet 2   triamcinolone cream (KENALOG) 0.1 % Apply 1 application topically 2 (two) times daily. 30 g 0   valACYclovir (VALTREX) 1000 MG tablet TAKE 2 TABLETS BY MOUTH TWICE DAILY FOR 1 DAY. TAKE AT FIST SIGNS OF OUTBREAK. 30 tablet 1   fluticasone (FLONASE) 50 MCG/ACT nasal spray PLACE 2 SPRAYS IN EACH NOSTRIL DAILY 16 g 6   No current facility-administered medications on file prior to visit.    Past Medical History:  Diagnosis Date   Acid reflux    Acute lateral meniscus tear of right knee    Acute medial meniscus tear of right knee    Anxiety    Blood in stool    GERD (gastroesophageal reflux disease)    History of kidney stones    Hypercholesteremia    Hyperlipidemia    Hypertension    Hypertension    Allergies  Allergen Reactions   Apple Anaphylaxis   Latex Anaphylaxis    Latex condom   Chlorhexidine Gluconate Itching  Flounder [Fish Allergy] Swelling   Other Swelling   Oxycodone-Acetaminophen Other (See Comments)    "knocked me out" Other reaction(s): Other (See Comments) "knocked me out"   Peanut Butter Flavor Itching    Social History   Socioeconomic History   Marital status: Married    Spouse name: Not on file   Number of children: Not on file   Years of education: Not on file   Highest education level: Not on file  Occupational History   Not on file  Tobacco Use   Smoking status: Never   Smokeless tobacco: Never  Substance and Sexual Activity   Alcohol use:  Yes    Comment: social   Drug use: No   Sexual activity: Not on file  Other Topics Concern   Not on file  Social History Narrative   Not on file   Social Determinants of Health   Financial Resource Strain: Not on file  Food Insecurity: Not on file  Transportation Needs: Not on file  Physical Activity: Not on file  Stress: Not on file  Social Connections: Not on file   Vitals:   04/28/21 1052  BP: 128/70  Pulse: 65  Resp: 16  SpO2: 99%   Body mass index is 24.82 kg/m.  Physical Exam Vitals and nursing note reviewed. Exam conducted with a chaperone present.  Constitutional:      General: She is not in acute distress.    Appearance: She is well-developed.  HENT:     Head: Normocephalic and atraumatic.  Eyes:     Conjunctiva/sclera: Conjunctivae normal.  Cardiovascular:     Rate and Rhythm: Normal rate and regular rhythm.     Heart sounds: No murmur heard. Pulmonary:     Effort: Pulmonary effort is normal. No respiratory distress.     Breath sounds: Normal breath sounds.  Abdominal:     Palpations: Abdomen is soft. There is no mass.     Tenderness: There is no abdominal tenderness.  Skin:    General: Skin is warm.     Findings: Lesion present. No erythema or rash.          Comments: Close to right major labia and left-sided pubic area with indurated,erythematous lesions, 1.5-2.5 cm. Tender. Left pubic area lesion with purulent drainage when applying gentile pressure. Right sided lesion with small superficial excoriation, no draining.  Neurological:     General: No focal deficit present.     Mental Status: She is alert and oriented to person, place, and time.     Cranial Nerves: No cranial nerve deficit.     Gait: Gait normal.  Psychiatric:        Mood and Affect: Affect normal.     Comments: Well groomed, good eye contact.   ASSESSMENT AND PLAN:  Ms.Hether was seen today for follow-up.  Diagnoses and all orders for this visit:  Forunculosis Recommend  keeping areas clean with soap and water. Worm compresses or sitz bath may help. Shaving seems to be the precipitating factors, so recommend avoiding it. Doxycycline x 14 days, may need longer treatments if recurrent.  -     doxycycline (VIBRA-TABS) 100 MG tablet; Take 1 tablet (100 mg total) by mouth 2 (two) times daily for 14 days.  Anxiety disorder She has noted some improvement, so continue Sertraline 50 mg daily. Xanax 0.25 mg daily as needed to keep in case she has acute anxiety. Continue CBT but ideally periodically instead prn for now.  Return in about 4  months (around 08/26/2021).  Sabre Romberger G. [place], MD  [facility]. [place] office.

## 2021-04-28 ENCOUNTER — Encounter: Payer: Self-pay | Admitting: Family Medicine

## 2021-04-28 ENCOUNTER — Other Ambulatory Visit (HOSPITAL_COMMUNITY): Payer: Self-pay

## 2021-04-28 ENCOUNTER — Ambulatory Visit: Payer: 59 | Admitting: Family Medicine

## 2021-04-28 VITALS — BP 128/70 | HR 65 | Resp 16 | Ht 61.0 in | Wt 131.4 lb

## 2021-04-28 DIAGNOSIS — F419 Anxiety disorder, unspecified: Secondary | ICD-10-CM | POA: Diagnosis not present

## 2021-04-28 DIAGNOSIS — L0292 Furuncle, unspecified: Secondary | ICD-10-CM | POA: Diagnosis not present

## 2021-04-28 MED ORDER — DOXYCYCLINE HYCLATE 100 MG PO TABS
100.0000 mg | ORAL_TABLET | Freq: Two times a day (BID) | ORAL | 0 refills | Status: AC
  Filled 2021-04-28: qty 28, 14d supply, fill #0

## 2021-04-28 NOTE — Assessment & Plan Note (Signed)
She has noted some improvement, so continue Sertraline 50 mg daily. Xanax 0.25 mg daily as needed to keep in case she has acute anxiety. Continue CBT but ideally periodically instead prn for now.

## 2021-04-28 NOTE — Patient Instructions (Addendum)
A few things to remember from today's visit:  Forunculosis - Plan: doxycycline (VIBRA-TABS) 100 MG tablet  Anxiety disorder, unspecified type  If you need refills please call your pharmacy. Do not use My Chart to request refills or for acute issues that need immediate attention.  Use antibacterial soap. Monitor for fever. Avoid shaving. Skin Abscess A skin abscess is an infected area of your skin that contains pus and other material. An abscess can happen in any part of your body. Some abscesses break open (rupture) on their own. Most continue to get worse unless they are treated. The infection can spread deeper into the body and into your blood, which can make you feel sick. A skin abscess is caused by germs that enter the skin through a cut or scrape. It can also be caused by blocked oil and sweat glands or infected hair follicles. This condition is usually treated by: Draining the pus. Taking antibiotic medicines. Placing a warm, wet washcloth over the abscess. Follow these instructions at home: Medicines  Take over-the-counter and prescription medicines only as told by your doctor. If you were prescribed an antibiotic medicine, take it as told by your doctor. Do not stop taking the antibiotic even if you start to feel better. Abscess care  If you have an abscess that has not drained, place a warm, clean, wet washcloth over the abscess several times a day. Do this as told by your doctor. Follow instructions from your doctor about how to take care of your abscess. Make sure you: Cover the abscess with a bandage (dressing). Change your bandage or gauze as told by your doctor. Wash your hands with soap and water before you change the bandage or gauze. If you cannot use soap and water, use hand sanitizer. Check your abscess every day for signs that the infection is getting worse. Check for: More redness, swelling, or pain. More fluid or blood. Warmth. More pus or a bad  smell. General instructions To avoid spreading the infection: Do not share personal care items, towels, or hot tubs with others. Avoid making skin-to-skin contact with other people. Keep all follow-up visits as told by your doctor. This is important. Contact a doctor if: You have more redness, swelling, or pain around your abscess. You have more fluid or blood coming from your abscess. Your abscess feels warm when you touch it. You have more pus or a bad smell coming from your abscess. You have a fever. Your muscles ache. You have chills. You feel sick. Get help right away if: You have very bad (severe) pain. You see red streaks on your skin spreading away from the abscess. Summary A skin abscess is an infected area of your skin that contains pus and other material. The abscess is caused by germs that enter the skin through a cut or scrape. It can also be caused by blocked oil and sweat glands or infected hair follicles. Follow your doctor's instructions on caring for your abscess, taking medicines, preventing infections, and keeping follow-up visits. This information is not intended to replace advice given to you by your health care provider. Make sure you discuss any questions you have with your health care provider. Document Revised: 10/25/2018 Document Reviewed: 05/04/2017 Elsevier Patient Education  2022 Else[employer] Inc.  Please be sure medication list is accurate. If a new problem present, please set up appointment sooner than planned today.

## 2021-04-29 ENCOUNTER — Encounter: Payer: Self-pay | Admitting: Family Medicine

## 2021-05-25 DIAGNOSIS — M1711 Unilateral primary osteoarthritis, right knee: Secondary | ICD-10-CM | POA: Diagnosis not present

## 2021-06-04 IMAGING — US ULTRASOUND RIGHT BREAST LIMITED
1 series · 8 of 8 positions shown · non-contrast
Comparison: Previous exam(s).

CLINICAL DATA: Follow-up for probably benign right breast mass.
History of benign left breast excision 2855. Family history of
breast cancer with mother diagnosed with breast cancer at age 70.

EXAM:
DIGITAL DIAGNOSTIC BILATERAL MAMMOGRAM WITH CAD AND TOMO
RIGHT BREAST ULTRASOUND

[Series 1: ultrasound right breast limited · 0.06mm/px · 8 of 8 slices shown]
[im 1/8]
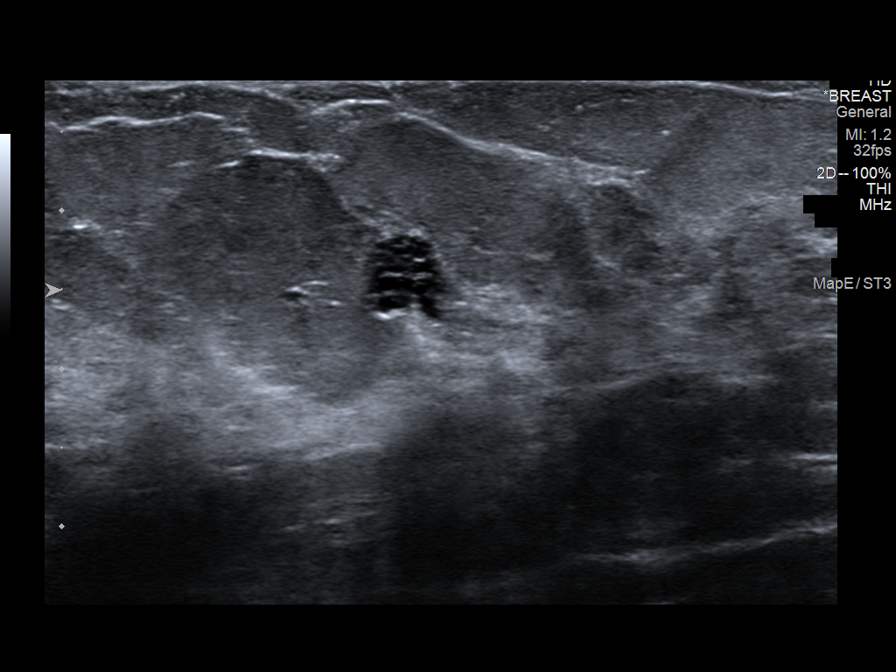
[im 2/8]
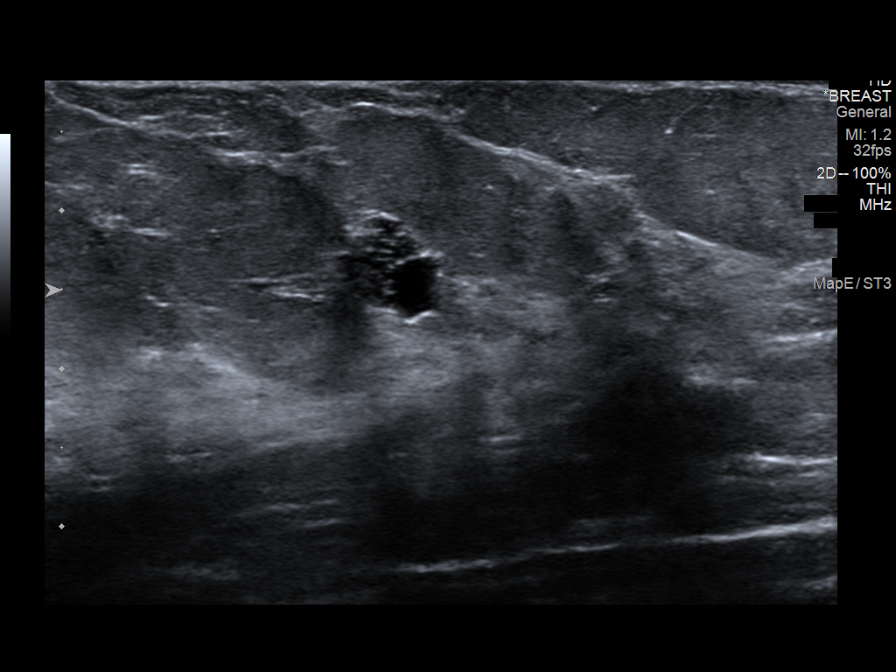
[im 3/8]
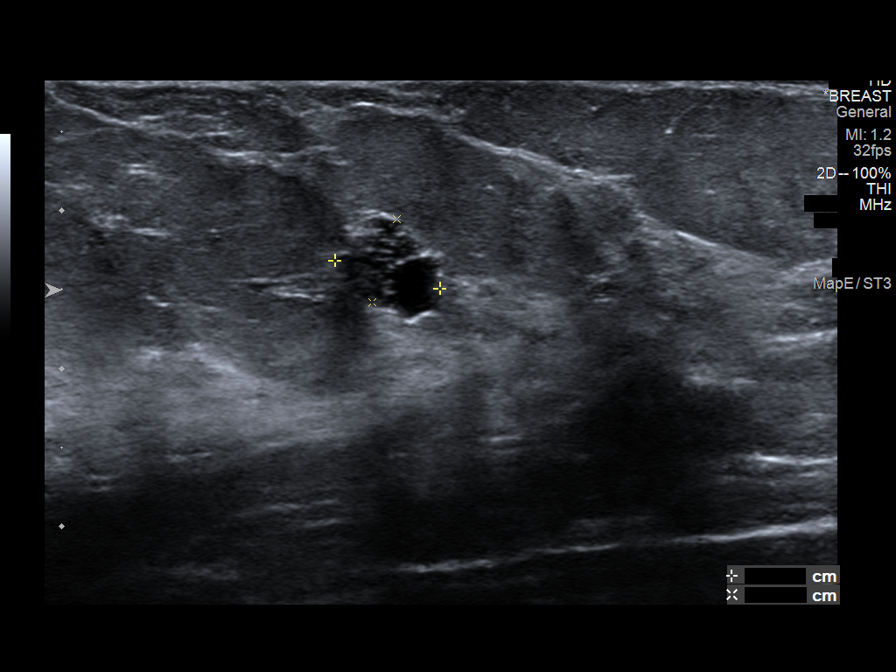
[im 4/8]
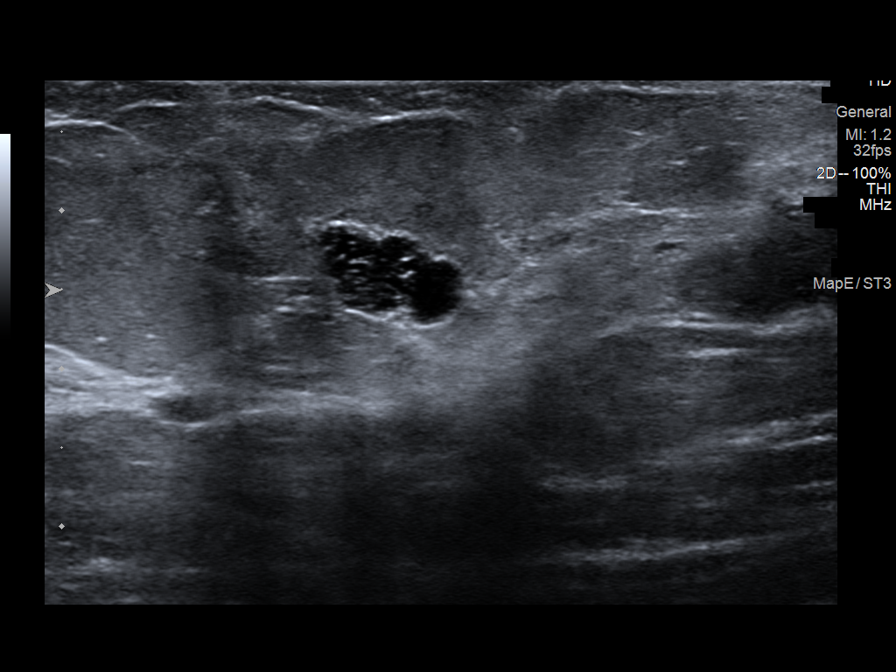
[im 5/8]
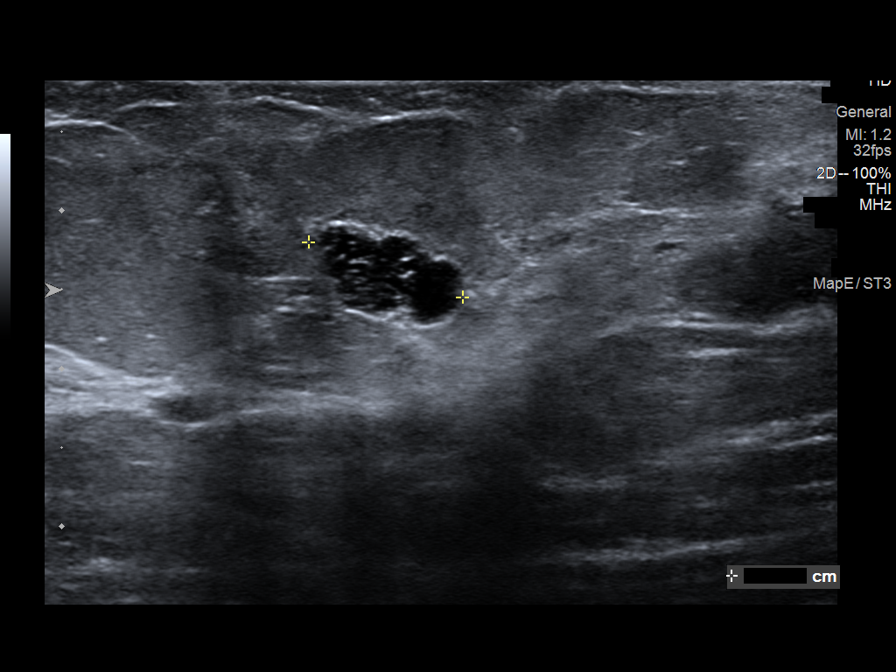
[im 6/8]
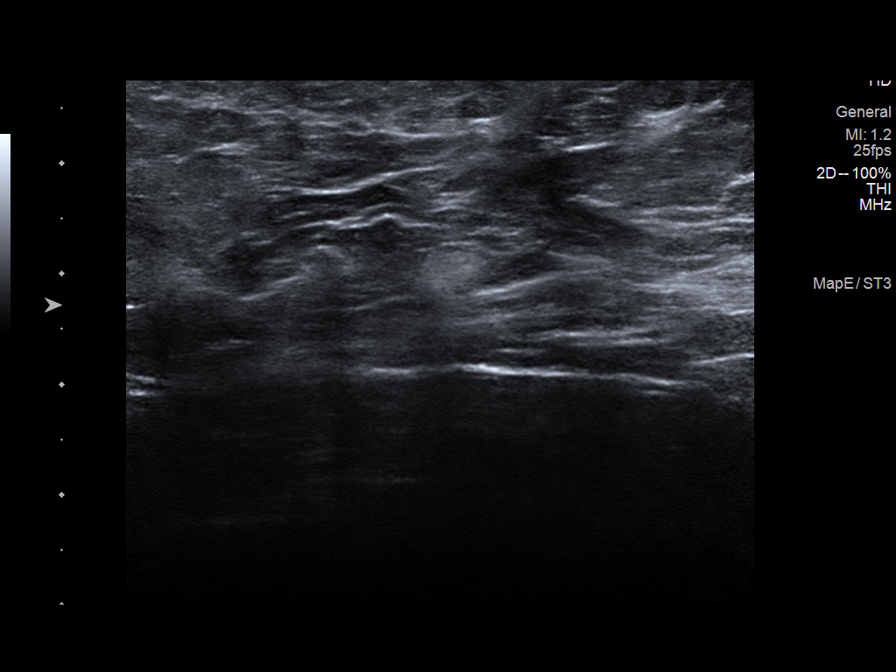
[im 7/8]
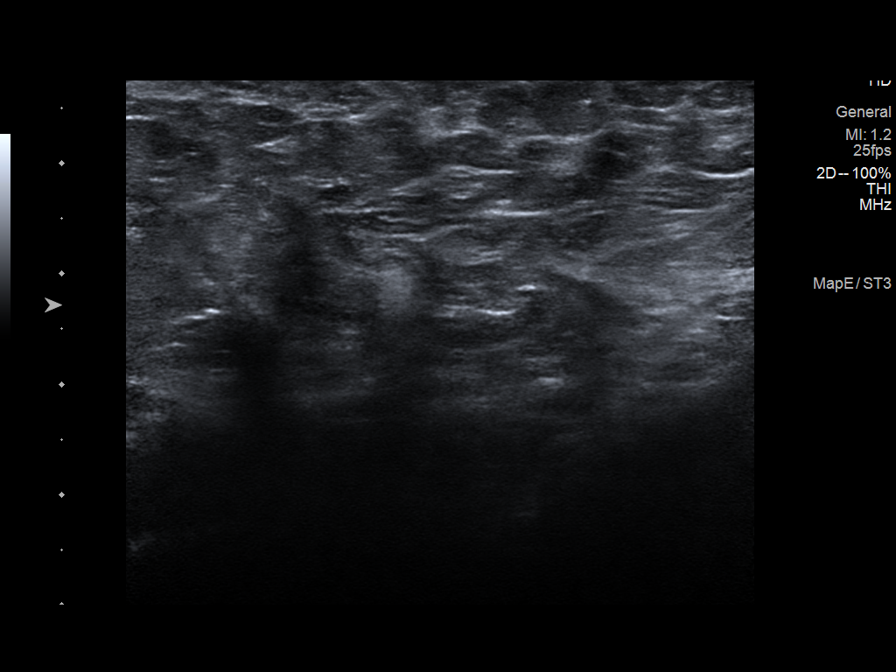
[im 8/8]
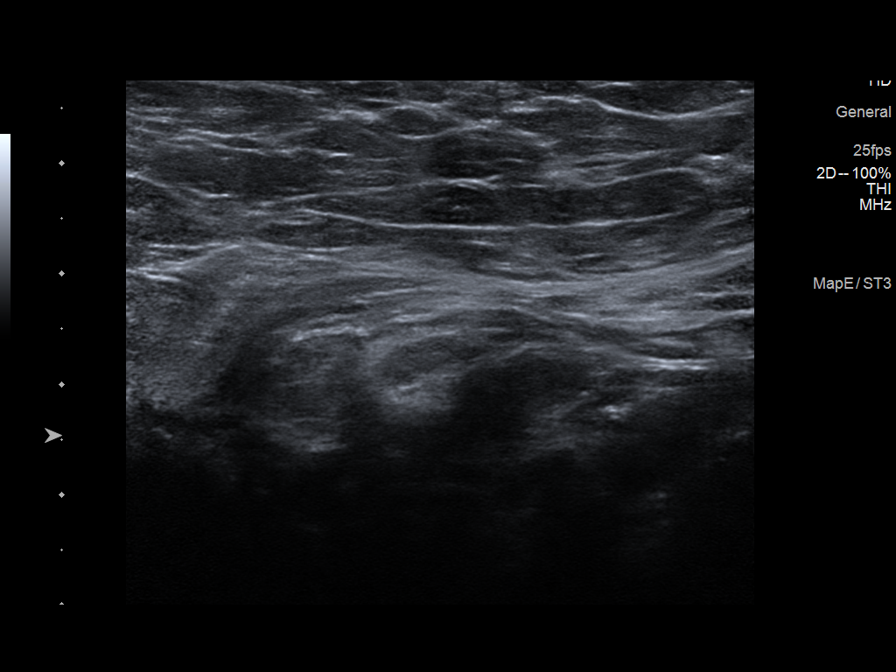

[8 of 8 positions shown; findings below may reference images not displayed]

ACR Breast Density Category c: The breast tissue is heterogeneously
dense, which may obscure small masses.
FINDINGS: There is a subtle area of distortion in the right breast best
visualized on the CC tomograms. The oval circumscribed probably
benign mass in the right breast appears stable to slightly smaller
when compared to the prior exams. No suspicious masses or
calcifications are seen in the left breast.

Mammographic images were processed with CAD.

Targeted ultrasound of the right breast was performed. The benign
appearing cluster of cysts at the [DATE] position 1 cm from the nipple
measures 1 x 0.6 x 0.7 cm, stable. Additional numerous similar
appearing tiny cysts and clusters of cysts are seen throughout the
central right breast. No definite sonographic correlate for the
subtle distortion seen in the right breast at mammography. No
axillary lymphadenopathy seen.
IMPRESSION: Right breast distortion, a subtle finding best visualized on the CC
tomograms.

RECOMMENDATION:
Stereotactic guided biopsy of the distortion in the right breast is
recommended. This will be scheduled for the patient.

I have discussed the findings and recommendations with the patient.
Results were also provided in writing at the conclusion of the
visit. If applicable, a reminder letter will be sent to the patient
regarding the next appointment.

BI-RADS CATEGORY  4: Suspicious.

## 2021-06-04 IMAGING — MG DIGITAL DIAGNOSTIC BILATERAL MAMMOGRAM WITH TOMO AND CAD
6 series · 6 of 18 positions shown · non-contrast
Comparison: Previous exam(s).

CLINICAL DATA: Follow-up for probably benign right breast mass.
History of benign left breast excision 2855. Family history of
breast cancer with mother diagnosed with breast cancer at age 70.

EXAM:
DIGITAL DIAGNOSTIC BILATERAL MAMMOGRAM WITH CAD AND TOMO
RIGHT BREAST ULTRASOUND

[R CC synth-2D (1 of 2)]
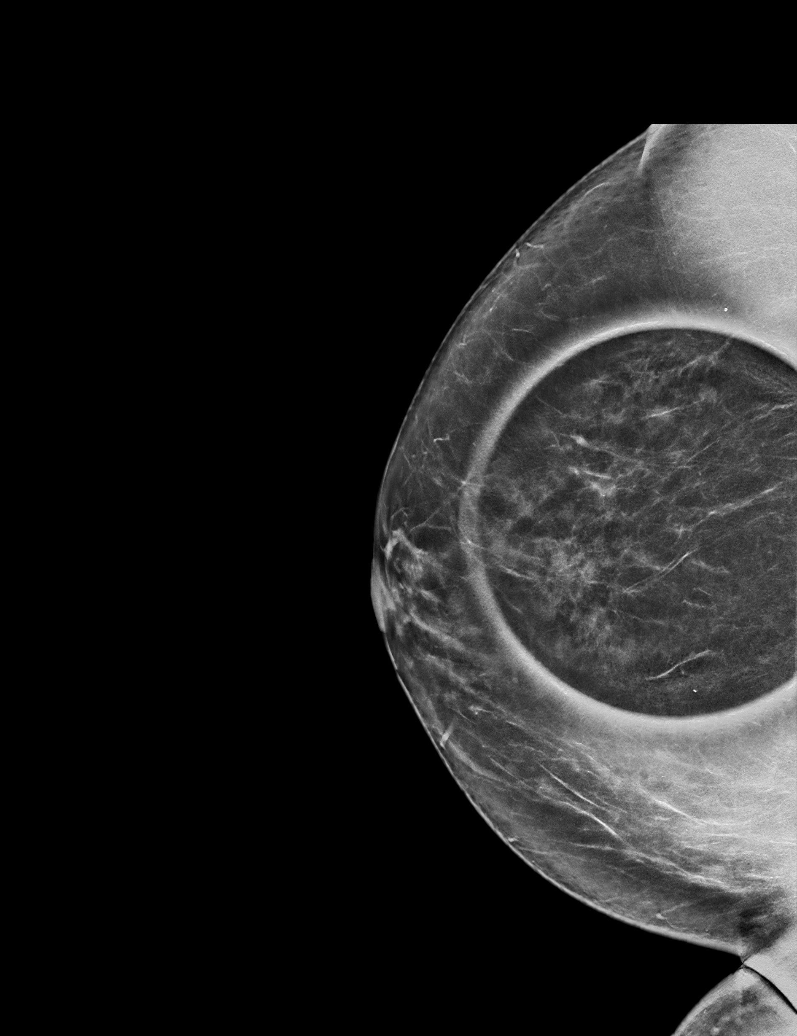

[R CC synth-2D (2 of 2)]
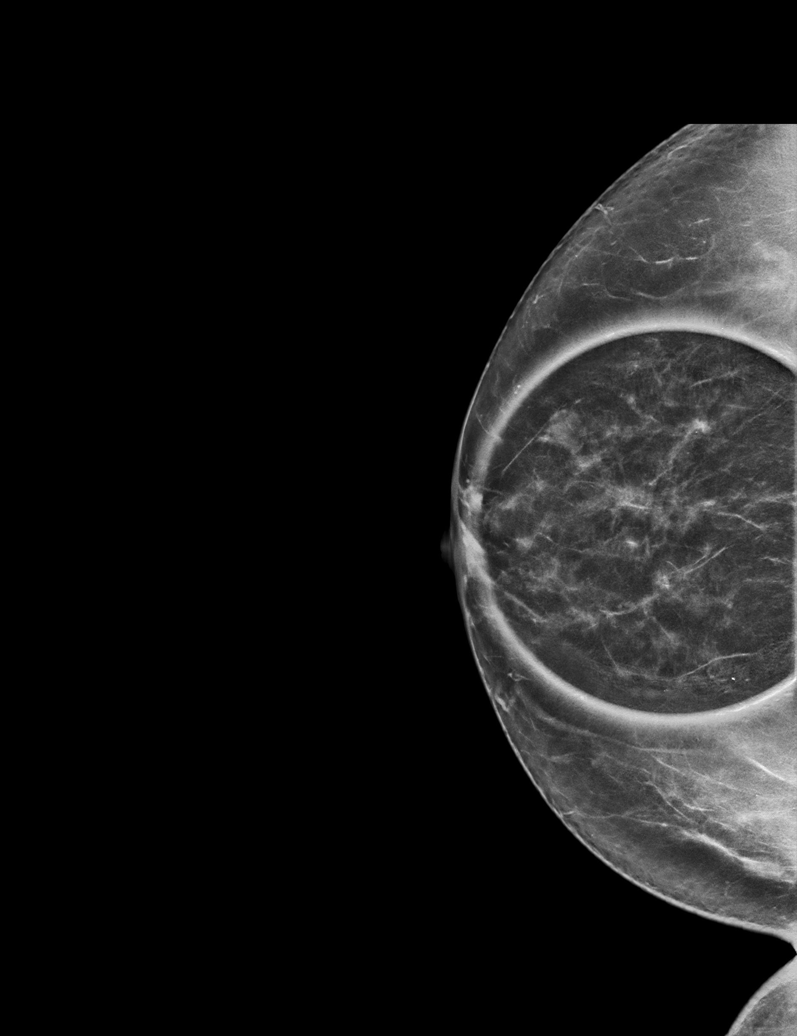

[R ML synth-2D]
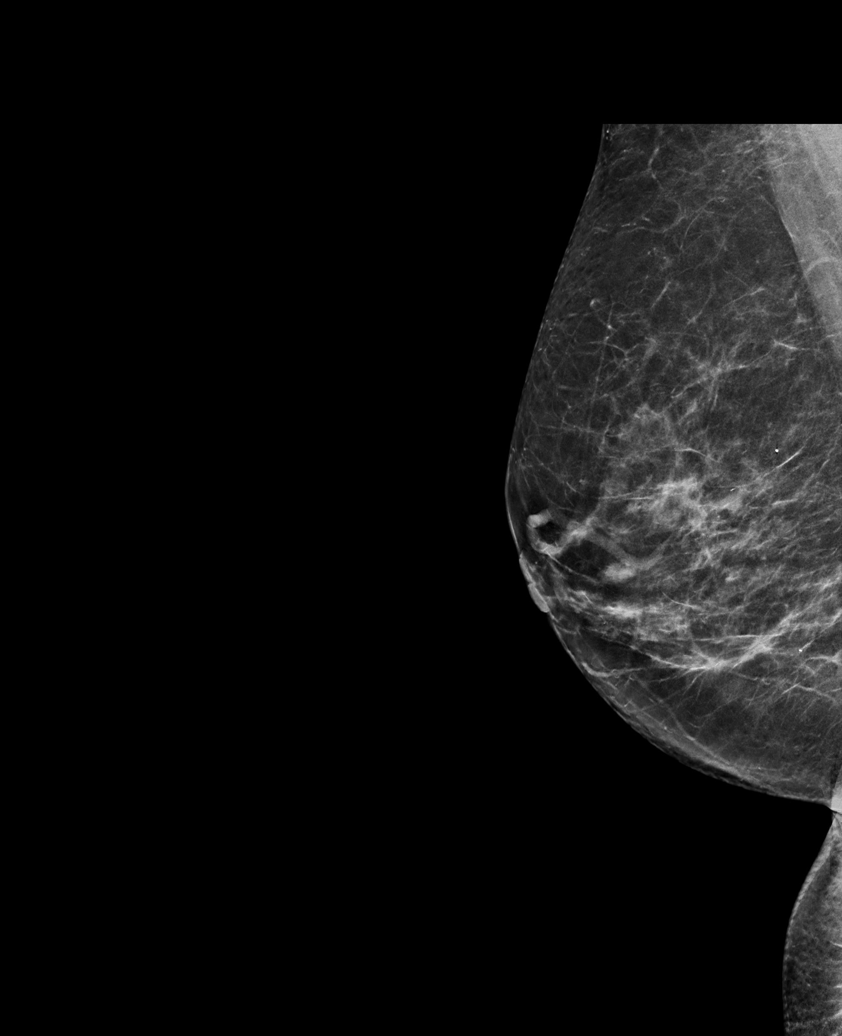

[R ML tomo · tomo slice 36/71.0]
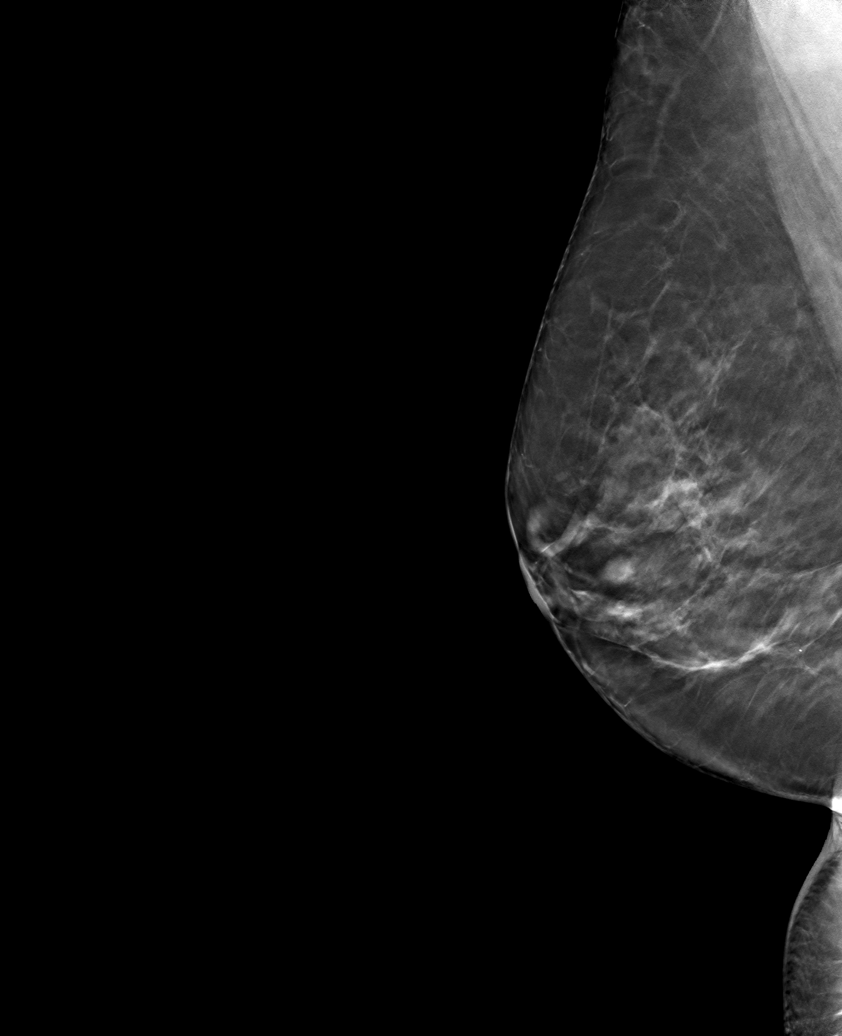

[R CC tomo (1 of 2) · tomo slice 36/71.0]
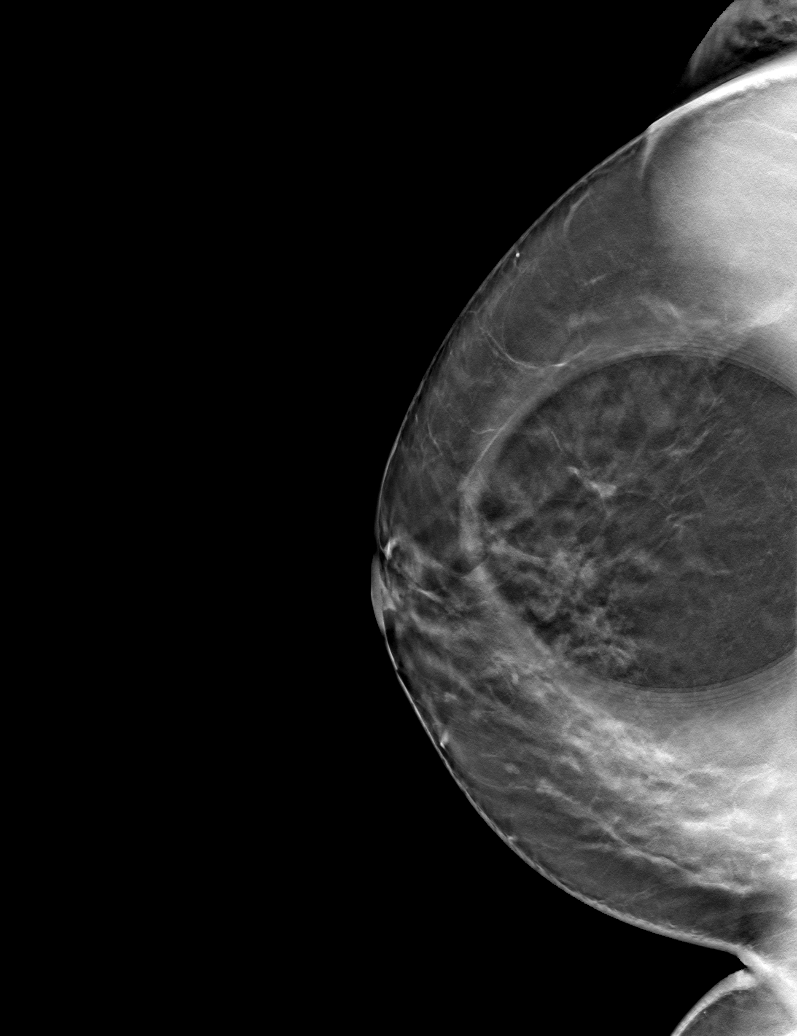

[R CC tomo (2 of 2) · tomo slice 31/62.0]
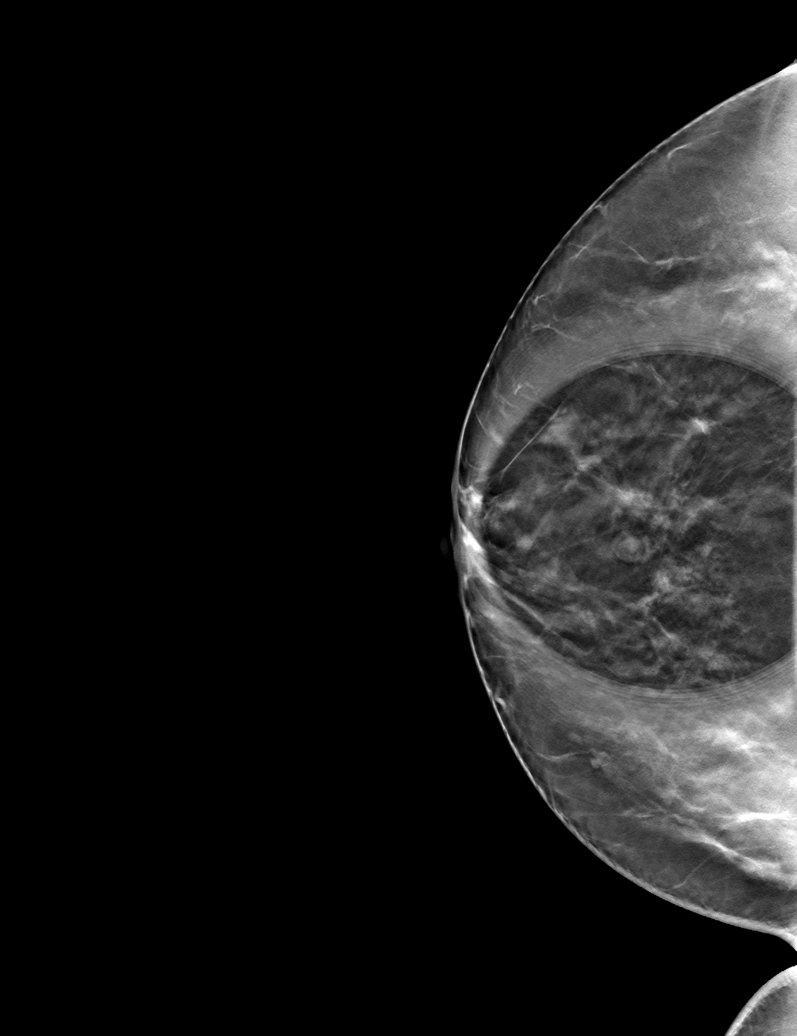

[6 of 18 positions shown; findings below may reference images not displayed]

ACR Breast Density Category c: The breast tissue is heterogeneously
dense, which may obscure small masses.
FINDINGS: There is a subtle area of distortion in the right breast best
visualized on the CC tomograms. The oval circumscribed probably
benign mass in the right breast appears stable to slightly smaller
when compared to the prior exams. No suspicious masses or
calcifications are seen in the left breast.

Mammographic images were processed with CAD.

Targeted ultrasound of the right breast was performed. The benign
appearing cluster of cysts at the [DATE] position 1 cm from the nipple
measures 1 x 0.6 x 0.7 cm, stable. Additional numerous similar
appearing tiny cysts and clusters of cysts are seen throughout the
central right breast. No definite sonographic correlate for the
subtle distortion seen in the right breast at mammography. No
axillary lymphadenopathy seen.
IMPRESSION: Right breast distortion, a subtle finding best visualized on the CC
tomograms.

RECOMMENDATION:
Stereotactic guided biopsy of the distortion in the right breast is
recommended. This will be scheduled for the patient.

I have discussed the findings and recommendations with the patient.
Results were also provided in writing at the conclusion of the
visit. If applicable, a reminder letter will be sent to the patient
regarding the next appointment.

BI-RADS CATEGORY  4: Suspicious.

## 2021-07-01 ENCOUNTER — Other Ambulatory Visit (HOSPITAL_COMMUNITY): Payer: Self-pay

## 2021-07-01 ENCOUNTER — Other Ambulatory Visit: Payer: Self-pay | Admitting: Family Medicine

## 2021-07-01 DIAGNOSIS — F419 Anxiety disorder, unspecified: Secondary | ICD-10-CM

## 2021-07-02 ENCOUNTER — Other Ambulatory Visit (HOSPITAL_COMMUNITY): Payer: Self-pay

## 2021-07-02 MED ORDER — SERTRALINE HCL 50 MG PO TABS
50.0000 mg | ORAL_TABLET | Freq: Every day | ORAL | 2 refills | Status: DC
  Filled 2021-07-02: qty 90, 90d supply, fill #0
  Filled 2021-12-19: qty 90, 90d supply, fill #1

## 2021-07-02 MED ORDER — ATORVASTATIN CALCIUM 40 MG PO TABS
40.0000 mg | ORAL_TABLET | Freq: Every day | ORAL | 2 refills | Status: DC
  Filled 2021-07-02: qty 90, 90d supply, fill #0
  Filled 2021-12-19: qty 90, 90d supply, fill #1

## 2021-08-25 ENCOUNTER — Encounter: Payer: Self-pay | Admitting: Family Medicine

## 2021-08-25 ENCOUNTER — Other Ambulatory Visit (HOSPITAL_COMMUNITY): Payer: Self-pay

## 2021-08-25 ENCOUNTER — Ambulatory Visit: Payer: 59 | Admitting: Family Medicine

## 2021-08-25 VITALS — BP 122/80 | HR 63 | Resp 16 | Ht 61.0 in | Wt 129.4 lb

## 2021-08-25 DIAGNOSIS — I10 Essential (primary) hypertension: Secondary | ICD-10-CM

## 2021-08-25 DIAGNOSIS — L255 Unspecified contact dermatitis due to plants, except food: Secondary | ICD-10-CM | POA: Diagnosis not present

## 2021-08-25 DIAGNOSIS — F3341 Major depressive disorder, recurrent, in partial remission: Secondary | ICD-10-CM

## 2021-08-25 DIAGNOSIS — F419 Anxiety disorder, unspecified: Secondary | ICD-10-CM | POA: Diagnosis not present

## 2021-08-25 MED ORDER — TRIAMCINOLONE ACETONIDE 0.1 % EX CREA
1.0000 "application " | TOPICAL_CREAM | Freq: Every day | CUTANEOUS | 1 refills | Status: DC | PRN
  Filled 2021-08-25: qty 45, 30d supply, fill #0

## 2021-08-25 MED ORDER — AMLODIPINE-OLMESARTAN 5-20 MG PO TABS
1.0000 | ORAL_TABLET | Freq: Every day | ORAL | 2 refills | Status: DC
  Filled 2021-08-25: qty 90, 90d supply, fill #0
  Filled 2021-12-19: qty 60, 60d supply, fill #0
  Filled 2021-12-22: qty 30, 30d supply, fill #0
  Filled 2022-08-08: qty 90, 90d supply, fill #1

## 2021-08-25 MED ORDER — PREDNISONE 20 MG PO TABS
40.0000 mg | ORAL_TABLET | Freq: Every day | ORAL | 0 refills | Status: DC
Start: 2021-08-25 — End: 2021-11-12
  Filled 2021-08-25: qty 10, 5d supply, fill #0

## 2021-08-25 NOTE — Assessment & Plan Note (Signed)
Problem is stable. Continue sertraline 50 mg daily and alprazolam 0.25 mg 1/2 to 1 tablet daily as needed.

## 2021-08-25 NOTE — Progress Notes (Signed)
ACUTE VISIT Chief Complaint  Patient presents with   Rash    Noticed it on Monday, itching, burning. On left arm, and starting to transfer to right arm & neck.    HPI: Alice Ryan is a 61 y.o. female, who is here today complaining of pruritic and tender rash as described above. Rash This is a recurrent problem. The current episode started in the past 7 days. The problem has been gradually worsening since onset. The affected locations include the back, left wrist, left hand, left fingers and right arm. The rash is characterized by burning, redness and itchiness. She was exposed to plant contact. Pertinent negatives include no congestion, cough, diarrhea, eye pain, facial edema, fever, joint pain, rhinorrhea, shortness of breath, sore throat or vomiting. Past treatments include nothing. Her past medical history is significant for allergies.  Negative for new medication, detergent, soap, or body product. No known insect bite or outdoor exposures to plants. No sick contact. Has had similar rash in the past when exposed to leaves of this particular tree in her yard. She was cutting some branches, did not use appropriate clothing and leaves came in contact with ehr skin.  Hypertension: Last follow-up in 12/2020. Currently she is on amlodipine-olmesartan 5-20 mg daily. She has tolerated medication well. Negative for unusual or severe headache, visual changes, exertional chest pain, dyspnea,  focal weakness, or edema.  Lab Results  Component Value Date   CREATININE 0.73 12/11/2020   BUN 17 12/11/2020   NA 142 12/11/2020   K 3.5 12/11/2020   CL 106 12/11/2020   CO2 28 12/11/2020   Anxiety and depression: Currently she is on sertraline 50 mg daily and alprazolam 0.25 mg 1/2 to 1 tablet daily as needed. She is tolerating medication well. Symptoms have not worsened.    08/25/2021    3:46 PM 03/30/2021    7:08 AM 08/24/2020    1:12 PM 08/07/2017   11:45 AM 08/07/2017   10:22 AM   Depression screen PHQ 2/9  Decreased Interest 0 3 2 0 0  Down, Depressed, Hopeless 0 3 2 0 0  PHQ - 2 Score 0 6 4 0 0  Altered sleeping 0 0 2 0   Tired, decreased energy 0 3 2 0   Change in appetite 0 2 0 0   Feeling bad or failure about yourself  0 0 0 0   Trouble concentrating 0 0 3 0   Moving slowly or fidgety/restless 0 0 3 0   Suicidal thoughts 0 0 0 0   PHQ-9 Score 0 11 14 0   Difficult doing work/chores Not difficult at all Very difficult Somewhat difficult     Review of Systems  Constitutional:  Negative for fever.  HENT:  Negative for congestion, rhinorrhea and sore throat.   Eyes:  Negative for pain and visual disturbance.  Respiratory:  Negative for cough and shortness of breath.   Cardiovascular:  Negative for chest pain, palpitations and leg swelling.  Gastrointestinal:  Negative for diarrhea and vomiting.  Genitourinary:  Negative for decreased urine volume and hematuria.  Musculoskeletal:  Negative for gait problem, joint pain and myalgias.  Skin:  Positive for rash.  Allergic/Immunologic: Positive for environmental allergies.  Neurological:  Negative for syncope, facial asymmetry, weakness and numbness.  Rest see pertinent positives and negatives per HPI.  Current Outpatient Medications on File Prior to Visit  Medication Sig Dispense Refill   ALPRAZolam (XANAX) 0.25 MG tablet Take 0.5-1 tablets (0.125-0.25 mg total)  by mouth daily as needed for anxiety. 20 tablet 0   amLODipine-olmesartan (AZOR) 5-20 MG tablet Take 1 tablet by mouth daily. 90 tablet 2   atorvastatin (LIPITOR) 40 MG tablet Take 1 tablet (40 mg total) by mouth daily. 90 tablet 2   diclofenac sodium (VOLTAREN) 1 % GEL Apply 4 g topically 4 (four) times daily. 150 g 1   mometasone (NASONEX) 50 MCG/ACT nasal spray PLACE 2 SPRAYS INTO THE NOSE DAILY. 17 g 0   sertraline (ZOLOFT) 50 MG tablet Take 1 tablet (50 mg total) by mouth daily. 90 tablet 2   valACYclovir (VALTREX) 1000 MG tablet TAKE 2 TABLETS  BY MOUTH TWICE DAILY FOR 1 DAY. TAKE AT FIST SIGNS OF OUTBREAK. 30 tablet 1   fluticasone (FLONASE) 50 MCG/ACT nasal spray PLACE 2 SPRAYS IN EACH NOSTRIL DAILY 16 g 6   No current facility-administered medications on file prior to visit.   Past Medical History:  Diagnosis Date   Acid reflux    Acute lateral meniscus tear of right knee    Acute medial meniscus tear of right knee    Anxiety    Blood in stool    GERD (gastroesophageal reflux disease)    History of kidney stones    Hypercholesteremia    Hyperlipidemia    Hypertension    Hypertension    Allergies  Allergen Reactions   Apple Juice Anaphylaxis   Latex Anaphylaxis    Latex condom   Chlorhexidine Gluconate Itching   Flounder [Fish Allergy] Swelling   Other Swelling   Oxycodone-Acetaminophen Other (See Comments)    "knocked me out" Other reaction(s): Other (See Comments) "knocked me out"   Peanut Butter Flavor Itching   Social History   Socioeconomic History   Marital status: Married    Spouse name: Not on file   Number of children: Not on file   Years of education: Not on file   Highest education level: Not on file  Occupational History   Not on file  Tobacco Use   Smoking status: Never   Smokeless tobacco: Never  Substance and Sexual Activity   Alcohol use: Yes    Comment: social   Drug use: No   Sexual activity: Not on file  Other Topics Concern   Not on file  Social History Narrative   Not on file   Social Determinants of Health   Financial Resource Strain: Not on file  Food Insecurity: Not on file  Transportation Needs: Not on file  Physical Activity: Not on file  Stress: Not on file  Social Connections: Not on file   Vitals:   08/25/21 1523  BP: 122/80  Pulse: 63  Resp: 16  SpO2: 98%   Body mass index is 24.45 kg/m.  Physical Exam Vitals and nursing note reviewed.  Constitutional:      General: She is not in acute distress.    Appearance: She is well-developed.  HENT:      Head: Normocephalic and atraumatic.     Mouth/Throat:     Mouth: Mucous membranes are moist.     Pharynx: Oropharynx is clear.  Eyes:     Conjunctiva/sclera: Conjunctivae normal.  Cardiovascular:     Rate and Rhythm: Normal rate and regular rhythm.     Heart sounds: No murmur heard. Pulmonary:     Effort: Pulmonary effort is normal. No respiratory distress.     Breath sounds: Normal breath sounds.  Lymphadenopathy:     Cervical: No cervical adenopathy.  Skin:  General: Skin is warm.     Findings: Erythema and rash present. Rash is papular and vesicular.          Comments: Vesicular confluent,linear pattern on left hand (dorsum) extending to wrist. Left upper back and one small area on right forearm.  Neurological:     General: No focal deficit present.     Mental Status: She is alert and oriented to person, place, and time.     Gait: Gait normal.  Psychiatric:     Comments: Well groomed, good eye contact.   ASSESSMENT AND PLAN:  Ms. Triniti was seen today for rash.  Diagnoses and all orders for this visit:  Contact dermatitis due to plants, except food, unspecified contact dermatitis type ? Poison ivy vs poison oak. Ideally try to avoid contact with trigger factors. Because of the extension of rash, recommend oral prednisone, starting tomorrow and with breakfast 2 tablets daily for 3 to 5 days. Topical triamcinolone twice daily for 14 days during acute episodes and then as needed.  -     predniSONE (DELTASONE) 20 MG tablet; Take 2 tablets (40 mg total) by mouth daily with breakfast for 5 days -     triamcinolone cream (KENALOG) 0.1 %; Apply 1 application topically daily as needed.  Anxiety disorder Problem is stable. Continue sertraline 50 mg daily and alprazolam 0.25 mg 1/2 to 1 tablet daily as needed.  Essential hypertension BP adequately controlled. Continue amlodipine-olmesartan 5-20 mg daily. Continue low-salt diet.  Recurrent major depression in partial  remission (HCC) PHQ score has improved. Continue sertraline 50 mg daily.  Return in about 6 months (around 02/25/2022).  Jerzy Roepke G. [place], MD  [facility]. [place] office.

## 2021-08-25 NOTE — Patient Instructions (Signed)
A few things to remember from today's visit:   Contact dermatitis due to plants, except food, unspecified contact dermatitis type - Plan: predniSONE (DELTASONE) 20 MG tablet  If you need refills please call your pharmacy. Do not use My Chart to request refills or for acute issues that need immediate attention.   Contact Dermatitis Dermatitis is redness, soreness, and swelling (inflammation) of the skin. Contact dermatitis is a reaction to something that touches the skin. There are two types of contact dermatitis: Irritant contact dermatitis. This happens when something bothers (irritates) your skin, like soap. Allergic contact dermatitis. This is caused when you are exposed to something that you are allergic to, such as poison ivy. What are the causes? Common causes of irritant contact dermatitis include: Makeup. Soaps. Detergents. Bleaches. Acids. Metals, such as nickel. Common causes of allergic contact dermatitis include: Plants. Chemicals. Jewelry. Latex. Medicines. Preservatives in products, such as clothing. What increases the risk? Having a job that exposes you to things that bother your skin. Having asthma or eczema. What are the signs or symptoms? Symptoms may happen anywhere the irritant has touched your skin. Symptoms include: Dry or flaky skin. Redness. Cracks. Itching. Pain or a burning feeling. Blisters. Blood or clear fluid draining from skin cracks. With allergic contact dermatitis, swelling may occur. This may happen in places such as the eyelids, mouth, or genitals. How is this treated? This condition is treated by checking for the cause of the reaction and protecting your skin. Treatment may also include: Steroid creams, ointments, or medicines. Antibiotic medicines or other ointments, if you have a skin infection. Lotion or medicines to help with itching. A bandage (dressing). Follow these instructions at home: Skin care Moisturize your skin as  needed. Put cool cloths on your skin. Put a baking soda paste on your skin. Stir water into baking soda until it looks like a paste. Do not scratch your skin. Avoid having things rub up against your skin. Avoid the use of soaps, perfumes, and dyes. Medicines Take or apply over-the-counter and prescription medicines only as told by your doctor. If you were prescribed an antibiotic medicine, take or apply it as told by your doctor. Do not stop using it even if your condition starts to get better. Bathing Take a bath with: Epsom salts. Baking soda. Colloidal oatmeal. Bathe less often. Bathe in warm water. Avoid using hot water. Bandage care If you were given a bandage, change it as told by your doctor. Wash your hands with soap and water before and after you change your bandage. If soap and water are not available, use hand sanitizer. General instructions Avoid the things that caused your reaction. If you do not know what caused it, keep a journal. Write down: What you eat. What skin products you use. What you drink. What you wear in the area that has symptoms. This includes jewelry. Check the affected areas every day for signs of infection. Check for: More redness, swelling, or pain. More fluid or blood. Warmth. Pus or a bad smell. Keep all follow-up visits as told by your doctor. This is important. Contact a doctor if: You do not get better with treatment. Your condition gets worse. You have signs of infection, such as: More swelling. Tenderness. More redness. Soreness. Warmth. You have a fever. You have new symptoms. Get help right away if: You have a very bad headache. You have neck pain. Your neck is stiff. You throw up (vomit). You feel very sleepy. You see red streaks  coming from the area. Your bone or joint near the area hurts after the skin has healed. The area turns darker. You have trouble breathing. Summary Dermatitis is redness, soreness, and swelling of  the skin. Symptoms may occur where the irritant has touched you. Treatment may include medicines and skin care. If you do not know what caused your reaction, keep a journal. Contact a doctor if your condition gets worse or you have signs of infection. This information is not intended to replace advice given to you by your health care provider. Make sure you discuss any questions you have with your health care provider. Document Revised: 01/04/2021 Document Reviewed: 01/04/2021 Elsevier Patient Education  2023 Elsevier Inc.  Please be sure medication list is accurate. If a new problem present, please set up appointment sooner than planned today.

## 2021-08-25 NOTE — Assessment & Plan Note (Signed)
BP adequately controlled. ?Continue amlodipine-olmesartan 5-20 mg daily. ?Continue low-salt diet. ?

## 2021-10-19 ENCOUNTER — Other Ambulatory Visit (HOSPITAL_COMMUNITY): Payer: Self-pay

## 2021-10-19 MED ORDER — DOXYCYCLINE HYCLATE 20 MG PO TABS
20.0000 mg | ORAL_TABLET | Freq: Two times a day (BID) | ORAL | 5 refills | Status: DC
  Filled 2021-10-19: qty 60, 30d supply, fill #0
  Filled 2021-12-19: qty 60, 30d supply, fill #1

## 2021-10-19 MED ORDER — CHLORHEXIDINE GLUCONATE 0.12 % MT SOLN
15.0000 mL | Freq: Two times a day (BID) | OROMUCOSAL | 2 refills | Status: DC
  Filled 2021-10-19 (×2): qty 473, 16d supply, fill #0
  Filled 2021-12-19: qty 473, 16d supply, fill #1

## 2021-10-20 ENCOUNTER — Other Ambulatory Visit (HOSPITAL_COMMUNITY): Payer: Self-pay

## 2021-11-11 ENCOUNTER — Telehealth: Payer: Self-pay

## 2021-11-11 NOTE — Telephone Encounter (Signed)
Caller states she has a peanut allergy and ate a cookie on Tues that is causing an allergic reaction. She has a itchy rash on her arms.  11/11/2021 11:19:25 AM See PCP within 24 Hours Carmon, RN, Denise  Pt has appt with Nafziger on 11/12/21.

## 2021-11-12 ENCOUNTER — Encounter: Payer: Self-pay | Admitting: Adult Health

## 2021-11-12 ENCOUNTER — Other Ambulatory Visit (HOSPITAL_COMMUNITY): Payer: Self-pay

## 2021-11-12 ENCOUNTER — Ambulatory Visit (INDEPENDENT_AMBULATORY_CARE_PROVIDER_SITE_OTHER): Payer: 59 | Admitting: Adult Health

## 2021-11-12 VITALS — BP 150/80 | HR 65 | Temp 98.4°F | Ht 61.0 in | Wt 131.0 lb

## 2021-11-12 DIAGNOSIS — L237 Allergic contact dermatitis due to plants, except food: Secondary | ICD-10-CM

## 2021-11-12 MED ORDER — PREDNISONE 20 MG PO TABS
20.0000 mg | ORAL_TABLET | Freq: Every day | ORAL | 0 refills | Status: DC
  Filled 2021-11-12: qty 10, 10d supply, fill #0

## 2021-11-12 MED ORDER — METHYLPREDNISOLONE ACETATE 80 MG/ML IJ SUSP: 80.0000 mg | Freq: Once | INTRAMUSCULAR | Status: DC

## 2021-11-12 MED ORDER — METHYLPREDNISOLONE ACETATE 40 MG/ML IJ SUSP: 40.0000 mg | Freq: Once | INTRAMUSCULAR | Status: DC

## 2021-11-12 MED ORDER — METHYLPREDNISOLONE ACETATE 80 MG/ML IJ SUSP
80.0000 mg | Freq: Once | INTRAMUSCULAR | Status: AC
  Administered 2021-11-12: 80 mg via INTRAMUSCULAR

## 2021-11-12 MED ORDER — HYDROXYZINE PAMOATE 50 MG PO CAPS
50.0000 mg | ORAL_CAPSULE | Freq: Three times a day (TID) | ORAL | 0 refills | Status: AC | PRN
  Filled 2021-11-12: qty 30, 10d supply, fill #0

## 2021-11-12 MED ORDER — METHYLPREDNISOLONE ACETATE 40 MG/ML IJ SUSP
40.0000 mg | Freq: Once | INTRAMUSCULAR | Status: AC
  Administered 2021-11-12: 40 mg via INTRAMUSCULAR

## 2021-11-12 NOTE — Patient Instructions (Signed)
Health Maintenance Due  Topic Date Due   Zoster Vaccines- Shingrix (1 of 2) Never done   MAMMOGRAM  10/30/2019   PAP SMEAR-Modifier  08/07/2020   Fecal DNA (Cologuard)  09/06/2020   TETANUS/TDAP  04/04/2021   INFLUENZA VACCINE  11/02/2021      Row Labels 08/25/2021    3:46 PM 03/30/2021    7:08 AM 08/24/2020    1:12 PM  Depression screen PHQ 2/9   Section Header. No data exists in this row.     Decreased Interest   0 3 2  Down, Depressed, Hopeless   0 3 2  PHQ - 2 Score   0 6 4  Altered sleeping   0 0 2  Tired, decreased energy   0 3 2  Change in appetite   0 2 0  Feeling bad or failure about yourself    0 0 0  Trouble concentrating   0 0 3  Moving slowly or fidgety/restless   0 0 3  Suicidal thoughts   0 0 0  PHQ-9 Score   0 11 14  Difficult doing work/chores   Not difficult at all Very difficult Somewhat difficult

## 2021-11-12 NOTE — Progress Notes (Signed)
Subjective:    Patient ID: Alice Ryan, female    DOB: 11-24-1960, 61 y.o.   MRN: [id number]  HPI 61 year old female who  has a past medical history of Acid reflux, Acute lateral meniscus tear of right knee, Acute medial meniscus tear of right knee, Anxiety, Blood in stool, GERD (gastroesophageal reflux disease), History of kidney stones, Hypercholesteremia, Hyperlipidemia, Hypertension, and Hypertension.  She presents to the office today for.  Symptoms started 2 to 3 days ago.  She reports that she believes she ate cookie that had some type of peanut extract and it, that day or the next day she started to develop burning, redness, and itching on her right arm.  This is since spread to her abdomen as well as some areas on her left arm.  She has developed vesicles throughout her abdomen and left arm as well.  Additionally, she had this reaction a few months ago after doing yard work and believes that she got into some poison ivy; a day or 2 before this outbreak she was working on [employer] that she used previously.  At home she has been applying a topical steroid cream without improvement  Denies shortness of breath, difficulty breathing, or swallowing.   Review of Systems See HPI   Past Medical History:  Diagnosis Date   Acid reflux    Acute lateral meniscus tear of right knee    Acute medial meniscus tear of right knee    Anxiety    Blood in stool    GERD (gastroesophageal reflux disease)    History of kidney stones    Hypercholesteremia    Hyperlipidemia    Hypertension    Hypertension     Social History   Socioeconomic History   Marital status: Married    Spouse name: Not on file   Number of children: Not on file   Years of education: Not on file   Highest education level: Not on file  Occupational History   Not on file  Tobacco Use   Smoking status: Never   Smokeless tobacco: Never  Substance and Sexual Activity   Alcohol use: Yes    Comment: social    Drug use: No   Sexual activity: Not on file  Other Topics Concern   Not on file  Social History Narrative   Not on file   Social Determinants of Health   Financial Resource Strain: Not on file  Food Insecurity: Not on file  Transportation Needs: Not on file  Physical Activity: Not on file  Stress: Not on file  Social Connections: Not on file  Intimate Partner Violence: Not on file    Past Surgical History:  Procedure Laterality Date   BREAST EXCISIONAL BIOPSY Left 2011   benign   BREAST LUMPECTOMY WITH RADIOACTIVE SEED LOCALIZATION Right 12/13/2018   Procedure: RIGHT BREAST LUMPECTOMY WITH RADIOACTIVE SEED LOCALIZATION;  Surgeon: Harriette Bouillon, MD;  Location: [facility];  Service: General;  Laterality: Right;   CHONDROPLASTY Right 06/25/2019   Procedure: CHONDROPLASTY;  Surgeon: Salvatore Marvel, MD;  Location: [facility];  Service: Orthopedics;  Laterality: Right;   CYST EXCISION PERINEAL N/A 06/13/2016   Procedure: CYST EXCISION PERIANAL, CHRONIC INFECTED;  Surgeon: Jimmye Norman, MD;  Location: [facility];  Service: General;  Laterality: N/A;   EVALUATION UNDER ANESTHESIA WITH HEMORRHOIDECTOMY N/A 06/13/2016   Procedure: EXAM UNDER ANESTHESIA,  HEMORRHOIDECTOMY;  Surgeon: Jimmye Norman, MD;  Location: [facility];  Service: General;  Laterality: N/A;   KNEE ARTHROSCOPY WITH MEDIAL MENISECTOMY Right 06/25/2019   Procedure: KNEE ARTHROSCOPY WITH MEDIAL MENISECTOMY;  Surgeon: Salvatore Marvel, MD;  Location: [facility];  Service: Orthopedics;  Laterality: Right;   lumpectomy right breast  2011   benign   TRIGGER FINGER RELEASE Right 07/31/2017   Procedure: RELEASE TRIGGER FINGER/A-1 PULLEY RIGHT RING FINGER;  Surgeon: Betha Loa, MD;  Location: [facility];  Service: Orthopedics;  Laterality: Right;    Family History  Problem Relation Age of Onset   Cancer Mother        breast cancer at age 37    Stroke Mother    Hypertension Mother    Breast cancer Mother 28   Hypertension Father     Allergies  Allergen Reactions   Apple Juice Anaphylaxis   Latex Anaphylaxis    Latex condom   Flounder [Fish Allergy] Swelling   Other Swelling   Oxycodone-Acetaminophen Other (See Comments)    "knocked me out" Other reaction(s): Other (See Comments) "knocked me out"   Peanut Butter Flavor Itching    Current Outpatient Medications on File Prior to Visit  Medication Sig Dispense Refill   ALPRAZolam (XANAX) 0.25 MG tablet Take 0.5-1 tablets (0.125-0.25 mg total) by mouth daily as needed for anxiety. 20 tablet 0   amLODipine-olmesartan (AZOR) 5-20 MG tablet Take 1 tablet by mouth daily. 90 tablet 2   atorvastatin (LIPITOR) 40 MG tablet Take 1 tablet (40 mg total) by mouth daily. 90 tablet 2   chlorhexidine (PERIDEX) 0.12 % solution Swish 15 mLs in mouth for 30 seconds 2 (two) times daily after brushing teeth, then spit out 473 mL 2   diclofenac sodium (VOLTAREN) 1 % GEL Apply 4 g topically 4 (four) times daily. 150 g 1   doxycycline (PERIOSTAT) 20 MG tablet Take 1 tablet (20 mg total) by mouth 2 (two) times daily for periodontal infection until medicine is gone. 60 tablet 5   mometasone (NASONEX) 50 MCG/ACT nasal spray PLACE 2 SPRAYS INTO THE NOSE DAILY. 17 g 0   sertraline (ZOLOFT) 50 MG tablet Take 1 tablet (50 mg total) by mouth daily. 90 tablet 2   triamcinolone cream (KENALOG) 0.1 % Apply 1 application topically daily as needed. 45 g 1   valACYclovir (VALTREX) 1000 MG tablet TAKE 2 TABLETS BY MOUTH TWICE DAILY FOR 1 DAY. TAKE AT FIST SIGNS OF OUTBREAK. 30 tablet 1   fluticasone (FLONASE) 50 MCG/ACT nasal spray PLACE 2 SPRAYS IN EACH NOSTRIL DAILY 16 g 6   No current facility-administered medications on file prior to visit.    BP (!) 150/80   Pulse 65   Temp 98.4 F (36.9 C) (Oral)   Ht 5\' 1"  (1.549 m)   Wt 131 lb (59.4 kg)   LMP 09/02/2013   SpO2 98%   BMI 24.75 kg/m        Objective:   Physical Exam Vitals and nursing note reviewed.  Constitutional:      Appearance: Normal appearance.  Cardiovascular:     Rate and Rhythm: Normal rate and regular rhythm.     Pulses: Normal pulses.     Heart sounds: Normal heart sounds.  Pulmonary:     Effort: Pulmonary effort is normal.     Breath sounds: Normal breath sounds.  Musculoskeletal:        General: Normal range of motion.  Skin:    General: Skin is warm and dry.     Capillary  Refill: Capillary refill takes less than 2 seconds.     Findings: Erythema and rash present. Rash is vesicular.          Comments: Vesicular areas in linear fashion noted on right forearm, abdomen, and on left forearm.  Neurological:     General: No focal deficit present.     Mental Status: She is alert and oriented to person, place, and time.  Psychiatric:        Mood and Affect: Mood normal.        Behavior: Behavior normal.        Thought Content: Thought content normal.        Judgment: Judgment normal.       Assessment & Plan:  1. Allergic contact dermatitis due to plants, except food -Peers to be contact dermatitis from a plant or poison ivy.  Will give Depo-Medrol injection today to help facilitate healing and then prednisone 20 mg daily x 10 days to prevent rebound.  Will also send in Atarax that she can take to help with itching.  She was advised that this may cause drowsiness - methylPREDNISolone acetate (DEPO-MEDROL) injection 80 mg - methylPREDNISolone acetate (DEPO-MEDROL) injection 40 mg - predniSONE (DELTASONE) 20 MG tablet; Take 1 tablet (20 mg total) by mouth daily with breakfast.  Dispense: 10 tablet; Refill: 0  Shirline Frees, NP

## 2021-12-14 NOTE — Progress Notes (Signed)
HPI: Alice Ryan is a 61 y.o. female, who is here today for her routine physical.  Last CPE: 12/03/19.  She has not been consistent with regular exercise. She is active with yard work. She does not cook, eats mainly salad and chicken wings. Never smoker.  Chronic medical problems: HTN,allergic rhinitis,HLD,and anxiety among some.  Immunization History  Administered Date(s) Administered   Influenza Split 01/09/2012   Influenza,inj,Quad PF,6+ Mos 12/28/2012, 01/14/2016, 01/08/2019, 12/25/2019, 12/15/2021   Pneumococcal Polysaccharide-23 04/05/2011   Tdap 04/05/2011   M: 15 G 4 L4 LMP 8 years ago.  Pap smear:08/07/2017 Adequacy Satisfactory for evaluation  endocervical/transformation zone component PRESENT.   Diagnosis NEGATIVE FOR INTRAEPITHELIAL LESIONS OR MALIGNANCY.   HPV NOT DETECTED   Comment: Normal Reference Range - NOT Detected  Material Submitted CervicoVaginal Pap [ThinPrep Imaged]   Health Maintenance  Topic Date Due   Zoster Vaccines- Shingrix (1 of 2) Never done   MAMMOGRAM  10/30/2019   PAP SMEAR-Modifier  08/07/2020   Fecal DNA (Cologuard)  09/06/2020   TETANUS/TDAP  04/04/2021   HIV Screening  07/09/2024 (Originally 07/06/1975)   Hepatitis C Screening  05/31/2025 (Originally 07/06/1978)   INFLUENZA VACCINE  Completed   HPV VACCINES  Aged Out   HTN on Azor 5-20 mg daily. Anxiety on Sertraline 50 mg daily.  HLD on Atorvastatin 40 mg daily. Component     Latest Ref Rng 12/11/2020  HDL Cholesterol     >39.00 mg/dL 12.87   Cholesterol     0 - 200 mg/dL 867 (H)   Triglycerides     0.0 - 149.0 mg/dL 67.2   VLDL     0.0 - 09.4 mg/dL 70.9   LDL (calc)     0 - 99 mg/dL 628 (H)   Total CHOL/HDL Ratio 3   NonHDL 149.58     Review of Systems  Constitutional:  Negative for appetite change and fever.  HENT:  Negative for hearing loss, mouth sores, sore throat and trouble swallowing.   Eyes:  Negative for redness and visual disturbance.   Respiratory:  Negative for cough, shortness of breath and wheezing.   Cardiovascular:  Negative for chest pain and leg swelling.  Gastrointestinal:  Negative for abdominal pain, nausea and vomiting.       No changes in bowel habits.  Endocrine: Negative for cold intolerance, heat intolerance, polydipsia, polyphagia and polyuria.  Genitourinary:  Negative for decreased urine volume, dysuria, hematuria, vaginal bleeding and vaginal discharge.  Musculoskeletal:  Positive for arthralgias. Negative for gait problem.  Skin:  Negative for color change and rash.  Allergic/Immunologic: Negative for environmental allergies.  Neurological:  Negative for syncope, weakness and headaches.  Hematological:  Negative for adenopathy. Does not bruise/bleed easily.  Psychiatric/Behavioral:  Negative for confusion. The patient is nervous/anxious.   All other systems reviewed and are negative.  Current Outpatient Medications on File Prior to Visit  Medication Sig Dispense Refill   ALPRAZolam (XANAX) 0.25 MG tablet Take 0.5-1 tablets (0.125-0.25 mg total) by mouth daily as needed for anxiety. 20 tablet 0   amLODipine-olmesartan (AZOR) 5-20 MG tablet Take 1 tablet by mouth daily. 90 tablet 2   atorvastatin (LIPITOR) 40 MG tablet Take 1 tablet (40 mg total) by mouth daily. 90 tablet 2   chlorhexidine (PERIDEX) 0.12 % solution Swish 15 mLs in mouth for 30 seconds 2 (two) times daily after brushing teeth, then spit out 473 mL 2   diclofenac sodium (VOLTAREN) 1 % GEL Apply 4 g topically  4 (four) times daily. 150 g 1   doxycycline (PERIOSTAT) 20 MG tablet Take 1 tablet (20 mg total) by mouth 2 (two) times daily for periodontal infection until medicine is gone. 60 tablet 5   mometasone (NASONEX) 50 MCG/ACT nasal spray PLACE 2 SPRAYS INTO THE NOSE DAILY. 17 g 0   sertraline (ZOLOFT) 50 MG tablet Take 1 tablet (50 mg total) by mouth daily. 90 tablet 2   triamcinolone cream (KENALOG) 0.1 % Apply 1 application topically  daily as needed. 45 g 1   valACYclovir (VALTREX) 1000 MG tablet TAKE 2 TABLETS BY MOUTH TWICE DAILY FOR 1 DAY. TAKE AT FIST SIGNS OF OUTBREAK. 30 tablet 1   fluticasone (FLONASE) 50 MCG/ACT nasal spray PLACE 2 SPRAYS IN EACH NOSTRIL DAILY 16 g 6   No current facility-administered medications on file prior to visit.   Past Medical History:  Diagnosis Date   Acid reflux    Acute lateral meniscus tear of right knee    Acute medial meniscus tear of right knee    Anxiety    Blood in stool    GERD (gastroesophageal reflux disease)    History of kidney stones    Hypercholesteremia    Hyperlipidemia    Hypertension    Hypertension    Past Surgical History:  Procedure Laterality Date   BREAST EXCISIONAL BIOPSY Left 2011   benign   BREAST LUMPECTOMY WITH RADIOACTIVE SEED LOCALIZATION Right 12/13/2018   Procedure: RIGHT BREAST LUMPECTOMY WITH RADIOACTIVE SEED LOCALIZATION;  Surgeon: Harriette Bouillon, MD;  Location: [facility];  Service: General;  Laterality: Right;   CHONDROPLASTY Right 06/25/2019   Procedure: CHONDROPLASTY;  Surgeon: Salvatore Marvel, MD;  Location: [facility];  Service: Orthopedics;  Laterality: Right;   CYST EXCISION PERINEAL N/A 06/13/2016   Procedure: CYST EXCISION PERIANAL, CHRONIC INFECTED;  Surgeon: Jimmye Norman, MD;  Location: [facility];  Service: General;  Laterality: N/A;   EVALUATION UNDER ANESTHESIA WITH HEMORRHOIDECTOMY N/A 06/13/2016   Procedure: EXAM UNDER ANESTHESIA,  HEMORRHOIDECTOMY;  Surgeon: Jimmye Norman, MD;  Location: [facility];  Service: General;  Laterality: N/A;   KNEE ARTHROSCOPY WITH MEDIAL MENISECTOMY Right 06/25/2019   Procedure: KNEE ARTHROSCOPY WITH MEDIAL MENISECTOMY;  Surgeon: Salvatore Marvel, MD;  Location: [facility];  Service: Orthopedics;  Laterality: Right;   lumpectomy right breast  2011   benign   TRIGGER FINGER RELEASE Right 07/31/2017   Procedure: RELEASE TRIGGER  FINGER/A-1 PULLEY RIGHT RING FINGER;  Surgeon: Betha Loa, MD;  Location: [facility];  Service: Orthopedics;  Laterality: Right;   Allergies  Allergen Reactions   Apple Juice Anaphylaxis   Latex Anaphylaxis    Latex condom   Flounder [Fish Allergy] Swelling   Other Swelling   Oxycodone-Acetaminophen Other (See Comments)    "knocked me out" Other reaction(s): Other (See Comments) "knocked me out"   Peanut Butter Flavor Itching   Family History  Problem Relation Age of Onset   Cancer Mother        breast cancer at age 41   Stroke Mother    Hypertension Mother    Breast cancer Mother 62   Hypertension Father    Social History   Socioeconomic History   Marital status: Married    Spouse name: Not on file   Number of children: Not on file   Years of education: Not on file   Highest education level: Not on file  Occupational History   Not  on file  Tobacco Use   Smoking status: Never   Smokeless tobacco: Never  Substance and Sexual Activity   Alcohol use: Yes    Comment: social   Drug use: No   Sexual activity: Not on file  Other Topics Concern   Not on file  Social History Narrative   Not on file   Social Determinants of Health   Financial Resource Strain: Not on file  Food Insecurity: Not on file  Transportation Needs: Not on file  Physical Activity: Not on file  Stress: Not on file  Social Connections: Not on file   Vitals:   12/15/21 1420  BP: 132/80  Pulse: 64  Resp: 12  Temp: 98.7 F (37.1 C)  SpO2: 99%   Body mass index is 24.56 kg/m. Wt Readings from Last 3 Encounters:  12/15/21 130 lb (59 kg)  11/12/21 131 lb (59.4 kg)  08/25/21 129 lb 6 oz (58.7 kg)  Physical Exam Vitals and nursing note reviewed.  Constitutional:      General: She is not in acute distress.    Appearance: She is well-developed and well-groomed.  HENT:     Head: Normocephalic and atraumatic.     Right Ear: Hearing, tympanic membrane, ear canal and  external ear normal.     Left Ear: Hearing, tympanic membrane, ear canal and external ear normal.     Mouth/Throat:     Mouth: Mucous membranes are moist.     Pharynx: Oropharynx is clear. Uvula midline.  Eyes:     Extraocular Movements: Extraocular movements intact.     Conjunctiva/sclera: Conjunctivae normal.     Pupils: Pupils are equal, round, and reactive to light.  Neck:     Thyroid: No thyromegaly.     Trachea: No tracheal deviation.  Cardiovascular:     Rate and Rhythm: Normal rate and regular rhythm.     Pulses:          Dorsalis pedis pulses are 2+ on the right side and 2+ on the left side.     Heart sounds: No murmur heard. Pulmonary:     Effort: Pulmonary effort is normal. No respiratory distress.     Breath sounds: Normal breath sounds.  Abdominal:     Palpations: Abdomen is soft. There is no hepatomegaly or mass.     Tenderness: There is no abdominal tenderness.  Genitourinary:    Comments: No concerns. Musculoskeletal:     Comments: No signs of synovitis appreciated.  Lymphadenopathy:     Cervical: No cervical adenopathy.     Upper Body:     Right upper body: No supraclavicular adenopathy.     Left upper body: No supraclavicular adenopathy.  Skin:    General: Skin is warm.     Findings: No erythema or rash.  Neurological:     General: No focal deficit present.     Mental Status: She is alert and oriented to person, place, and time.     Cranial Nerves: No cranial nerve deficit.     Coordination: Coordination normal.     Gait: Gait normal.     Deep Tendon Reflexes:     Reflex Scores:      Bicep reflexes are 2+ on the right side and 2+ on the left side.      Patellar reflexes are 2+ on the right side and 2+ on the left side. Psychiatric:        Mood and Affect: Mood and affect normal.   ASSESSMENT AND  PLAN:  Alice Ryan was here today annual physical examination.  Orders Placed This Encounter  Procedures   Flu Vaccine QUAD 57mo+IM  (Fluarix, Fluzone & Alfiuria Quad PF)   Cologuard   Comprehensive metabolic panel   Hemoglobin A1c   Lipid panel   Lab Results  Component Value Date   CREATININE 0.78 12/15/2021   BUN 17 12/15/2021   NA 141 12/15/2021   K 3.9 12/15/2021   CL 106 12/15/2021   CO2 27 12/15/2021   Lab Results  Component Value Date   ALT 17 12/15/2021   AST 18 12/15/2021   ALKPHOS 89 12/15/2021   BILITOT 0.6 12/15/2021   Lab Results  Component Value Date   CHOL 261 (H) 12/15/2021   HDL 97.30 12/15/2021   LDLCALC 152 (H) 12/15/2021   LDLDIRECT 165.0 02/26/2013   TRIG 59.0 12/15/2021   CHOLHDL 3 12/15/2021   Lab Results  Component Value Date   HGBA1C 6.3 12/15/2021   Routine general medical examination at a health care facility We discussed the importance of regular physical activity and healthy diet for prevention of chronic illness and/or complications. Preventive guidelines reviewed. Vaccination: Prefers to hold on shingrix. Influenza vaccine given today.  Ca++ and vit D supplementation recommended. She is going to call and schedule her mammogram.  Next CPE in a year. The 10-year ASCVD risk score (Arnett DK, et al., 2019) is: 6.2%   Values used to calculate the score:     Age: 15 years     Sex: Female     Is Non-Hispanic African American: Yes     Diabetic: No     Tobacco smoker: No     Systolic Blood Pressure: 132 mmHg     Is BP treated: Yes     HDL Cholesterol: 97.3 mg/dL     Total Cholesterol: 261 mg/dL  Need for influenza vaccination -     Flu Vaccine QUAD 58mo+IM (Fluarix, Fluzone & Alfiuria Quad PF)  Hyperlipidemia, unspecified hyperlipidemia type Continue Atorvastatin 40 mg daily. Low fat diet also recommended.  Colon cancer screening -     Cologuard  Screening for endocrine, metabolic and immunity disorder -     Hemoglobin A1c -     Comprehensive metabolic panel  Return in 6 months (on 06/15/2022).  Mayo Faulk G. [place], MD  [facility]. [place]  office.

## 2021-12-15 ENCOUNTER — Encounter: Payer: Self-pay | Admitting: Family Medicine

## 2021-12-15 ENCOUNTER — Ambulatory Visit (INDEPENDENT_AMBULATORY_CARE_PROVIDER_SITE_OTHER): Payer: 59 | Admitting: Family Medicine

## 2021-12-15 VITALS — BP 132/80 | HR 64 | Temp 98.7°F | Resp 12 | Ht 61.0 in | Wt 130.0 lb

## 2021-12-15 DIAGNOSIS — Z23 Encounter for immunization: Secondary | ICD-10-CM | POA: Diagnosis not present

## 2021-12-15 DIAGNOSIS — Z13228 Encounter for screening for other metabolic disorders: Secondary | ICD-10-CM | POA: Diagnosis not present

## 2021-12-15 DIAGNOSIS — Z0000 Encounter for general adult medical examination without abnormal findings: Secondary | ICD-10-CM

## 2021-12-15 DIAGNOSIS — Z130 Encounter for screening for diseases of the blood and blood-forming organs and certain disorders involving the immune mechanism: Secondary | ICD-10-CM

## 2021-12-15 DIAGNOSIS — Z1211 Encounter for screening for malignant neoplasm of colon: Secondary | ICD-10-CM | POA: Diagnosis not present

## 2021-12-15 DIAGNOSIS — Z1329 Encounter for screening for other suspected endocrine disorder: Secondary | ICD-10-CM | POA: Diagnosis not present

## 2021-12-15 DIAGNOSIS — E785 Hyperlipidemia, unspecified: Secondary | ICD-10-CM | POA: Diagnosis not present

## 2021-12-15 NOTE — Patient Instructions (Addendum)
A few things to remember from today's visit:  Routine general medical examination at a health care facility  Need for influenza vaccination - Plan: Flu Vaccine QUAD 33mo+IM (Fluarix, Fluzone & Alfiuria Quad PF)  Hyperlipidemia, unspecified hyperlipidemia type  Colon cancer screening - Plan: Cologuard  Screening for endocrine, metabolic and immunity disorder  If you need refills for medications you take chronically, please call your pharmacy. Do not use My Chart to request refills or for acute issues that need immediate attention. If you send a my chart message, it may take a few days to be addressed, specially if I am not in the office.  Please be sure medication list is accurate. If a new problem present, please set up appointment sooner than planned today.  Health Maintenance, Female Adopting a healthy lifestyle and getting preventive care are important in promoting health and wellness. Ask your health care provider about: Please schedule your mammogram. The right schedule for you to have regular tests and exams. Things you can do on your own to prevent diseases and keep yourself healthy. What should I know about diet, weight, and exercise? Eat a healthy diet  Eat a diet that includes plenty of vegetables, fruits, low-fat dairy products, and lean protein. Do not eat a lot of foods that are high in solid fats, added sugars, or sodium. Maintain a healthy weight Body mass index (BMI) is used to identify weight problems. It estimates body fat based on height and weight. Your health care provider can help determine your BMI and help you achieve or maintain a healthy weight. Get regular exercise Get regular exercise. This is one of the most important things you can do for your health. Most adults should: Exercise for at least 150 minutes each week. The exercise should increase your heart rate and make you sweat (moderate-intensity exercise). Do strengthening exercises at least twice a  week. This is in addition to the moderate-intensity exercise. Spend less time sitting. Even light physical activity can be beneficial. Watch cholesterol and blood lipids Have your blood tested for lipids and cholesterol at 59 years of age, then have this test every 5 years. Have your cholesterol levels checked more often if: Your lipid or cholesterol levels are high. You are older than 59 years of age. You are at high risk for heart disease. What should I know about cancer screening? Depending on your health history and family history, you may need to have cancer screening at various ages. This may include screening for: Breast cancer. Cervical cancer. Colorectal cancer. Skin cancer. Lung cancer. What should I know about heart disease, diabetes, and high blood pressure? Blood pressure and heart disease High blood pressure causes heart disease and increases the risk of stroke. This is more likely to develop in people who have high blood pressure readings or are overweight. Have your blood pressure checked: Every 3-5 years if you are 66-41 years of age. Every year if you are 64 years old or older. Diabetes Have regular diabetes screenings. This checks your fasting blood sugar level. Have the screening done: Once every three years after age 43 if you are at a normal weight and have a low risk for diabetes. More often and at a younger age if you are overweight or have a high risk for diabetes. What should I know about preventing infection? Hepatitis B If you have a higher risk for hepatitis B, you should be screened for this virus. Talk with your health care provider to find out if  you are at risk for hepatitis B infection. Hepatitis C Testing is recommended for: Everyone born from [birth year] through 1963. Anyone with known risk factors for hepatitis C. Sexually transmitted infections (STIs) Get screened for STIs, including gonorrhea and chlamydia, if: You are sexually active and are younger  than 59 years of age. You are older than 59 years of age and your health care provider tells you that you are at risk for this type of infection. Your sexual activity has changed since you were last screened, and you are at increased risk for chlamydia or gonorrhea. Ask your health care provider if you are at risk. Ask your health care provider about whether you are at high risk for HIV. Your health care provider may recommend a prescription medicine to help prevent HIV infection. If you choose to take medicine to prevent HIV, you should first get tested for HIV. You should then be tested every 3 months for as long as you are taking the medicine. Pregnancy If you are about to stop having your period (premenopausal) and you may become pregnant, seek counseling before you get pregnant. Take 400 to 800 micrograms (mcg) of folic acid every day if you become pregnant. Ask for birth control (contraception) if you want to prevent pregnancy. Osteoporosis and menopause Osteoporosis is a disease in which the bones lose minerals and strength with aging. This can result in bone fractures. If you are 54 years old or older, or if you are at risk for osteoporosis and fractures, ask your health care provider if you should: Be screened for bone loss. Take a calcium or vitamin D supplement to lower your risk of fractures. Be given hormone replacement therapy (HRT) to treat symptoms of menopause. Follow these instructions at home: Alcohol use Do not drink alcohol if: Your health care provider tells you not to drink. You are pregnant, may be pregnant, or are planning to become pregnant. If you drink alcohol: Limit how much you have to: 0-1 drink a day. Know how much alcohol is in your drink. In the [place] one drink equals one 12 oz bottle of beer (355 mL), one 5 oz glass of wine (148 mL), or one 1 oz glass of hard liquor (44 mL). Lifestyle Do not use any products that contain nicotine or tobacco. These products  include cigarettes, chewing tobacco, and vaping devices, such as e-cigarettes. If you need help quitting, ask your health care provider. Do not use street drugs. Do not share needles. Ask your health care provider for help if you need support or information about quitting drugs. General instructions Schedule regular health, dental, and eye exams. Stay current with your vaccines. Tell your health care provider if: You often feel depressed. You have ever been abused or do not feel safe at home. Summary Adopting a healthy lifestyle and getting preventive care are important in promoting health and wellness. Follow your health care provider's instructions about healthy diet, exercising, and getting tested or screened for diseases. Follow your health care provider's instructions on monitoring your cholesterol and blood pressure. This information is not intended to replace advice given to you by your health care provider. Make sure you discuss any questions you have with your health care provider. Document Revised: 08/10/2020 Document Reviewed: 08/10/2020 Elsevier Patient Education  2023 [employer].

## 2021-12-16 LAB — COMPREHENSIVE METABOLIC PANEL
ALT: 17 U/L (ref 0–35)
AST: 18 U/L (ref 0–37)
Albumin: 3.9 g/dL (ref 3.5–5.2)
Alkaline Phosphatase: 89 U/L (ref 39–117)
BUN: 17 mg/dL (ref 6–23)
CO2: 27 mEq/L (ref 19–32)
Calcium: 9.5 mg/dL (ref 8.4–10.5)
Chloride: 106 mEq/L (ref 96–112)
Creatinine, Ser: 0.78 mg/dL (ref 0.40–1.20)
GFR: 83.12 mL/min (ref >60.00)
Glucose, Bld: 82 mg/dL (ref 70–99)
Potassium: 3.9 mEq/L (ref 3.5–5.1)
Sodium: 141 mEq/L (ref 135–145)
Total Bilirubin: 0.6 mg/dL (ref 0.2–1.2)
Total Protein: 7.6 g/dL (ref 6.0–8.3)

## 2021-12-16 LAB — LIPID PANEL
Cholesterol: 261 mg/dL — ABNORMAL HIGH (ref 0–200)
HDL: 97.3 mg/dL (ref >39.00)
LDL Cholesterol: 152 mg/dL — ABNORMAL HIGH (ref 0–99)
NonHDL: 163.75
Total CHOL/HDL Ratio: 3
Triglycerides: 59 mg/dL (ref 0.0–149.0)
VLDL: 11.8 mg/dL (ref 0.0–40.0)

## 2021-12-16 LAB — HEMOGLOBIN A1C: Hgb A1c MFr Bld: 6.3 % (ref 4.6–6.5)

## 2021-12-19 ENCOUNTER — Other Ambulatory Visit: Payer: Self-pay | Admitting: Family Medicine

## 2021-12-19 DIAGNOSIS — F419 Anxiety disorder, unspecified: Secondary | ICD-10-CM

## 2021-12-20 ENCOUNTER — Other Ambulatory Visit (HOSPITAL_COMMUNITY): Payer: Self-pay

## 2021-12-20 MED ORDER — ALPRAZOLAM 0.25 MG PO TABS
0.1250 mg | ORAL_TABLET | Freq: Every day | ORAL | 1 refills | Status: DC | PRN
  Filled 2021-12-20: qty 20, 20d supply, fill #0

## 2021-12-20 NOTE — Telephone Encounter (Signed)
-   last OV 12/15/21 - last filled 04/02/21

## 2021-12-21 ENCOUNTER — Other Ambulatory Visit (HOSPITAL_COMMUNITY): Payer: Self-pay

## 2021-12-22 ENCOUNTER — Other Ambulatory Visit (HOSPITAL_COMMUNITY): Payer: Self-pay

## 2021-12-31 ENCOUNTER — Other Ambulatory Visit (HOSPITAL_COMMUNITY): Payer: Self-pay

## 2022-01-13 ENCOUNTER — Other Ambulatory Visit: Payer: Self-pay | Admitting: Family Medicine

## 2022-01-13 DIAGNOSIS — Z9889 Other specified postprocedural states: Secondary | ICD-10-CM

## 2022-02-10 DIAGNOSIS — M1711 Unilateral primary osteoarthritis, right knee: Secondary | ICD-10-CM | POA: Diagnosis not present

## 2022-02-16 ENCOUNTER — Other Ambulatory Visit: Payer: Self-pay | Admitting: Family Medicine

## 2022-02-16 ENCOUNTER — Ambulatory Visit
Admission: RE | Admit: 2022-02-16 | Discharge: 2022-02-16 | Disposition: A | Discharge status: Home / Self Care | Payer: 59 | Source: Ambulatory Visit | Attending: Family Medicine | Admitting: Family Medicine

## 2022-02-16 DIAGNOSIS — Z9889 Other specified postprocedural states: Secondary | ICD-10-CM

## 2022-02-16 DIAGNOSIS — R92323 Mammographic fibroglandular density, bilateral breasts: Secondary | ICD-10-CM | POA: Diagnosis not present

## 2022-02-16 DIAGNOSIS — N6012 Diffuse cystic mastopathy of left breast: Secondary | ICD-10-CM | POA: Diagnosis not present

## 2022-03-16 ENCOUNTER — Ambulatory Visit: Payer: 59 | Admitting: Psychology

## 2022-04-05 ENCOUNTER — Telehealth: Payer: Self-pay

## 2022-04-05 ENCOUNTER — Ambulatory Visit (INDEPENDENT_AMBULATORY_CARE_PROVIDER_SITE_OTHER): Payer: Commercial Managed Care - PPO | Admitting: Psychology

## 2022-04-05 DIAGNOSIS — F331 Major depressive disorder, recurrent, moderate: Secondary | ICD-10-CM | POA: Diagnosis not present

## 2022-04-05 NOTE — Telephone Encounter (Signed)
Patient called in today and would like a return call on changing medication dosing. She said this was a recommendation form her counselor but did not have the name of med.
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
## 2022-04-12 ENCOUNTER — Ambulatory Visit (INDEPENDENT_AMBULATORY_CARE_PROVIDER_SITE_OTHER): Payer: Commercial Managed Care - PPO | Admitting: Psychology

## 2022-04-12 DIAGNOSIS — F331 Major depressive disorder, recurrent, moderate: Secondary | ICD-10-CM

## 2022-04-12 DIAGNOSIS — F411 Generalized anxiety disorder: Secondary | ICD-10-CM | POA: Diagnosis not present
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
## 2022-04-15 ENCOUNTER — Ambulatory Visit: Payer: Commercial Managed Care - PPO | Admitting: Family Medicine

## 2022-04-15 ENCOUNTER — Encounter: Payer: Self-pay | Admitting: Family Medicine

## 2022-04-15 ENCOUNTER — Other Ambulatory Visit (HOSPITAL_COMMUNITY): Payer: Self-pay

## 2022-04-15 VITALS — BP 170/80 | HR 90 | Temp 99.4°F | Ht <= 58 in | Wt 131.9 lb

## 2022-04-15 DIAGNOSIS — T7849XA Other allergy, initial encounter: Secondary | ICD-10-CM

## 2022-04-15 DIAGNOSIS — T7800XD Anaphylactic reaction due to unspecified food, subsequent encounter: Secondary | ICD-10-CM

## 2022-04-15 MED ORDER — TRIAMCINOLONE ACETONIDE 0.1 % EX CREA
1.0000 | TOPICAL_CREAM | Freq: Every day | CUTANEOUS | 1 refills | Status: DC | PRN
  Filled 2022-04-15: qty 45, 30d supply, fill #0
  Filled 2022-08-31: qty 45, 30d supply, fill #1

## 2022-04-15 MED ORDER — EPINEPHRINE 0.3 MG/0.3ML IJ SOAJ
0.3000 mg | INTRAMUSCULAR | 2 refills | Status: AC | PRN
  Filled 2022-04-15: qty 2, 2d supply, fill #0

## 2022-04-15 NOTE — Patient Instructions (Signed)
Zyrtec 10 mg daily, can use 25 mg benadryl at night

## 2022-04-15 NOTE — Progress Notes (Signed)
Established Patient Office Visit  Subjective   Patient ID: Alice Ryan, female    DOB: 1961-01-15  Age: 62 y.o. MRN: [id number]  Chief Complaint  Patient presents with   Facial Swelling    Patient complains of facial puffiness and swelling x3 days, questioned if related to using new body butter with avocado on her face    Pt is reporting new facial swelling an itchiness since using a new cream on her face. Pt reports it had avocado in it and she thinks it might because of that.   Patient does have other food   Current Outpatient Medications  Medication Instructions   ALPRAZolam (XANAX) 0.125-0.25 mg, Oral, Daily PRN   amLODipine-olmesartan (AZOR) 5-20 MG tablet 1 tablet, Oral, Daily   atorvastatin (LIPITOR) 40 mg, Oral, Daily   chlorhexidine (PERIDEX) 0.12 % solution Swish 15 mLs in mouth for 30 seconds 2 (two) times daily after brushing teeth, then spit out   diclofenac sodium (VOLTAREN) 4 g, Topical, 4 times daily   doxycycline (PERIOSTAT) 20 MG tablet Take 1 tablet (20 mg total) by mouth 2 (two) times daily for periodontal infection until medicine is gone.   EPINEPHrine (EPIPEN 2-PAK) 0.3 mg, Intramuscular, As needed   fluticasone (FLONASE) 50 MCG/ACT nasal spray PLACE 2 SPRAYS IN EACH NOSTRIL DAILY   mometasone (NASONEX) 50 MCG/ACT nasal spray 2 sprays, Nasal, Daily   sertraline (ZOLOFT) 50 mg, Oral, Daily   triamcinolone cream (KENALOG) 0.1 % Apply 1 application topically daily as needed.   valACYclovir (VALTREX) 1000 MG tablet TAKE 2 TABLETS BY MOUTH TWICE DAILY FOR 1 DAY. TAKE AT FIST SIGNS OF OUTBREAK.     Patient Active Problem List   Diagnosis Date Noted   Depression, recurrent ([facility] [place]) 08/27/2020   Acute medial meniscus tear of right knee    Anxiety disorder 10/03/2018   Recurrent herpes labialis 08/14/2018   Hyperglycemia 06/30/2015   Hyperlipidemia 06/30/2015   Essential hypertension 07/10/2014   Allergic rhinitis 07/10/2014      Review of Systems   Constitutional:  Negative for diaphoresis and fever.  HENT:  Negative for sore throat.   Eyes:  Negative for redness.  Cardiovascular:  Negative for palpitations and leg swelling.  Musculoskeletal:  Negative for myalgias.  Skin:  Positive for itching and rash.  All other systems reviewed and are negative.     Objective:     BP (!) 170/80 Comment: repeated by Mykal--jaf  Pulse 90   Temp 99.4 F (37.4 C) (Oral)   Ht 3\' 4"  (1.016 m)   Wt 131 lb 14.4 oz (59.8 kg)   LMP 09/02/2013   HC 61" (154.9 cm)   SpO2 99%   BMI 57.96 kg/m  BP Readings from Last 3 Encounters:  04/15/22 (!) 170/80  12/15/21 132/80  11/12/21 (!) 150/80      Physical Exam Vitals reviewed.  Constitutional:      Appearance: Normal appearance. She is normal weight.  Lymphadenopathy:     Cervical: No cervical adenopathy.  Neurological:     Mental Status: She is alert.      Media Information  Document Information  No results found for any visits on 04/15/22.    The 10-year ASCVD risk score (Arnett DK, et al., 2019) is: 12.9%    Assessment & Plan:   Problem List Items Addressed This Visit   None Visit Diagnoses     Allergic reaction to cosmetics    -  Primary   Relevant Medications  Will treat with triamcinolone cream, apply once daily to affected skin and I also advised she start zyrtec 10 mg once daily and she may use 25 mg Benadryl at bedtime. Pt reports she has many allergies and has been to the allergist in the past, states she feels like she is acquiring new allergies that were not present in the past... I suggested that if this continues to happen then we can always send her back for another evaluation.   triamcinolone cream (KENALOG) 0.1 %   Allergy with anaphylaxis due to food, subsequent encounter       Relevant Medications   EPINEPHrine (EPIPEN 2-PAK) 0.3 mg/0.3 mL IJ SOAJ injection   Contact dermatitis due to plants, except food, unspecified contact dermatitis type          Patient has documentation of anaphylaxis to apples and latex in her allergy list. Patient is requesting an epipen to have at home. I have placed an order for this. Of note, pt's BP is high today but she reports she has not taken her BP medication today.  Return in about 2 months (around 06/20/2022) for to follow up with Dr. Martinique at her 6 month visit.    Farrel Conners, MD

## 2022-04-25 ENCOUNTER — Ambulatory Visit (INDEPENDENT_AMBULATORY_CARE_PROVIDER_SITE_OTHER): Payer: Commercial Managed Care - PPO | Admitting: Psychology

## 2022-04-25 DIAGNOSIS — F331 Major depressive disorder, recurrent, moderate: Secondary | ICD-10-CM

## 2022-04-25 DIAGNOSIS — F411 Generalized anxiety disorder: Secondary | ICD-10-CM
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
## 2022-04-25 NOTE — Progress Notes (Signed)
Chief Complaint  Patient presents with   Medical Management of Chronic Issues    Therapist wanted pt to follow up with pcp to discuss medications for depression   HPI: Alice Ryan is a 62 y.o. female with PMHx significant for HTN,allergic rhinitis,HLD,and anxiet here today for follow up on depression and anxiety. She has been seeing therapist regularly and was advised to consider changes in pharmacologic treatment. She is currently on Sertraline 50 mg daily and has been on same med for several years but feels it is no longer effective, particularly after the passing of her spouse a couple years ago.  She mentions that her therapist recommended Lexapro and Celexa as alternative medications for depression and anxiety.  For anxiety she takes Xanax prn, which helps.     04/26/2022    2:42 PM 12/15/2021    2:30 PM 08/25/2021    3:46 PM 03/30/2021    7:08 AM 08/24/2020    1:12 PM  Depression screen PHQ 2/9  Decreased Interest 2 0 0 3 2  Down, Depressed, Hopeless 2 0 0 3 2  PHQ - 2 Score 4 0 0 6 4  Altered sleeping 0 0 0 0 2  Tired, decreased energy 1 0 0 3 2  Change in appetite 0 0 0 2 0  Feeling bad or failure about yourself  0 0 0 0 0  Trouble concentrating 0 0 0 0 3  Moving slowly or fidgety/restless 0 0 0 0 3  Suicidal thoughts 0 0 0 0 0  PHQ-9 Score 5 0 0 11 14  Difficult doing work/chores Not difficult at all Not difficult at all Not difficult at all Very difficult Somewhat difficult   She states that she has been sleeping well overall but occasionally takes Xanax or Benadryl to help with restlessness.  She has been engaging in activities such as painting and fixing up her house, attending her son's high school games, which she enjoys. She enjoys outdoor activities but due to cold weather she has not been doing so but she is opening up her home to sunlight. The patient has experienced a reaction to a cosmetic cream containing avocado, resulting in swollen eyes and  facial breakout. She also suspects an allergy to an ingredient in certain cookies, possibly tree nuts or whey, causing swollen eyes, facial breakout, and red lips. The patient has had allergy testing done in the past but not recently.  HTN on Amlodipine-Olmesartan 5-20 mg daily. Negative for severe/frequent headache, visual changes, chest pain, dyspnea, palpitation,focal weakness, or edema. Lab Results  Component Value Date   CREATININE 0.78 12/15/2021   BUN 17 12/15/2021   NA 141 12/15/2021   K 3.9 12/15/2021   CL 106 12/15/2021   CO2 27 12/15/2021   Allergic rhinitis: She is currently using Nasonex nasal spray daily as needed, which has helped. Requesting refills.  She inquires about vitamin D deficiency, no prior hx.  She has been seeing an orthopedist for knee pain and has received steroid injections, which have not provided lasting relief. She  is considering knee replacement surgery but according to pt, it has been advised to wait on this for now.  Review of Systems  Constitutional:  Positive for fatigue. Negative for chills, fever and unexpected weight change.  HENT:  Positive for rhinorrhea.   Respiratory:  Negative for cough and wheezing.   Gastrointestinal:  Negative for abdominal pain, nausea and vomiting.  Neurological:  Negative for syncope and facial asymmetry.  Psychiatric/Behavioral:  Negative for confusion and hallucinations. The patient is nervous/anxious.   See other pertinent positives and negatives in HPI.  Current Outpatient Medications on File Prior to Visit  Medication Sig Dispense Refill   ALPRAZolam (XANAX) 0.25 MG tablet Take 0.5-1 tablets (0.125-0.25 mg total) by mouth daily as needed for anxiety. 20 tablet 1   amLODipine-olmesartan (AZOR) 5-20 MG tablet Take 1 tablet by mouth daily. 90 tablet 2   atorvastatin (LIPITOR) 40 MG tablet Take 1 tablet (40 mg total) by mouth daily. 90 tablet 2   chlorhexidine (PERIDEX) 0.12 % solution Swish 15 mLs in mouth for  30 seconds 2 (two) times daily after brushing teeth, then spit out 473 mL 2   diclofenac sodium (VOLTAREN) 1 % GEL Apply 4 g topically 4 (four) times daily. 150 g 1   doxycycline (PERIOSTAT) 20 MG tablet Take 1 tablet (20 mg total) by mouth 2 (two) times daily for periodontal infection until medicine is gone. 60 tablet 5   EPINEPHrine (EPIPEN 2-PAK) 0.3 mg/0.3 mL IJ SOAJ injection Inject 0.3 mg into the muscle as needed for anaphylaxis. 2 each 2   triamcinolone cream (KENALOG) 0.1 % Apply 1 application topically daily as needed. 45 g 1   valACYclovir (VALTREX) 1000 MG tablet TAKE 2 TABLETS BY MOUTH TWICE DAILY FOR 1 DAY. TAKE AT FIST SIGNS OF OUTBREAK. 30 tablet 1   No current facility-administered medications on file prior to visit.   Past Medical History:  Diagnosis Date   Acid reflux    Acute lateral meniscus tear of right knee    Acute medial meniscus tear of right knee    Anxiety    Blood in stool    GERD (gastroesophageal reflux disease)    History of kidney stones    Hypercholesteremia    Hyperlipidemia    Hypertension    Hypertension    Allergies  Allergen Reactions   Apple Juice Anaphylaxis   Latex Anaphylaxis    Latex condom   Flounder [Fish Allergy] Swelling   Other Swelling   Oxycodone-Acetaminophen Other (See Comments)    "knocked me out" Other reaction(s): Other (See Comments) "knocked me out"   Peanut Butter Flavor Itching   Social History   Socioeconomic History   Marital status: Married    Spouse name: Not on file   Number of children: Not on file   Years of education: Not on file   Highest education level: Not on file  Occupational History   Not on file  Tobacco Use   Smoking status: Never   Smokeless tobacco: Never  Substance and Sexual Activity   Alcohol use: Yes    Comment: social   Drug use: No   Sexual activity: Not on file  Other Topics Concern   Not on file  Social History Narrative   Not on file   Social Determinants of Health    Financial Resource Strain: Not on file  Food Insecurity: Not on file  Transportation Needs: Not on file  Physical Activity: Not on file  Stress: Not on file  Social Connections: Not on file   Vitals:   04/26/22 1435  BP: 136/72  Pulse: 75  Resp: 16  Temp: 98.1 F (36.7 C)  SpO2: 98%   Body mass index is 24.61 kg/m.  Physical Exam Vitals and nursing note reviewed.  Constitutional:      General: She is not in acute distress.    Appearance: She is well-developed.  HENT:     Head: Normocephalic  and atraumatic.  Eyes:     Conjunctiva/sclera: Conjunctivae normal.  Cardiovascular:     Rate and Rhythm: Normal rate and regular rhythm.     Heart sounds: No murmur heard. Pulmonary:     Effort: Pulmonary effort is normal. No respiratory distress.     Breath sounds: Normal breath sounds.  Abdominal:     Palpations: Abdomen is soft. There is no hepatomegaly or mass.     Tenderness: There is no abdominal tenderness.  Lymphadenopathy:     Cervical: No cervical adenopathy.  Skin:    General: Skin is warm.     Findings: No erythema or rash.  Neurological:     Mental Status: She is alert and oriented to person, place, and time.     Cranial Nerves: No cranial nerve deficit.     Gait: Gait normal.  Psychiatric:     Comments: Well groomed, good eye contact.   ASSESSMENT AND PLAN:  Alice Ryan was seen today for medical management of chronic issues.  Diagnoses and all orders for this visit: Allergic rhinitis, unspecified seasonality, unspecified trigger Assessment & Plan: Problem is stable, Nasonex nasal spray has helped, so continue daily as needed. OTC Zyrtec 10 mg daily during seasons that aggravate problem will also help as well as nasal saline irrigations.  Orders: -     Mometasone Furoate; Place 2 sprays into the nose daily.  Dispense: 17 g; Refill: 6  Essential hypertension Assessment & Plan: BP adequately controlled. Continue amlodipine-olmesartan 5-20 mg daily as  well as low-salt diet.   Depression, major, recurrent, moderate ([place]) Assessment & Plan: She feels like Sertraline is not longer helping. PHQ today 5, it has been as high as 14 in the past. Stop Sertraline and start Lexapro 10 mg daily, if any sign of withdrawal like symptoms, we can resume sertraline lower dose and  wean off. Instructed about warning signs.  Orders: -     Escitalopram Oxalate; Take 1 tablet (10 mg total) by mouth daily.  Dispense: 30 tablet; Refill: 1  GAD (generalized anxiety disorder) Assessment & Plan: Continue Alprazolam 0.25 mg daily as needed. Sertraline does not seem to be helping, she is going to stop and start Lexapro 10 mg daily. Continue CBT. F/U in 6 weeks, before if needed.  Orders: -     Escitalopram Oxalate; Take 1 tablet (10 mg total) by mouth daily.  Dispense: 30 tablet; Refill: 1  Return in about 6 weeks (around 06/07/2022).  Alice Chumley G. Martinique, MD  [facility]. [facility] office.

## 2022-04-26 ENCOUNTER — Other Ambulatory Visit (HOSPITAL_COMMUNITY): Payer: Self-pay

## 2022-04-26 ENCOUNTER — Ambulatory Visit: Payer: Commercial Managed Care - PPO | Admitting: Family Medicine

## 2022-04-26 ENCOUNTER — Encounter: Payer: Self-pay | Admitting: Family Medicine

## 2022-04-26 VITALS — BP 136/72 | HR 75 | Temp 98.1°F | Resp 16 | Ht 61.0 in | Wt 130.2 lb

## 2022-04-26 DIAGNOSIS — F411 Generalized anxiety disorder: Secondary | ICD-10-CM

## 2022-04-26 DIAGNOSIS — I10 Essential (primary) hypertension: Secondary | ICD-10-CM | POA: Diagnosis not present

## 2022-04-26 DIAGNOSIS — F331 Major depressive disorder, recurrent, moderate: Secondary | ICD-10-CM | POA: Diagnosis not present

## 2022-04-26 DIAGNOSIS — J309 Allergic rhinitis, unspecified: Secondary | ICD-10-CM

## 2022-04-26 MED ORDER — MOMETASONE FUROATE 50 MCG/ACT NA SUSP
2.0000 | Freq: Every day | NASAL | 6 refills | Status: DC
  Filled 2022-04-26: qty 17, 30d supply, fill #0

## 2022-04-26 MED ORDER — ESCITALOPRAM OXALATE 10 MG PO TABS
10.0000 mg | ORAL_TABLET | Freq: Every day | ORAL | 1 refills | Status: DC
  Filled 2022-04-26: qty 30, 30d supply, fill #0
  Filled 2022-06-07: qty 30, 30d supply, fill #1

## 2022-04-26 NOTE — Assessment & Plan Note (Signed)
BP adequately controlled. Continue amlodipine-olmesartan 5-20 mg daily as well as low-salt diet.

## 2022-04-26 NOTE — Patient Instructions (Addendum)
A few things to remember from today's visit:  Depression, major, recurrent, moderate (HCC) Today Sertraline stopped and Lexapro started. If withdrawal like symptoms we can try weaning off sertraline before starting Lexapro.  For allergies daily Zyrtec 10 mg.   If you need refills for medications you take chronically, please call your pharmacy. Do not use My Chart to request refills or for acute issues that need immediate attention. If you send a my chart message, it may take a few days to be addressed, specially if I am not in the office.  Please be sure medication list is accurate. If a new problem present, please set up appointment sooner than planned today.

## 2022-05-01 NOTE — Assessment & Plan Note (Signed)
Continue Alprazolam 0.25 mg daily as needed. Sertraline does not seem to be helping, she is going to stop and start Lexapro 10 mg daily. Continue CBT. F/U in 6 weeks, before if needed.

## 2022-05-01 NOTE — Assessment & Plan Note (Addendum)
Problem is stable, Nasonex nasal spray has helped, so continue daily as needed. OTC Zyrtec 10 mg daily during seasons that aggravate problem will also help as well as nasal saline irrigations.

## 2022-05-01 NOTE — Assessment & Plan Note (Signed)
She feels like Sertraline is not longer helping. PHQ today 5, it has been as high as 14 in the past. Stop Sertraline and start Lexapro 10 mg daily, if any sign of withdrawal like symptoms, we can resume sertraline lower dose and  wean off. Instructed about warning signs.

## 2022-05-02 ENCOUNTER — Ambulatory Visit: Payer: Commercial Managed Care - PPO | Admitting: Psychology

## 2022-05-11 ENCOUNTER — Ambulatory Visit (INDEPENDENT_AMBULATORY_CARE_PROVIDER_SITE_OTHER): Payer: Commercial Managed Care - PPO | Admitting: Psychology

## 2022-05-11 DIAGNOSIS — F411 Generalized anxiety disorder: Secondary | ICD-10-CM
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
## 2022-05-16 ENCOUNTER — Ambulatory Visit: Payer: Commercial Managed Care - PPO | Admitting: Psychology

## 2022-05-30 ENCOUNTER — Ambulatory Visit (INDEPENDENT_AMBULATORY_CARE_PROVIDER_SITE_OTHER): Payer: Commercial Managed Care - PPO | Admitting: Psychology

## 2022-05-30 DIAGNOSIS — F331 Major depressive disorder, recurrent, moderate: Secondary | ICD-10-CM
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
## 2022-06-07 ENCOUNTER — Other Ambulatory Visit: Payer: Self-pay | Admitting: Family Medicine

## 2022-06-07 ENCOUNTER — Other Ambulatory Visit (HOSPITAL_COMMUNITY): Payer: Self-pay

## 2022-06-07 DIAGNOSIS — F411 Generalized anxiety disorder: Secondary | ICD-10-CM

## 2022-06-07 DIAGNOSIS — F331 Major depressive disorder, recurrent, moderate: Secondary | ICD-10-CM

## 2022-06-08 ENCOUNTER — Other Ambulatory Visit (HOSPITAL_COMMUNITY): Payer: Self-pay

## 2022-06-08 MED ORDER — ESCITALOPRAM OXALATE 10 MG PO TABS
10.0000 mg | ORAL_TABLET | Freq: Every day | ORAL | 1 refills | Status: DC
  Filled 2022-06-08: qty 90, 90d supply, fill #0

## 2022-06-08 MED ORDER — HYDROCODONE-ACETAMINOPHEN 5-325 MG PO TABS
1.0000 | ORAL_TABLET | ORAL | 0 refills | Status: DC
  Filled 2022-06-08: qty 12, 1d supply, fill #0

## 2022-06-13 ENCOUNTER — Ambulatory Visit (INDEPENDENT_AMBULATORY_CARE_PROVIDER_SITE_OTHER): Payer: Commercial Managed Care - PPO | Admitting: Psychology

## 2022-06-13 DIAGNOSIS — F411 Generalized anxiety disorder: Secondary | ICD-10-CM | POA: Diagnosis not present
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
## 2022-06-15 ENCOUNTER — Ambulatory Visit: Payer: 59 | Admitting: Family Medicine

## 2022-06-20 DIAGNOSIS — L820 Inflamed seborrheic keratosis: Secondary | ICD-10-CM | POA: Diagnosis not present

## 2022-06-20 DIAGNOSIS — L818 Other specified disorders of pigmentation: Secondary | ICD-10-CM | POA: Diagnosis not present

## 2022-06-27 ENCOUNTER — Ambulatory Visit (INDEPENDENT_AMBULATORY_CARE_PROVIDER_SITE_OTHER): Payer: Commercial Managed Care - PPO | Admitting: Psychology

## 2022-06-27 DIAGNOSIS — F331 Major depressive disorder, recurrent, moderate: Secondary | ICD-10-CM | POA: Diagnosis not present
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
## 2022-07-11 ENCOUNTER — Ambulatory Visit: Payer: Commercial Managed Care - PPO | Admitting: Psychology

## 2022-07-11 DIAGNOSIS — M1711 Unilateral primary osteoarthritis, right knee: Secondary | ICD-10-CM | POA: Diagnosis not present

## 2022-07-14 ENCOUNTER — Ambulatory Visit (INDEPENDENT_AMBULATORY_CARE_PROVIDER_SITE_OTHER): Payer: Commercial Managed Care - PPO | Admitting: Psychology

## 2022-07-14 DIAGNOSIS — F331 Major depressive disorder, recurrent, moderate: Secondary | ICD-10-CM | POA: Diagnosis not present

## 2022-07-14 DIAGNOSIS — F411 Generalized anxiety disorder: Secondary | ICD-10-CM | POA: Diagnosis not present
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
## 2022-07-25 ENCOUNTER — Ambulatory Visit: Payer: Commercial Managed Care - PPO | Admitting: Psychology

## 2022-08-08 ENCOUNTER — Other Ambulatory Visit: Payer: Self-pay | Admitting: Family Medicine

## 2022-08-08 ENCOUNTER — Other Ambulatory Visit (HOSPITAL_COMMUNITY): Payer: Self-pay

## 2022-08-08 ENCOUNTER — Ambulatory Visit: Payer: Commercial Managed Care - PPO | Admitting: Psychology

## 2022-08-08 DIAGNOSIS — F419 Anxiety disorder, unspecified: Secondary | ICD-10-CM

## 2022-08-08 MED ORDER — ATORVASTATIN CALCIUM 40 MG PO TABS
40.0000 mg | ORAL_TABLET | Freq: Every day | ORAL | 2 refills | Status: AC
  Filled 2022-08-08: qty 90, 90d supply, fill #0
  Filled 2023-04-06: qty 90, 90d supply, fill #1

## 2022-08-16 ENCOUNTER — Other Ambulatory Visit (HOSPITAL_COMMUNITY): Payer: Self-pay

## 2022-08-16 MED ORDER — ALPRAZOLAM 0.25 MG PO TABS
0.1250 mg | ORAL_TABLET | Freq: Every day | ORAL | 0 refills | Status: DC | PRN
  Filled 2022-08-16: qty 20, 20d supply, fill #0

## 2022-08-22 ENCOUNTER — Ambulatory Visit (INDEPENDENT_AMBULATORY_CARE_PROVIDER_SITE_OTHER): Payer: Commercial Managed Care - PPO | Admitting: Psychology

## 2022-08-22 DIAGNOSIS — F411 Generalized anxiety disorder: Secondary | ICD-10-CM | POA: Diagnosis not present
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
## 2022-08-31 ENCOUNTER — Other Ambulatory Visit (HOSPITAL_COMMUNITY): Payer: Self-pay

## 2022-09-02 ENCOUNTER — Encounter: Payer: Self-pay | Admitting: Family Medicine

## 2022-09-02 ENCOUNTER — Other Ambulatory Visit (HOSPITAL_COMMUNITY): Payer: Self-pay

## 2022-09-02 ENCOUNTER — Ambulatory Visit: Payer: Commercial Managed Care - PPO | Admitting: Family Medicine

## 2022-09-02 VITALS — BP 112/72 | HR 55 | Temp 98.5°F | Wt 125.0 lb

## 2022-09-02 DIAGNOSIS — L02214 Cutaneous abscess of groin: Secondary | ICD-10-CM

## 2022-09-02 MED ORDER — SULFAMETHOXAZOLE-TRIMETHOPRIM 800-160 MG PO TABS
1.0000 | ORAL_TABLET | Freq: Two times a day (BID) | ORAL | 0 refills | Status: AC
  Filled 2022-09-02: qty 14, 7d supply, fill #0

## 2022-09-02 NOTE — Progress Notes (Signed)
Established Patient Office Visit   Subjective  Patient ID: Alice Ryan, female    DOB: [birth year]  Age: 62 y.o. MRN: [id number]  Chief Complaint  Patient presents with   ingrown hair    Vaginal ingrown hair, noticed it a while ago. Just sore and hurts, has been in about it before. In groin area.     Pt is a 62 yo female followed by Dr. Swaziland and seen for ongoing concern.  Patient endorses possible ingrown hair in left groin times "a while".  Patient states area has become larger, drained intermittently but will not fully resolve.  Certain areas are painful.  Pt tries not to shave to help reduce frequency of symptoms.    Past Medical History:  Diagnosis Date   Acid reflux    Acute lateral meniscus tear of right knee    Acute medial meniscus tear of right knee    Anxiety    Blood in stool    GERD (gastroesophageal reflux disease)    History of kidney stones    Hypercholesteremia    Hyperlipidemia    Hypertension    Hypertension    Past Surgical History:  Procedure Laterality Date   BREAST EXCISIONAL BIOPSY Left 2011   benign   BREAST LUMPECTOMY WITH RADIOACTIVE SEED LOCALIZATION Right 12/13/2018   Procedure: RIGHT BREAST LUMPECTOMY WITH RADIOACTIVE SEED LOCALIZATION;  Surgeon: Harriette Bouillon, MD;  Location: [facility];  Service: General;  Laterality: Right;   CHONDROPLASTY Right 06/25/2019   Procedure: CHONDROPLASTY;  Surgeon: Salvatore Marvel, MD;  Location: [facility];  Service: Orthopedics;  Laterality: Right;   CYST EXCISION PERINEAL N/A 06/13/2016   Procedure: CYST EXCISION PERIANAL, CHRONIC INFECTED;  Surgeon: Jimmye Norman, MD;  Location: [facility];  Service: General;  Laterality: N/A;   EVALUATION UNDER ANESTHESIA WITH HEMORRHOIDECTOMY N/A 06/13/2016   Procedure: EXAM UNDER ANESTHESIA,  HEMORRHOIDECTOMY;  Surgeon: Jimmye Norman, MD;  Location: [facility];  Service: General;  Laterality: N/A;   KNEE  ARTHROSCOPY WITH MEDIAL MENISECTOMY Right 06/25/2019   Procedure: KNEE ARTHROSCOPY WITH MEDIAL MENISECTOMY;  Surgeon: Salvatore Marvel, MD;  Location: [facility];  Service: Orthopedics;  Laterality: Right;   lumpectomy right breast  2011   benign   TRIGGER FINGER RELEASE Right 07/31/2017   Procedure: RELEASE TRIGGER FINGER/A-1 PULLEY RIGHT RING FINGER;  Surgeon: Betha Loa, MD;  Location: [facility];  Service: Orthopedics;  Laterality: Right;   Family History  Problem Relation Age of Onset   Cancer Mother        breast cancer at age 20   Stroke Mother    Hypertension Mother    Breast cancer Mother 26   Hypertension Father    Allergies  Allergen Reactions   Apple Juice Anaphylaxis   Latex Anaphylaxis    Latex condom   Flounder [Fish Allergy] Swelling   Other Swelling   Oxycodone-Acetaminophen Other (See Comments)    "knocked me out" Other reaction(s): Other (See Comments) "knocked me out"   Peanut Butter Flavor Itching      ROS Negative unless stated above    Objective:     BP 112/72 (BP Location: Left Arm, Patient Position: Sitting, Cuff Size: Normal)   Pulse (!) 55   Temp 98.5 F (36.9 C) (Oral)   Wt 125 lb (56.7 kg)   LMP 09/02/2013   SpO2 98%   BMI 23.62 kg/m    Physical Exam Constitutional:  Appearance: Normal appearance.  HENT:     Head: Normocephalic and atraumatic.     Nose: Nose normal.     Mouth/Throat:     Mouth: Mucous membranes are moist.  Eyes:     Extraocular Movements: Extraocular movements intact.     Conjunctiva/sclera: Conjunctivae normal.     Pupils: Pupils are equal, round, and reactive to light.  Cardiovascular:     Rate and Rhythm: Normal rate.  Pulmonary:     Effort: Pulmonary effort is normal.  Genitourinary:      Comments: Large area of induration and tenderness of mons pubis, left labia, and upper left thigh/groin.  Skin taut.  No increased warmth. Skin:    General: Skin is warm and dry.   Neurological:     Mental Status: She is alert and oriented to person, place, and time.     I & D  Date/Time: 09/02/2022 10:48 AM  Performed by: Deeann Saint, MD Authorized by: Deeann Saint, MD   Location:    Type:  Abscess   Size:  L groin and L labia majora   Location:  Pelvic Pre-procedure details:    Skin preparation:  Alcohol and povidone-iodine Sedation:    Sedation type:  None Anesthesia:    Anesthesia method:  Local infiltration   Local anesthetic:  Lidocaine 1% w/o epi Procedure type:    Complexity:  Complex Procedure details:    Needle aspiration: no     Incision types:  Stab incision   Incision depth:  Dermal   Scalpel blade:  10   Wound management:  Probed and deloculated   Drainage:  Bloody and purulent   Drainage amount:  Moderate   Wound treatment:  Wound left open   Packing materials:  None Post-procedure details:    Procedure completion:  Tolerated well, no immediate complications Comments:     Concern for amount of tracking and induration.   No results found for any visits on 09/02/22.    Assessment & Plan:  Abscess of groin, left -     Sulfamethoxazole-Trimethoprim; Take 1 tablet by mouth 2 (two) times daily for 7 days.  Dispense: 14 tablet; Refill: 0  Other orders -     I & D  Complex abscess of left groin possibly HS.  Consent obtained.  I&D performed.  Start ABX.  Warm compresses, sitz bath, NSAIDs or Tylenol as needed.  Given amount of induration concern for recurrence.  If symptoms return/do not fully resolve obtain imaging.  Return if symptoms worsen or fail to improve.   Deeann Saint, MD

## 2022-09-02 NOTE — Patient Instructions (Addendum)
You can try using Hibiclens to help decrease the frequency of abscess you get.

## 2022-09-05 ENCOUNTER — Ambulatory Visit (INDEPENDENT_AMBULATORY_CARE_PROVIDER_SITE_OTHER): Payer: Commercial Managed Care - PPO | Admitting: Psychology

## 2022-09-05 DIAGNOSIS — F411 Generalized anxiety disorder: Secondary | ICD-10-CM | POA: Diagnosis not present
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
## 2022-09-19 ENCOUNTER — Ambulatory Visit (INDEPENDENT_AMBULATORY_CARE_PROVIDER_SITE_OTHER): Payer: Commercial Managed Care - PPO | Admitting: Psychology

## 2022-09-19 DIAGNOSIS — F411 Generalized anxiety disorder: Secondary | ICD-10-CM
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
## 2022-10-03 ENCOUNTER — Ambulatory Visit (INDEPENDENT_AMBULATORY_CARE_PROVIDER_SITE_OTHER): Payer: Commercial Managed Care - PPO | Admitting: Psychology

## 2022-10-03 DIAGNOSIS — F331 Major depressive disorder, recurrent, moderate: Secondary | ICD-10-CM | POA: Diagnosis not present

## 2022-10-03 DIAGNOSIS — F330 Major depressive disorder, recurrent, mild: Secondary | ICD-10-CM

## 2022-10-03 DIAGNOSIS — F411 Generalized anxiety disorder: Secondary | ICD-10-CM | POA: Diagnosis not present
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
## 2022-10-17 ENCOUNTER — Ambulatory Visit: Payer: Commercial Managed Care - PPO | Admitting: Psychology

## 2022-10-17 NOTE — Progress Notes (Unsigned)
° ° ° ° ° ° ° ° ° ° ° ° ° ° °  Mary Ann Garcia, PhD °

## 2022-10-20 ENCOUNTER — Other Ambulatory Visit (HOSPITAL_COMMUNITY): Payer: Self-pay

## 2022-10-20 DIAGNOSIS — M1711 Unilateral primary osteoarthritis, right knee: Secondary | ICD-10-CM | POA: Diagnosis not present

## 2022-10-20 MED ORDER — DICLOFENAC SODIUM 75 MG PO TBEC
75.0000 mg | DELAYED_RELEASE_TABLET | Freq: Two times a day (BID) | ORAL | 1 refills | Status: DC
  Filled 2022-10-20: qty 60, 30d supply, fill #0
  Filled 2023-08-09: qty 60, 30d supply, fill #1

## 2022-10-21 ENCOUNTER — Other Ambulatory Visit (HOSPITAL_COMMUNITY): Payer: Self-pay

## 2022-10-31 ENCOUNTER — Ambulatory Visit: Payer: Commercial Managed Care - PPO | Admitting: Psychology

## 2022-10-31 DIAGNOSIS — F331 Major depressive disorder, recurrent, moderate: Secondary | ICD-10-CM

## 2022-10-31 DIAGNOSIS — F411 Generalized anxiety disorder: Secondary | ICD-10-CM | POA: Diagnosis not present
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
## 2022-11-10 ENCOUNTER — Other Ambulatory Visit (HOSPITAL_COMMUNITY): Payer: Self-pay

## 2022-11-10 ENCOUNTER — Other Ambulatory Visit: Payer: Self-pay | Admitting: Student

## 2022-11-10 DIAGNOSIS — M1711 Unilateral primary osteoarthritis, right knee: Secondary | ICD-10-CM

## 2022-11-11 ENCOUNTER — Other Ambulatory Visit (HOSPITAL_BASED_OUTPATIENT_CLINIC_OR_DEPARTMENT_OTHER): Payer: Self-pay

## 2022-11-11 ENCOUNTER — Other Ambulatory Visit (HOSPITAL_COMMUNITY): Payer: Self-pay

## 2022-11-11 ENCOUNTER — Encounter (HOSPITAL_COMMUNITY): Payer: Self-pay

## 2022-11-14 ENCOUNTER — Ambulatory Visit: Payer: Commercial Managed Care - PPO | Admitting: Psychology

## 2022-11-14 ENCOUNTER — Other Ambulatory Visit (HOSPITAL_COMMUNITY): Payer: Self-pay

## 2022-11-14 ENCOUNTER — Other Ambulatory Visit (HOSPITAL_BASED_OUTPATIENT_CLINIC_OR_DEPARTMENT_OTHER): Payer: Self-pay

## 2022-11-14 DIAGNOSIS — F411 Generalized anxiety disorder: Secondary | ICD-10-CM | POA: Diagnosis not present

## 2022-11-14 DIAGNOSIS — F330 Major depressive disorder, recurrent, mild: Secondary | ICD-10-CM

## 2022-11-14 MED ORDER — DICLOFENAC SODIUM 1 % EX GEL
4.0000 g | Freq: Four times a day (QID) | CUTANEOUS | 0 refills | Status: DC
  Filled 2022-11-14: qty 100, 7d supply, fill #0
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
## 2022-11-17 ENCOUNTER — Encounter (INDEPENDENT_AMBULATORY_CARE_PROVIDER_SITE_OTHER): Payer: Self-pay

## 2022-11-28 ENCOUNTER — Ambulatory Visit: Payer: Commercial Managed Care - PPO | Admitting: Psychology

## 2022-11-28 DIAGNOSIS — F411 Generalized anxiety disorder: Secondary | ICD-10-CM

## 2022-11-28 DIAGNOSIS — F330 Major depressive disorder, recurrent, mild: Secondary | ICD-10-CM | POA: Diagnosis not present
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
## 2022-11-30 ENCOUNTER — Encounter: Payer: Self-pay | Admitting: Family Medicine

## 2022-11-30 ENCOUNTER — Other Ambulatory Visit (HOSPITAL_COMMUNITY): Payer: Self-pay

## 2022-11-30 ENCOUNTER — Ambulatory Visit (INDEPENDENT_AMBULATORY_CARE_PROVIDER_SITE_OTHER): Payer: Commercial Managed Care - PPO | Admitting: Family Medicine

## 2022-11-30 VITALS — BP 148/90 | HR 64 | Temp 98.6°F | Wt 125.6 lb

## 2022-11-30 DIAGNOSIS — L732 Hidradenitis suppurativa: Secondary | ICD-10-CM | POA: Diagnosis not present

## 2022-11-30 MED ORDER — DOXYCYCLINE HYCLATE 100 MG PO TABS
100.0000 mg | ORAL_TABLET | Freq: Two times a day (BID) | ORAL | 0 refills | Status: AC
  Filled 2022-11-30: qty 20, 10d supply, fill #0

## 2022-11-30 NOTE — Patient Instructions (Signed)
A prescription for doxycycline was sent to your pharmacy.  A referral to general surgery was placed.  They will call you about setting up an appointment.  An order for imaging was placed.  We will try to do an ultrasound but if need be it might have to be changed to a CT.

## 2022-11-30 NOTE — Progress Notes (Signed)
Established Patient Office Visit   Subjective  Patient ID: Alice Ryan, female    DOB: 04/14/1960  Age: 61 y.o. MRN: [id number]  Chief Complaint  Patient presents with   Medical Management of Chronic Issues    Ingrown hair. Is about the same from the last visit 5/31/4. States it is still draining, and has a heartbeat .     Patient is a 62 year old female followed by Dr. Swaziland and seen for ongoing concern.  Patient seen 09/02/2022 for abscess requiring I&D.  Patient states symptoms have continued since the visit.  Patient notes a few areas of drainage, tenderness, edema, discomfort especially when sitting.  Patient denies fever, chills, nausea, vomiting.    Past Medical History:  Diagnosis Date   Acid reflux    Acute lateral meniscus tear of right knee    Acute medial meniscus tear of right knee    Anxiety    Blood in stool    GERD (gastroesophageal reflux disease)    History of kidney stones    Hypercholesteremia    Hyperlipidemia    Hypertension    Hypertension    Past Surgical History:  Procedure Laterality Date   BREAST EXCISIONAL BIOPSY Left 2011   benign   BREAST LUMPECTOMY WITH RADIOACTIVE SEED LOCALIZATION Right 12/13/2018   Procedure: RIGHT BREAST LUMPECTOMY WITH RADIOACTIVE SEED LOCALIZATION;  Surgeon: Harriette Bouillon, MD;  Location: [facility];  Service: General;  Laterality: Right;   CHONDROPLASTY Right 06/25/2019   Procedure: CHONDROPLASTY;  Surgeon: Salvatore Marvel, MD;  Location: [facility];  Service: Orthopedics;  Laterality: Right;   CYST EXCISION PERINEAL N/A 06/13/2016   Procedure: CYST EXCISION PERIANAL, CHRONIC INFECTED;  Surgeon: Jimmye Norman, MD;  Location: [facility];  Service: General;  Laterality: N/A;   EVALUATION UNDER ANESTHESIA WITH HEMORRHOIDECTOMY N/A 06/13/2016   Procedure: EXAM UNDER ANESTHESIA,  HEMORRHOIDECTOMY;  Surgeon: Jimmye Norman, MD;  Location: [facility];  Service:  General;  Laterality: N/A;   KNEE ARTHROSCOPY WITH MEDIAL MENISECTOMY Right 06/25/2019   Procedure: KNEE ARTHROSCOPY WITH MEDIAL MENISECTOMY;  Surgeon: Salvatore Marvel, MD;  Location: [facility];  Service: Orthopedics;  Laterality: Right;   lumpectomy right breast  2011   benign   TRIGGER FINGER RELEASE Right 07/31/2017   Procedure: RELEASE TRIGGER FINGER/A-1 PULLEY RIGHT RING FINGER;  Surgeon: Betha Loa, MD;  Location:  SURGERY CENTER;  Service: Orthopedics;  Laterality: Right;   Social History   Tobacco Use   Smoking status: Never   Smokeless tobacco: Never  Substance Use Topics   Alcohol use: Yes    Comment: social   Drug use: No   Family History  Problem Relation Age of Onset   Cancer Mother        breast cancer at age 34   Stroke Mother    Hypertension Mother    Breast cancer Mother 14   Hypertension Father    Allergies  Allergen Reactions   Apple Juice Anaphylaxis   Latex Anaphylaxis    Latex condom   Flounder [Fish Allergy] Swelling   Other Swelling   Oxycodone-Acetaminophen Other (See Comments)    "knocked me out" Other reaction(s): Other (See Comments) "knocked me out"   Peanut Butter Flavor Itching      ROS Negative unless stated above    Objective:     BP (!) 148/90 (BP Location: Left Arm, Patient Position: Sitting, Cuff Size: Normal)   Pulse 64   Temp 98.6  F (37 C) (Oral)   Wt 125 lb 9.6 oz (57 kg)   LMP 09/02/2013   SpO2 100%   BMI 23.73 kg/m    Physical Exam Constitutional:      General: She is not in acute distress.    Appearance: Normal appearance.  HENT:     Head: Normocephalic and atraumatic.     Nose: Nose normal.     Mouth/Throat:     Mouth: Mucous membranes are moist.  Cardiovascular:     Rate and Rhythm: Normal rate and regular rhythm.     Heart sounds: Normal heart sounds. No murmur heard.    No gallop.  Pulmonary:     Effort: Pulmonary effort is normal. No respiratory distress.     Breath  sounds: Normal breath sounds. No wheezing, rhonchi or rales.  Skin:    General: Skin is warm and dry.     Findings: Abscess present.          Comments: Left lateral mons pubis,L inguinal area, and L medial upper thigh with induration, tenderness, scant purulent drainage, increased warmth, skin changes.  Neurological:     Mental Status: She is alert and oriented to person, place, and time.      No results found for any visits on 11/30/22.    Assessment & Plan:  Hidradenitis suppurativa -     Doxycycline Hyclate; Take 1 tablet (100 mg total) by mouth 2 (two) times daily for 10 days.  Dispense: 20 tablet; Refill: 0 -     Ambulatory referral to General Surgery -     US Abdomen Limited; Future  Discussed concerns for complex tracking given chronic nature.  Will obtain ultrasound.  Placed referral to general surgery for I&D.  Will start ABX while awaiting above.  Given strict precautions.  Return if symptoms worsen or fail to improve.   Deeann Saint, MD

## 2022-12-06 ENCOUNTER — Ambulatory Visit
Admission: RE | Admit: 2022-12-06 | Discharge: 2022-12-06 | Disposition: A | Discharge status: Home / Self Care | Payer: Commercial Managed Care - PPO | Source: Ambulatory Visit | Attending: Family Medicine | Admitting: Family Medicine

## 2022-12-06 DIAGNOSIS — L732 Hidradenitis suppurativa: Secondary | ICD-10-CM

## 2022-12-06 DIAGNOSIS — L02211 Cutaneous abscess of abdominal wall: Secondary | ICD-10-CM | POA: Diagnosis not present

## 2022-12-09 ENCOUNTER — Encounter: Payer: Self-pay | Admitting: Family Medicine

## 2022-12-12 ENCOUNTER — Ambulatory Visit: Payer: Commercial Managed Care - PPO | Admitting: Psychology

## 2022-12-12 NOTE — Telephone Encounter (Signed)
Called patient left a VM to return my call

## 2022-12-20 ENCOUNTER — Encounter: Payer: Commercial Managed Care - PPO | Admitting: Family Medicine

## 2022-12-20 NOTE — Progress Notes (Incomplete)
HPI: Alice Ryan is a 62 y.o. female  with PMHx significant for HTN, HLD, allergic rhinitis, and anxiety who is here today for her routine physical.  Last CPE: 12/15/2021 Since her last visit she has been seen for recurrent suppurativa hydradenitis, still on oral abx and planning on surgical consultation this Friday.  Exercise:She does aerobics at home every day. Diet: She is eating out frequently, and does not eating vegetables daily. Sleep: She is sleeping ~10hrs/night. She works 6pm-6am so she often goes to bed around 6:45 am to 12pm between shifts but gets a full night when she is off the next day. Alcohol: 2x/week Vision: UTD on routine vision care Dental: UTD on routine dental care   Immunization History  Administered Date(s) Administered   Influenza Split 01/09/2012   Influenza, Seasonal, Injecte, Preservative Fre 12/21/2022   Influenza,inj,Quad PF,6+ Mos 12/28/2012, 01/14/2016, 01/08/2019, 12/25/2019, 12/15/2021   Pneumococcal Polysaccharide-23 04/05/2011   Tdap 04/05/2011   Health Maintenance  Topic Date Due   Zoster Vaccines- Shingrix (1 of 2) Never done   Cervical Cancer Screening (HPV/Pap Cotest)  08/07/2020   DTaP/Tdap/Td (2 - Td or Tdap) 04/04/2021   COVID-19 Vaccine (1 - 2023-24 season) Never done   HIV Screening  07/09/2024 (Originally 07/06/1975)   Hepatitis C Screening  05/31/2025 (Originally 07/06/1978)   MAMMOGRAM  02/17/2023   Fecal DNA (Cologuard)  09/23/2023   INFLUENZA VACCINE  Completed   HPV VACCINES  Aged Out   Hypertension: She is on amlodipine-olmesartan 5-20mg  BP readings at home: Patient states she is not checking bp at home Lab Results  Component Value Date   CREATININE 0.78 12/15/2021   BUN 17 12/15/2021   NA 141 12/15/2021   K 3.9 12/15/2021   CL 106 12/15/2021   CO2 27 12/15/2021   Hyperlipidemia: Currently on atorvastatin 40mg   Lab Results  Component Value Date   CHOL 261 (H) 12/15/2021   HDL 97.30 12/15/2021    LDLCALC 152 (H) 12/15/2021   LDLDIRECT 165.0 02/26/2013   TRIG 59.0 12/15/2021   CHOLHDL 3 12/15/2021   Anxiety/Depression: She is taking Lexapro 10 mg, which is helping some.  States that her therapist has recommended to increase the dosage of Alprazolam..  She is also taking alprazolam .25 mg prn She states that she is seeing her therapist about once a month.   Knee OA: States she will be having a right knee replacement in the future. She had been given medication for pain but saw no improvement. She follows with orthopedist.  Review of Systems  Constitutional:  Negative for activity change, appetite change and fever.  HENT:  Negative for hearing loss, mouth sores, sore throat and trouble swallowing.   Eyes:  Negative for redness and visual disturbance.  Respiratory:  Negative for cough, shortness of breath and wheezing.   Cardiovascular:  Negative for chest pain and leg swelling.  Gastrointestinal:  Negative for abdominal pain, nausea and vomiting.       No changes in bowel habits.  Endocrine: Negative for cold intolerance, heat intolerance, polydipsia, polyphagia and polyuria.  Genitourinary:  Negative for decreased urine volume, dysuria, hematuria, vaginal bleeding and vaginal discharge.  Musculoskeletal:  Positive for arthralgias. Negative for gait problem.  Skin:  Negative for color change and rash.  Allergic/Immunologic: Positive for environmental allergies.  Neurological:  Negative for syncope, weakness and headaches.  Hematological:  Negative for adenopathy. Does not bruise/bleed easily.  Psychiatric/Behavioral:  Negative for confusion and hallucinations. The patient is nervous/anxious.  All other systems reviewed and are negative.  Current Outpatient Medications on File Prior to Visit  Medication Sig Dispense Refill   amLODipine-olmesartan (AZOR) 5-20 MG tablet Take 1 tablet by mouth daily. 90 tablet 2   atorvastatin (LIPITOR) 40 MG tablet Take 1 tablet (40 mg total) by  mouth daily. 90 tablet 2   diclofenac (VOLTAREN) 75 MG EC tablet Take 1 tablet (75 mg total) by mouth 2 (two) times daily. 60 tablet 1   diclofenac Sodium (VOLTAREN) 1 % GEL Apply 4 g topically 4 (four) times daily. 100 g 0   EPINEPHrine (EPIPEN 2-PAK) 0.3 mg/0.3 mL IJ SOAJ injection Inject 0.3 mg into the muscle as needed for anaphylaxis. 2 each 2   HYDROcodone-acetaminophen (NORCO/VICODIN) 5-325 MG tablet Take 1-2 tablets by mouth every 4 (four) hours for dental pain. Max daily dose: 8 tabs 12 tablet 0   mometasone (NASONEX) 50 MCG/ACT nasal spray Place 2 sprays into the nose daily. 17 g 6   triamcinolone cream (KENALOG) 0.1 % Apply 1 application topically daily as needed. 45 g 1   chlorhexidine (PERIDEX) 0.12 % solution Swish 15 mLs in mouth for 30 seconds 2 (two) times daily after brushing teeth, then spit out (Patient not taking: Reported on 11/30/2022) 473 mL 2   doxycycline (PERIOSTAT) 20 MG tablet Take 1 tablet (20 mg total) by mouth 2 (two) times daily for periodontal infection until medicine is gone. (Patient not taking: Reported on 11/30/2022) 60 tablet 5   valACYclovir (VALTREX) 1000 MG tablet TAKE 2 TABLETS BY MOUTH TWICE DAILY FOR 1 DAY. TAKE AT FIST SIGNS OF OUTBREAK. (Patient not taking: Reported on 11/30/2022) 30 tablet 1   No current facility-administered medications on file prior to visit.    Past Medical History:  Diagnosis Date   Acid reflux    Acute lateral meniscus tear of right knee    Acute medial meniscus tear of right knee    Anxiety    Blood in stool    GERD (gastroesophageal reflux disease)    History of kidney stones    Hypercholesteremia    Hyperlipidemia    Hypertension    Hypertension     Past Surgical History:  Procedure Laterality Date   BREAST EXCISIONAL BIOPSY Left 2011   benign   BREAST LUMPECTOMY WITH RADIOACTIVE SEED LOCALIZATION Right 12/13/2018   Procedure: RIGHT BREAST LUMPECTOMY WITH RADIOACTIVE SEED LOCALIZATION;  Surgeon: Harriette Bouillon,  MD;  Location: [facility];  Service: General;  Laterality: Right;   CHONDROPLASTY Right 06/25/2019   Procedure: CHONDROPLASTY;  Surgeon: Salvatore Marvel, MD;  Location: [facility];  Service: Orthopedics;  Laterality: Right;   CYST EXCISION PERINEAL N/A 06/13/2016   Procedure: CYST EXCISION PERIANAL, CHRONIC INFECTED;  Surgeon: Jimmye Norman, MD;  Location: [facility];  Service: General;  Laterality: N/A;   EVALUATION UNDER ANESTHESIA WITH HEMORRHOIDECTOMY N/A 06/13/2016   Procedure: EXAM UNDER ANESTHESIA,  HEMORRHOIDECTOMY;  Surgeon: Jimmye Norman, MD;  Location: [facility];  Service: General;  Laterality: N/A;   KNEE ARTHROSCOPY WITH MEDIAL MENISECTOMY Right 06/25/2019   Procedure: KNEE ARTHROSCOPY WITH MEDIAL MENISECTOMY;  Surgeon: Salvatore Marvel, MD;  Location: [facility];  Service: Orthopedics;  Laterality: Right;   lumpectomy right breast  2011   benign   TRIGGER FINGER RELEASE Right 07/31/2017   Procedure: RELEASE TRIGGER FINGER/A-1 PULLEY RIGHT RING FINGER;  Surgeon: Betha Loa, MD;  Location: [facility];  Service: Orthopedics;  Laterality: Right;  Allergies  Allergen Reactions   Apple Juice Anaphylaxis   Latex Anaphylaxis    Latex condom   Flounder [Fish Allergy] Swelling   Other Swelling   Oxycodone-Acetaminophen Other (See Comments)    "knocked me out" Other reaction(s): Other (See Comments) "knocked me out"   Peanut Butter Flavor Itching    Family History  Problem Relation Age of Onset   Cancer Mother        breast cancer at age 49   Stroke Mother    Hypertension Mother    Breast cancer Mother 52   Hypertension Father     Social History   Socioeconomic History   Marital status: Married    Spouse name: Not on file   Number of children: Not on file   Years of education: Not on file   Highest education level: Not on file  Occupational History   Not on file  Tobacco Use    Smoking status: Never   Smokeless tobacco: Never  Substance and Sexual Activity   Alcohol use: Yes    Comment: social   Drug use: No   Sexual activity: Not on file  Other Topics Concern   Not on file  Social History Narrative   Not on file   Social Determinants of Health   Financial Resource Strain: Not on file  Food Insecurity: Not on file  Transportation Needs: Not on file  Physical Activity: Not on file  Stress: Not on file  Social Connections: Not on file   Vitals:   12/21/22 0921  BP: 126/80  Pulse: 75  Resp: 16  Temp: 98.4 F (36.9 C)  SpO2: 99%   Body mass index is 23.45 kg/m.  Wt Readings from Last 3 Encounters:  12/21/22 124 lb 2 oz (56.3 kg)  11/30/22 125 lb 9.6 oz (57 kg)  09/02/22 125 lb (56.7 kg)   Physical Exam Vitals and nursing note reviewed. Exam conducted with a chaperone present.  Constitutional:      General: She is not in acute distress.    Appearance: She is well-developed.  HENT:     Head: Normocephalic and atraumatic.     Right Ear: Hearing, tympanic membrane, ear canal and external ear normal.     Left Ear: Hearing, tympanic membrane, ear canal and external ear normal.     Mouth/Throat:     Mouth: Mucous membranes are moist.     Pharynx: Oropharynx is clear. Uvula midline.  Eyes:     Extraocular Movements: Extraocular movements intact.     Conjunctiva/sclera: Conjunctivae normal.     Pupils: Pupils are equal, round, and reactive to light.  Neck:     Thyroid: No thyroid mass or thyromegaly.  Cardiovascular:     Rate and Rhythm: Normal rate and regular rhythm.     Pulses:          Dorsalis pedis pulses are 2+ on the right side and 2+ on the left side.     Heart sounds: No murmur heard. Pulmonary:     Effort: Pulmonary effort is normal. No respiratory distress.     Breath sounds: Normal breath sounds.  Abdominal:     Palpations: Abdomen is soft. There is no hepatomegaly or mass.     Tenderness: There is no abdominal tenderness.   Genitourinary:    Exam position: Lithotomy position.     Labia:        Right: No rash or tenderness.        Left: No rash  or tenderness.      Vagina: No signs of injury and foreign body. No vaginal discharge, erythema, tenderness, bleeding or lesions.     Cervix: No cervical motion tenderness, discharge, friability or erythema.     Uterus: Not enlarged and not tender.      Adnexa:        Right: No mass, tenderness or fullness.         Left: No mass, tenderness or fullness.       Comments: Pap smear collected. Musculoskeletal:     Right lower leg: No edema.     Left lower leg: No edema.     Comments: No major deformity or signs of synovitis appreciated.  Lymphadenopathy:     Cervical: No cervical adenopathy.     Upper Body:     Right upper body: No supraclavicular adenopathy.     Left upper body: No supraclavicular adenopathy.  Skin:    General: Skin is warm.     Findings: No erythema or rash.     Comments: HSV lesion above lips, left side, healing.  Neurological:     General: No focal deficit present.     Mental Status: She is alert and oriented to person, place, and time.     Cranial Nerves: No cranial nerve deficit.     Coordination: Coordination normal.     Gait: Gait normal.     Deep Tendon Reflexes:     Reflex Scores:      Bicep reflexes are 2+ on the right side and 2+ on the left side.      Patellar reflexes are 2+ on the right side and 2+ on the left side. Psychiatric:        Mood and Affect: Affect normal. Mood is anxious.   ASSESSMENT AND PLAN: Alice Ryan was here today annual physical examination.  Orders Placed This Encounter  Procedures   Flu vaccine trivalent PF, 6mos and older(Flulaval,Afluria,Fluarix,Fluzone)   Comprehensive metabolic panel   Hemoglobin A1c   Lipid panel   Lab Results  Component Value Date   NA 142 12/21/2022   CL 107 12/21/2022   K 3.3 (L) 12/21/2022   CO2 27 12/21/2022   BUN 14 12/21/2022   CREATININE 0.75  12/21/2022   GFR 86.51 12/21/2022   CALCIUM 9.2 12/21/2022   ALBUMIN 4.0 12/21/2022   GLUCOSE 88 12/21/2022   Lab Results  Component Value Date   ALT 12 12/21/2022   AST 17 12/21/2022   ALKPHOS 90 12/21/2022   BILITOT 0.3 12/21/2022   Lab Results  Component Value Date   CHOL 210 (H) 12/21/2022   HDL 76.50 12/21/2022   LDLCALC 117 (H) 12/21/2022   LDLDIRECT 165.0 02/26/2013   TRIG 84.0 12/21/2022   CHOLHDL 3 12/21/2022   Lab Results  Component Value Date   HGBA1C 5.7 12/21/2022   Routine general medical examination at a health care facility Assessment & Plan: We discussed the importance of regular physical activity and healthy diet for prevention of chronic illness and/or complications. Preventive guidelines reviewed. Vaccination: Prefers to hold on shingrix vaccine. Tdap 7 years ago and flu vaccine given today. Ca++ and vit D supplementation recommended. Pap smear done today. DEXA ordered. Next CPE in a year.   Hyperlipidemia, unspecified hyperlipidemia type Assessment & Plan: Continue Atorvastatin 40 mg daily and low fat diet. Further recommendations according to lipid panel results.  Orders: -     Lipid panel; Future  Need for influenza vaccination -  Flu vaccine trivalent PF, 6mos and older(Flulaval,Afluria,Fluarix,Fluzone)  Essential hypertension Assessment & Plan: BP adequately controlled. Continue amlodipine-olmesartan 5-20 mg daily as well as low-salt diet. Eye exam is current.  Orders: -     Comprehensive metabolic panel; Future  Screening for endocrine, metabolic and immunity disorder -     Hemoglobin A1c; Future  Cervical cancer screening -     Cytology - PAP  GAD (generalized anxiety disorder) Assessment & Plan: Problem is not well controlled. Lexapro dose increased from 10 mg to 20 mg daily and Alprazolam from 0.25 mg daily as needed to 0.5 mg daily prn. Continue CBT. F/U in 3 months.  Orders: -     Escitalopram Oxalate; Take 1  tablet (20 mg total) by mouth daily.  Dispense: 90 tablet; Refill: 1 -     ALPRAZolam; Take 1/2-1 tablet (0.25-0.5 mg total) by mouth 2 (two) times daily as needed for anxiety.  Dispense: 30 tablet; Refill: 1  Asymptomatic postmenopausal estrogen deficiency -     DG Bone Density; Future  Return in 3 months (on 03/22/2023) for chronic problems.  I,Rachel Rivera,acting as a scribe for Betty Swaziland, MD.,have documented all relevant documentation on the behalf of Betty [place], MD,as directed by  Betty [place], MD while in the presence of Betty [place], MD.  I, Isabelle Course, have reviewed all documentation for this visit. The documentation on 12/21/22 for the exam, diagnosis, procedures, and orders are all accurate and complete.  [name]. Sw[place], MD  [facility]. [place] office.

## 2022-12-21 ENCOUNTER — Encounter: Payer: Self-pay | Admitting: Family Medicine

## 2022-12-21 ENCOUNTER — Other Ambulatory Visit (HOSPITAL_COMMUNITY)
Admission: RE | Admit: 2022-12-21 | Discharge: 2022-12-21 | Disposition: A | Discharge status: Home / Self Care | Payer: Commercial Managed Care - PPO | Source: Ambulatory Visit | Attending: Family Medicine | Admitting: Family Medicine

## 2022-12-21 ENCOUNTER — Ambulatory Visit (INDEPENDENT_AMBULATORY_CARE_PROVIDER_SITE_OTHER): Payer: Commercial Managed Care - PPO | Admitting: Family Medicine

## 2022-12-21 ENCOUNTER — Other Ambulatory Visit (HOSPITAL_COMMUNITY): Payer: Self-pay

## 2022-12-21 ENCOUNTER — Other Ambulatory Visit: Payer: Self-pay

## 2022-12-21 VITALS — BP 126/80 | HR 75 | Temp 98.4°F | Resp 16 | Ht 61.0 in | Wt 124.1 lb

## 2022-12-21 DIAGNOSIS — Z23 Encounter for immunization: Secondary | ICD-10-CM | POA: Diagnosis not present

## 2022-12-21 DIAGNOSIS — Z124 Encounter for screening for malignant neoplasm of cervix: Secondary | ICD-10-CM

## 2022-12-21 DIAGNOSIS — I10 Essential (primary) hypertension: Secondary | ICD-10-CM | POA: Diagnosis not present

## 2022-12-21 DIAGNOSIS — Z130 Encounter for screening for diseases of the blood and blood-forming organs and certain disorders involving the immune mechanism: Secondary | ICD-10-CM | POA: Diagnosis not present

## 2022-12-21 DIAGNOSIS — F411 Generalized anxiety disorder: Secondary | ICD-10-CM | POA: Diagnosis not present

## 2022-12-21 DIAGNOSIS — Z13228 Encounter for screening for other metabolic disorders: Secondary | ICD-10-CM | POA: Diagnosis not present

## 2022-12-21 DIAGNOSIS — E876 Hypokalemia: Secondary | ICD-10-CM

## 2022-12-21 DIAGNOSIS — E785 Hyperlipidemia, unspecified: Secondary | ICD-10-CM | POA: Diagnosis not present

## 2022-12-21 DIAGNOSIS — Z780 Asymptomatic menopausal state: Secondary | ICD-10-CM

## 2022-12-21 DIAGNOSIS — Z0000 Encounter for general adult medical examination without abnormal findings: Secondary | ICD-10-CM | POA: Diagnosis not present

## 2022-12-21 DIAGNOSIS — Z1329 Encounter for screening for other suspected endocrine disorder: Secondary | ICD-10-CM | POA: Diagnosis not present

## 2022-12-21 LAB — COMPREHENSIVE METABOLIC PANEL WITH GFR
ALT: 12 U/L (ref 0–35)
AST: 17 U/L (ref 0–37)
Albumin: 4 g/dL (ref 3.5–5.2)
Alkaline Phosphatase: 90 U/L (ref 39–117)
BUN: 14 mg/dL (ref 6–23)
CO2: 27 meq/L (ref 19–32)
Calcium: 9.2 mg/dL (ref 8.4–10.5)
Chloride: 107 meq/L (ref 96–112)
Creatinine, Ser: 0.75 mg/dL (ref 0.40–1.20)
GFR: 86.51 mL/min (ref >60.00)
Glucose, Bld: 88 mg/dL (ref 70–99)
Potassium: 3.3 meq/L — ABNORMAL LOW (ref 3.5–5.1)
Sodium: 142 meq/L (ref 135–145)
Total Bilirubin: 0.3 mg/dL (ref 0.2–1.2)
Total Protein: 7.2 g/dL (ref 6.0–8.3)

## 2022-12-21 LAB — LIPID PANEL
Cholesterol: 210 mg/dL — ABNORMAL HIGH (ref 0–200)
HDL: 76.5 mg/dL (ref >39.00)
LDL Cholesterol: 117 mg/dL — ABNORMAL HIGH (ref 0–99)
NonHDL: 133.98
Total CHOL/HDL Ratio: 3
Triglycerides: 84 mg/dL (ref 0.0–149.0)
VLDL: 16.8 mg/dL (ref 0.0–40.0)

## 2022-12-21 LAB — HEMOGLOBIN A1C: Hgb A1c MFr Bld: 5.7 % (ref 4.6–6.5)

## 2022-12-21 MED ORDER — ESCITALOPRAM OXALATE 20 MG PO TABS
20.0000 mg | ORAL_TABLET | Freq: Every day | ORAL | 1 refills | Status: DC
  Filled 2022-12-21 – 2023-04-06 (×2): qty 90, 90d supply, fill #0
  Filled 2023-10-23: qty 90, 90d supply, fill #1

## 2022-12-21 MED ORDER — ALPRAZOLAM 0.5 MG PO TABS
0.2500 mg | ORAL_TABLET | Freq: Two times a day (BID) | ORAL | 1 refills | Status: DC | PRN
  Filled 2022-12-21: qty 30, 15d supply, fill #0
  Filled 2023-04-06: qty 30, 15d supply, fill #1

## 2022-12-21 NOTE — Patient Instructions (Addendum)
A few things to remember from today's visit:  Routine general medical examination at a health care facility  Hyperlipidemia, unspecified hyperlipidemia type - Plan: Lipid panel  Need for influenza vaccination - Plan: Flu vaccine trivalent PF, 6mos and older(Flulaval,Afluria,Fluarix,Fluzone)  Essential hypertension - Plan: Comprehensive metabolic panel  Screening for endocrine, metabolic and immunity disorder - Plan: Hemoglobin A1c  Cervical cancer screening - Plan: PAP [[facility]]  GAD (generalized anxiety disorder) - Plan: escitalopram (LEXAPRO) 20 MG tablet, ALPRAZolam (XANAX) 0.5 MG tablet  Asymptomatic postmenopausal estrogen deficiency - Plan: DG Bone Density  Lexapro and Xanax dose increased.  If you need refills for medications you take chronically, please call your pharmacy. Do not use My Chart to request refills or for acute issues that need immediate attention. If you send a my chart message, it may take a few days to be addressed, specially if I am not in the office.  Please be sure medication list is accurate. If a new problem present, please set up appointment sooner than planned today.  Health Maintenance, Female Adopting a healthy lifestyle and getting preventive care are important in promoting health and wellness. Ask your health care provider about: The right schedule for you to have regular tests and exams. Things you can do on your own to prevent diseases and keep yourself healthy. What should I know about diet, weight, and exercise? Eat a healthy diet  Eat a diet that includes plenty of vegetables, fruits, low-fat dairy products, and lean protein. Do not eat a lot of foods that are high in solid fats, added sugars, or sodium. Maintain a healthy weight Body mass index (BMI) is used to identify weight problems. It estimates body fat based on height and weight. Your health care provider can help determine your BMI and help you achieve or maintain a healthy  weight. Get regular exercise Get regular exercise. This is one of the most important things you can do for your health. Most adults should: Exercise for at least 150 minutes each week. The exercise should increase your heart rate and make you sweat (moderate-intensity exercise). Do strengthening exercises at least twice a week. This is in addition to the moderate-intensity exercise. Spend less time sitting. Even light physical activity can be beneficial. Watch cholesterol and blood lipids Have your blood tested for lipids and cholesterol at 60 years of age, then have this test every 5 years. Have your cholesterol levels checked more often if: Your lipid or cholesterol levels are high. You are older than 60 years of age. You are at high risk for heart disease. What should I know about cancer screening? Depending on your health history and family history, you may need to have cancer screening at various ages. This may include screening for: Breast cancer. Cervical cancer. Colorectal cancer. Skin cancer. Lung cancer. What should I know about heart disease, diabetes, and high blood pressure? Blood pressure and heart disease High blood pressure causes heart disease and increases the risk of stroke. This is more likely to develop in people who have high blood pressure readings or are overweight. Have your blood pressure checked: Every 3-5 years if you are 64-62 years of age. Every year if you are 50 years old or older. Diabetes Have regular diabetes screenings. This checks your fasting blood sugar level. Have the screening done: Once every three years after age 52 if you are at a normal weight and have a low risk for diabetes. More often and at a younger age if you  are overweight or have a high risk for diabetes. What should I know about preventing infection? Hepatitis B If you have a higher risk for hepatitis B, you should be screened for this virus. Talk with your health care provider to  find out if you are at risk for hepatitis B infection. Hepatitis C Testing is recommended for: Everyone born from [birth year] through 1963. Anyone with known risk factors for hepatitis C. Sexually transmitted infections (STIs) Get screened for STIs, including gonorrhea and chlamydia, if: You are sexually active and are younger than 60 years of age. You are older than 60 years of age and your health care provider tells you that you are at risk for this type of infection. Your sexual activity has changed since you were last screened, and you are at increased risk for chlamydia or gonorrhea. Ask your health care provider if you are at risk. Ask your health care provider about whether you are at high risk for HIV. Your health care provider may recommend a prescription medicine to help prevent HIV infection. If you choose to take medicine to prevent HIV, you should first get tested for HIV. You should then be tested every 3 months for as long as you are taking the medicine. Pregnancy If you are about to stop having your period (premenopausal) and you may become pregnant, seek counseling before you get pregnant. Take 400 to 800 micrograms (mcg) of folic acid every day if you become pregnant. Ask for birth control (contraception) if you want to prevent pregnancy. Osteoporosis and menopause Osteoporosis is a disease in which the bones lose minerals and strength with aging. This can result in bone fractures. If you are 95 years old or older, or if you are at risk for osteoporosis and fractures, ask your health care provider if you should: Be screened for bone loss. Take a calcium or vitamin D supplement to lower your risk of fractures. Be given hormone replacement therapy (HRT) to treat symptoms of menopause. Follow these instructions at home: Alcohol use Do not drink alcohol if: Your health care provider tells you not to drink. You are pregnant, may be pregnant, or are planning to become pregnant. If you  drink alcohol: Limit how much you have to: 0-1 drink a day. Know how much alcohol is in your drink. In the [place] one drink equals one 12 oz bottle of beer (355 mL), one 5 oz glass of wine (148 mL), or one 1 oz glass of hard liquor (44 mL). Lifestyle Do not use any products that contain nicotine or tobacco. These products include cigarettes, chewing tobacco, and vaping devices, such as e-cigarettes. If you need help quitting, ask your health care provider. Do not use street drugs. Do not share needles. Ask your health care provider for help if you need support or information about quitting drugs. General instructions Schedule regular health, dental, and eye exams. Stay current with your vaccines. Tell your health care provider if: You often feel depressed. You have ever been abused or do not feel safe at home. Summary Adopting a healthy lifestyle and getting preventive care are important in promoting health and wellness. Follow your health care provider's instructions about healthy diet, exercising, and getting tested or screened for diseases. Follow your health care provider's instructions on monitoring your cholesterol and blood pressure. This information is not intended to replace advice given to you by your health care provider. Make sure you discuss any questions you have with your health care provider. Document Revised: 08/10/2020  Document Reviewed: 08/10/2020 Elsevier Patient Education  2024 [employer].

## 2022-12-22 ENCOUNTER — Other Ambulatory Visit (HOSPITAL_COMMUNITY): Payer: Self-pay

## 2022-12-22 ENCOUNTER — Telehealth: Payer: Self-pay | Admitting: Family Medicine

## 2022-12-22 MED ORDER — POTASSIUM CHLORIDE CRYS ER 20 MEQ PO TBCR
20.0000 meq | EXTENDED_RELEASE_TABLET | Freq: Every day | ORAL | 0 refills | Status: DC
  Filled 2022-12-22: qty 5, 5d supply, fill #0

## 2022-12-22 MED ORDER — AMLODIPINE-OLMESARTAN 5-20 MG PO TABS
1.0000 | ORAL_TABLET | Freq: Every day | ORAL | 3 refills | Status: DC
  Filled 2022-12-22 – 2023-04-06 (×2): qty 90, 90d supply, fill #0

## 2022-12-22 NOTE — Assessment & Plan Note (Signed)
We discussed the importance of regular physical activity and healthy diet for prevention of chronic illness and/or complications. Preventive guidelines reviewed. Vaccination: Prefers to hold on shingrix vaccine. Tdap 7 years ago and flu vaccine given today. Ca++ and vit D supplementation recommended. Pap smear done today. DEXA ordered. Next CPE in a year.

## 2022-12-22 NOTE — Assessment & Plan Note (Signed)
Continue Atorvastatin 40 mg daily and low fat diet. Further recommendations according to lipid panel results.

## 2022-12-22 NOTE — Assessment & Plan Note (Signed)
Problem is not well controlled. Lexapro dose increased from 10 mg to 20 mg daily and Alprazolam from 0.25 mg daily as needed to 0.5 mg daily prn. Continue CBT. F/U in 3 months.

## 2022-12-22 NOTE — Assessment & Plan Note (Signed)
BP adequately controlled. Continue amlodipine-olmesartan 5-20 mg daily as well as low-salt diet. Eye exam is current.

## 2022-12-22 NOTE — Telephone Encounter (Signed)
Pt called, returning CMA's call regarding labs. Pt informed MD & CMA were OOO today. Pt asked that any CMA call back, at their earliest convenience.

## 2022-12-23 ENCOUNTER — Other Ambulatory Visit: Payer: Self-pay | Admitting: Surgery

## 2022-12-23 ENCOUNTER — Other Ambulatory Visit (HOSPITAL_COMMUNITY): Payer: Self-pay

## 2022-12-23 ENCOUNTER — Other Ambulatory Visit: Payer: Self-pay

## 2022-12-23 DIAGNOSIS — L732 Hidradenitis suppurativa: Secondary | ICD-10-CM | POA: Diagnosis not present

## 2022-12-23 MED ORDER — DOXYCYCLINE MONOHYDRATE 100 MG PO CAPS
100.0000 mg | ORAL_CAPSULE | Freq: Two times a day (BID) | ORAL | 0 refills | Status: DC
  Filled 2022-12-23: qty 28, 14d supply, fill #0

## 2022-12-23 NOTE — Telephone Encounter (Signed)
See result note.  

## 2022-12-26 ENCOUNTER — Ambulatory Visit (INDEPENDENT_AMBULATORY_CARE_PROVIDER_SITE_OTHER): Payer: Commercial Managed Care - PPO | Admitting: Psychology

## 2022-12-26 DIAGNOSIS — F330 Major depressive disorder, recurrent, mild: Secondary | ICD-10-CM

## 2022-12-26 DIAGNOSIS — F411 Generalized anxiety disorder: Secondary | ICD-10-CM
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
## 2022-12-27 LAB — CYTOLOGY - PAP
Comment: NEGATIVE
Diagnosis: NEGATIVE
Diagnosis: REACTIVE
High risk HPV: POSITIVE — AB

## 2023-01-02 ENCOUNTER — Encounter (HOSPITAL_BASED_OUTPATIENT_CLINIC_OR_DEPARTMENT_OTHER): Payer: Self-pay | Admitting: Surgery

## 2023-01-03 ENCOUNTER — Other Ambulatory Visit (HOSPITAL_COMMUNITY): Payer: Self-pay

## 2023-01-05 ENCOUNTER — Encounter (HOSPITAL_BASED_OUTPATIENT_CLINIC_OR_DEPARTMENT_OTHER)
Admission: RE | Admit: 2023-01-05 | Discharge: 2023-01-05 | Disposition: A | Discharge status: Home / Self Care | Payer: Commercial Managed Care - PPO | Source: Ambulatory Visit | Attending: Surgery | Admitting: Surgery

## 2023-01-05 DIAGNOSIS — Z01812 Encounter for preprocedural laboratory examination: Secondary | ICD-10-CM | POA: Insufficient documentation

## 2023-01-05 MED ORDER — CHLORHEXIDINE GLUCONATE CLOTH 2 % EX PADS: 6.0000 | MEDICATED_PAD | Freq: Once | CUTANEOUS | Status: DC

## 2023-01-05 MED ORDER — ENSURE PRE-SURGERY PO LIQD: 296.0000 mL | Freq: Once | ORAL | Status: DC

## 2023-01-05 NOTE — Progress Notes (Signed)

## 2023-01-09 ENCOUNTER — Telehealth: Payer: Self-pay

## 2023-01-09 ENCOUNTER — Ambulatory Visit (INDEPENDENT_AMBULATORY_CARE_PROVIDER_SITE_OTHER): Payer: Commercial Managed Care - PPO | Admitting: Psychology

## 2023-01-09 DIAGNOSIS — F330 Major depressive disorder, recurrent, mild: Secondary | ICD-10-CM | POA: Diagnosis not present

## 2023-01-09 DIAGNOSIS — F411 Generalized anxiety disorder: Secondary | ICD-10-CM

## 2023-01-09 NOTE — Telephone Encounter (Signed)
Patient called in with questions in regards to her pap smear. We went over all her questions and she will call back when she has recovered from surgery if she would like to repeat the pap smear sooner than a year.
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
## 2023-01-09 NOTE — H&P (Signed)
REFERRING PHYSICIAN: Deeann Saint, MD PROVIDER: Wayne Both, MD MRN: [id number] DOB: [birth year]  Subjective   Chief Complaint: New Consultation (Ingrown hair infection)  History of Present Illness: Alice Ryan is a 62 y.o. female who is seen as an office consultation for evaluation of New Consultation (Ingrown hair infection)  This is a pleasant 62 year old female referred here for evaluation of possible hidradenitis in her left groin and inner thigh. She reports she has had issues in this area for at least 6 months. She has similar areas in her axilla in the past. She has been on multiple antibiotic regimens as had draining areas. Her pain is improving. She is otherwise healthy without complaints and has no cardiopulmonary issues  Review of Systems: A complete review of systems was obtained from the patient. I have reviewed this information and discussed as appropriate with the patient. See HPI as well for other ROS.  ROS   Medical History: Past Medical History:  Diagnosis Date  Anxiety  Arthritis  GERD (gastroesophageal reflux disease)  Hyperlipidemia  Hypertension   There is no problem list on file for this patient.  History reviewed. No pertinent surgical history.   Allergies  Allergen Reactions  Apple Anaphylaxis  Latex Anaphylaxis  Latex condom  Oxycodone-Acetaminophen Other (See Comments)  "knocked me out"  "knocked me out" Other reaction(s): Other (See Comments) "knocked me out"  Peanut Butter Flavor Itching   Current Outpatient Medications on File Prior to Visit  Medication Sig Dispense Refill  ALPRAZolam (XANAX) 0.5 MG tablet Take by mouth  amLODIPine-olmesartan (AZOR) 5-20 mg tablet Take 1 tablet by mouth once daily  atorvastatin (LIPITOR) 40 MG tablet Take 40 mg by mouth once daily  diclofenac (VOLTAREN) 75 MG EC tablet Take 75 mg by mouth 2 (two) times daily  escitalopram oxalate (LEXAPRO) 20 MG tablet Take 20 mg by mouth once daily   potassium chloride (KLOR-CON M20) 20 MEQ ER tablet Take by mouth   No current facility-administered medications on file prior to visit.   Family History  Problem Relation Age of Onset  High blood pressure (Hypertension) Father    Social History   Tobacco Use  Smoking Status Never  Smokeless Tobacco Never    Social History   Socioeconomic History  Marital status: Married  Tobacco Use  Smoking status: Never  Smokeless tobacco: Never  Vaping Use  Vaping status: Never Used  Substance and Sexual Activity  Alcohol use: Yes  Drug use: Never   Objective:   Vitals:   BP: (!) 160/90  Pulse: 86  Temp: 37.1 C (98.7 F)  SpO2: 98%  Weight: 57 kg (125 lb 9.6 oz)  Height: 154.9 cm (5\' 1" )  PainSc: 0-No pain   Body mass index is 23.73 kg/m.  Physical Exam   She appears well on exam. There are chronic skin changes in her left groin and left proximal medial thigh with draining sinus tracts consistent with hidradenitis. I expressed purulence from these areas.  Labs, Imaging and Diagnostic Testing: Reviewed her notes in the electronic medical records  Assessment and Plan:   Diagnoses and all orders for this visit:  Hidradenitis suppurativa  Other orders - doxycycline (MONODOX) 100 MG capsule; Take 1 capsule (100 mg total) by mouth 2 (two) times daily for 14 days   I discussed the diagnosis with her. This is failed to improve with antibiotics for wide excision of the areas on her left groin and thigh are recommended. I explained the reasonings for this  with her in detail. I also discussed in detail the diagnosis as well as this is not a curable disease and surgery is limited to control the areas of chronic drainage. Her primary care providers could consider Humira as a long-term treatment. She is interested in surgery. We discussed the procedure in detail. We discussed the risks which includes but is not limited to bleeding, infection, injury to surrounding structures,  wound breakdown, having a chronic open wound, cardiopulmonary issues with anesthesia, postoperative recovery, etc. I explained that the sutures will be in at least 2 weeks and that she would have drainage around the sutures as well. She understands and wishes to proceed with surgery which will be scheduled

## 2023-01-10 ENCOUNTER — Ambulatory Visit (HOSPITAL_BASED_OUTPATIENT_CLINIC_OR_DEPARTMENT_OTHER)
Admission: RE | Admit: 2023-01-10 | Discharge: 2023-01-10 | Disposition: A | Discharge status: Home / Self Care | Payer: Commercial Managed Care - PPO | Attending: Surgery | Admitting: Surgery

## 2023-01-10 ENCOUNTER — Encounter (HOSPITAL_BASED_OUTPATIENT_CLINIC_OR_DEPARTMENT_OTHER): Payer: Self-pay | Admitting: Surgery

## 2023-01-10 ENCOUNTER — Ambulatory Visit (HOSPITAL_BASED_OUTPATIENT_CLINIC_OR_DEPARTMENT_OTHER): Payer: Commercial Managed Care - PPO | Admitting: Anesthesiology

## 2023-01-10 ENCOUNTER — Other Ambulatory Visit: Payer: Self-pay

## 2023-01-10 ENCOUNTER — Other Ambulatory Visit (HOSPITAL_COMMUNITY): Payer: Self-pay

## 2023-01-10 ENCOUNTER — Encounter (HOSPITAL_BASED_OUTPATIENT_CLINIC_OR_DEPARTMENT_OTHER)
Admission: RE | Disposition: A | Discharge status: Home / Self Care | Payer: Self-pay | Source: Home / Self Care | Attending: Surgery

## 2023-01-10 DIAGNOSIS — L732 Hidradenitis suppurativa: Secondary | ICD-10-CM | POA: Diagnosis not present

## 2023-01-10 DIAGNOSIS — I10 Essential (primary) hypertension: Secondary | ICD-10-CM | POA: Insufficient documentation

## 2023-01-10 DIAGNOSIS — F32A Depression, unspecified: Secondary | ICD-10-CM | POA: Insufficient documentation

## 2023-01-10 DIAGNOSIS — F419 Anxiety disorder, unspecified: Secondary | ICD-10-CM | POA: Diagnosis not present

## 2023-01-10 HISTORY — PX: HYDRADENITIS EXCISION: SHX5243

## 2023-01-10 SURGERY — EXCISION, HIDRADENITIS, AXILLA
Anesthesia: General | Site: Groin | Laterality: Left

## 2023-01-10 MED ORDER — CLINDAMYCIN PHOSPHATE 900 MG/50ML IV SOLN
INTRAVENOUS | Status: DC | PRN
Start: 2023-01-10 — End: 2023-01-10

## 2023-01-10 MED ORDER — PROPOFOL 10 MG/ML IV BOLUS
INTRAVENOUS | Status: DC | PRN
  Administered 2023-01-10: 150 mg via INTRAVENOUS

## 2023-01-10 MED ORDER — FENTANYL CITRATE (PF) 100 MCG/2ML IJ SOLN
INTRAMUSCULAR | Status: DC | PRN
  Administered 2023-01-10: 50 ug via INTRAVENOUS

## 2023-01-10 MED ORDER — AMISULPRIDE (ANTIEMETIC) 5 MG/2ML IV SOLN: 10.0000 mg | Freq: Once | INTRAVENOUS | Status: DC | PRN

## 2023-01-10 MED ORDER — CIPROFLOXACIN IN D5W 400 MG/200ML IV SOLN
INTRAVENOUS | Status: DC | PRN
Start: 2023-01-10 — End: 2023-01-10
  Administered 2023-01-10: 400 mg via INTRAVENOUS

## 2023-01-10 MED ORDER — BUPIVACAINE-EPINEPHRINE 0.5% -1:200000 IJ SOLN
INTRAMUSCULAR | Status: DC | PRN
  Administered 2023-01-10: 20 mL

## 2023-01-10 MED ORDER — LACTATED RINGERS IV SOLN: INTRAVENOUS | Status: DC | PRN

## 2023-01-10 MED ORDER — KETOROLAC TROMETHAMINE 30 MG/ML IJ SOLN: 30.0000 mg | Freq: Once | INTRAMUSCULAR | Status: DC | PRN

## 2023-01-10 MED ORDER — HYDROCODONE-ACETAMINOPHEN 5-325 MG PO TABS
1.0000 | ORAL_TABLET | Freq: Four times a day (QID) | ORAL | 0 refills | Status: DC | PRN
Start: 2023-01-10 — End: 2024-01-16
  Filled 2023-01-10: qty 30, 8d supply, fill #0

## 2023-01-10 MED ORDER — MIDAZOLAM HCL 2 MG/2ML IJ SOLN
INTRAMUSCULAR | Status: DC | PRN
  Administered 2023-01-10: 2 mg via INTRAVENOUS

## 2023-01-10 MED ORDER — ACETAMINOPHEN 500 MG PO TABS
1000.0000 mg | ORAL_TABLET | ORAL | Status: AC
  Administered 2023-01-10: 1000 mg via ORAL

## 2023-01-10 MED ORDER — ONDANSETRON HCL 4 MG/2ML IJ SOLN
INTRAMUSCULAR | Status: AC
  Filled 2023-01-10: qty 2

## 2023-01-10 MED ORDER — LIDOCAINE 2% (20 MG/ML) 5 ML SYRINGE
INTRAMUSCULAR | Status: AC
  Filled 2023-01-10: qty 5

## 2023-01-10 MED ORDER — DEXAMETHASONE SODIUM PHOSPHATE 10 MG/ML IJ SOLN
INTRAMUSCULAR | Status: DC | PRN
  Administered 2023-01-10: 5 mg via INTRAVENOUS

## 2023-01-10 MED ORDER — PROPOFOL 10 MG/ML IV BOLUS
INTRAVENOUS | Status: AC
  Filled 2023-01-10: qty 20

## 2023-01-10 MED ORDER — LIDOCAINE HCL (CARDIAC) PF 100 MG/5ML IV SOSY
PREFILLED_SYRINGE | INTRAVENOUS | Status: DC | PRN
  Administered 2023-01-10: 60 mg via INTRAVENOUS

## 2023-01-10 MED ORDER — CIPROFLOXACIN IN D5W 400 MG/200ML IV SOLN: 400.0000 mg | INTRAVENOUS | Status: DC

## 2023-01-10 MED ORDER — DEXAMETHASONE SODIUM PHOSPHATE 10 MG/ML IJ SOLN
INTRAMUSCULAR | Status: AC
  Filled 2023-01-10: qty 1

## 2023-01-10 MED ORDER — ACETAMINOPHEN 500 MG PO TABS
ORAL_TABLET | ORAL | Status: AC
  Filled 2023-01-10: qty 2

## 2023-01-10 MED ORDER — LACTATED RINGERS IV SOLN: INTRAVENOUS | Status: DC

## 2023-01-10 MED ORDER — BUPIVACAINE-EPINEPHRINE (PF) 0.5% -1:200000 IJ SOLN
INTRAMUSCULAR | Status: AC
  Filled 2023-01-10: qty 30

## 2023-01-10 MED ORDER — CIPROFLOXACIN IN D5W 400 MG/200ML IV SOLN
INTRAVENOUS | Status: AC
  Filled 2023-01-10: qty 200

## 2023-01-10 MED ORDER — MIDAZOLAM HCL 2 MG/2ML IJ SOLN
INTRAMUSCULAR | Status: AC
  Filled 2023-01-10: qty 2

## 2023-01-10 MED ORDER — HYDRALAZINE HCL 20 MG/ML IJ SOLN
5.0000 mg | INTRAMUSCULAR | Status: DC | PRN
  Administered 2023-01-10 (×2): 5 mg via INTRAVENOUS

## 2023-01-10 MED ORDER — FENTANYL CITRATE (PF) 100 MCG/2ML IJ SOLN
INTRAMUSCULAR | Status: AC
  Filled 2023-01-10: qty 2

## 2023-01-10 MED ORDER — FENTANYL CITRATE (PF) 100 MCG/2ML IJ SOLN: 25.0000 ug | INTRAMUSCULAR | Status: DC | PRN

## 2023-01-10 MED ORDER — HYDRALAZINE HCL 20 MG/ML IJ SOLN
INTRAMUSCULAR | Status: AC
  Filled 2023-01-10: qty 1

## 2023-01-10 MED ORDER — ONDANSETRON HCL 4 MG/2ML IJ SOLN
INTRAMUSCULAR | Status: DC | PRN
  Administered 2023-01-10: 4 mg via INTRAVENOUS

## 2023-01-10 SURGICAL SUPPLY — 34 items
ADH SKN CLS APL DERMABOND .7 (GAUZE/BANDAGES/DRESSINGS) ×1
APL PRP STRL LF DISP 70% ISPRP (MISCELLANEOUS) ×1
BLADE CLIPPER SURG (BLADE) IMPLANT
BLADE SURG 15 STRL LF DISP TIS (BLADE) ×1 IMPLANT
BLADE SURG 15 STRL SS (BLADE) ×1
CANISTER SUCT 1200ML W/VALVE (MISCELLANEOUS) ×1 IMPLANT
CHLORAPREP W/TINT 26 (MISCELLANEOUS) ×1 IMPLANT
COVER BACK TABLE 60X90IN (DRAPES) ×1 IMPLANT
COVER MAYO STAND STRL (DRAPES) ×1 IMPLANT
DERMABOND ADVANCED .7 DNX12 (GAUZE/BANDAGES/DRESSINGS) ×1 IMPLANT
DRAPE LAPAROTOMY 100X72 PEDS (DRAPES) ×1 IMPLANT
DRAPE UTILITY XL STRL (DRAPES) ×1 IMPLANT
ELECT REM PT RETURN 9FT ADLT (ELECTROSURGICAL) ×1
ELECTRODE REM PT RTRN 9FT ADLT (ELECTROSURGICAL) ×1 IMPLANT
GLOVE SURG SIGNA 7.5 PF LTX (GLOVE) ×1 IMPLANT
GOWN STRL REUS W/ TWL LRG LVL3 (GOWN DISPOSABLE) ×1 IMPLANT
GOWN STRL REUS W/ TWL XL LVL3 (GOWN DISPOSABLE) ×1 IMPLANT
GOWN STRL REUS W/TWL LRG LVL3 (GOWN DISPOSABLE) ×1
GOWN STRL REUS W/TWL XL LVL3 (GOWN DISPOSABLE) ×1
NDL HYPO 25X1 1.5 SAFETY (NEEDLE) ×1 IMPLANT
NEEDLE HYPO 25X1 1.5 SAFETY (NEEDLE) ×1
NS IRRIG 1000ML POUR BTL (IV SOLUTION) ×1 IMPLANT
PACK BASIN DAY SURGERY FS (CUSTOM PROCEDURE TRAY) ×1 IMPLANT
PENCIL SMOKE EVACUATOR (MISCELLANEOUS) ×1 IMPLANT
SLEEVE SCD COMPRESS KNEE MED (STOCKING) IMPLANT
SPIKE FLUID TRANSFER (MISCELLANEOUS) IMPLANT
SPONGE T-LAP 4X18 ~~LOC~~+RFID (SPONGE) ×1 IMPLANT
SUT ETHILON 2 0 FS 18 (SUTURE) IMPLANT
SUT ETHILON 3 0 FSL (SUTURE) IMPLANT
SYR BULB EAR ULCER 3OZ GRN STR (SYRINGE) ×1 IMPLANT
SYR CONTROL 10ML LL (SYRINGE) ×1 IMPLANT
TOWEL GREEN STERILE FF (TOWEL DISPOSABLE) ×1 IMPLANT
TUBE CONNECTING 20X1/4 (TUBING) ×1 IMPLANT
YANKAUER SUCT BULB TIP NO VENT (SUCTIONS) ×1 IMPLANT

## 2023-01-10 NOTE — Anesthesia Preprocedure Evaluation (Addendum)
Anesthesia Evaluation  Patient identified by MRN, date of birth, ID band Patient awake    Reviewed: Allergy & Precautions, NPO status , Patient's Chart, lab work & pertinent test results  Airway Mallampati: II  TM Distance: >3 FB Neck ROM: Full    Dental no notable dental hx.    Pulmonary neg pulmonary ROS   Pulmonary exam normal        Cardiovascular hypertension, Pt. on medications Normal cardiovascular exam     Neuro/Psych  PSYCHIATRIC DISORDERS Anxiety Depression    negative neurological ROS     GI/Hepatic negative GI ROS, Neg liver ROS,,,  Endo/Other  negative endocrine ROS    Renal/GU negative Renal ROS     Musculoskeletal negative musculoskeletal ROS (+)    Abdominal   Peds  Hematology negative hematology ROS (+)   Anesthesia Other Findings HIDRAENITIS  Reproductive/Obstetrics                             Anesthesia Physical Anesthesia Plan  ASA: 2  Anesthesia Plan: General   Post-op Pain Management:    Induction: Intravenous  PONV Risk Score and Plan: 3 and Ondansetron, Dexamethasone, Midazolam and Treatment may vary due to age or medical condition  Airway Management Planned: LMA  Additional Equipment:   Intra-op Plan:   Post-operative Plan: Extubation in OR  Informed Consent: I have reviewed the patients History and Physical, chart, labs and discussed the procedure including the risks, benefits and alternatives for the proposed anesthesia with the patient or authorized representative who has indicated his/her understanding and acceptance.     Dental advisory given  Plan Discussed with: CRNA  Anesthesia Plan Comments:        Anesthesia Quick Evaluation

## 2023-01-10 NOTE — Anesthesia Procedure Notes (Signed)
Procedure Name: LMA Insertion Date/Time: 01/10/2023 1:33 PM  Performed by: Karen Kitchens, CRNAPre-anesthesia Checklist: Patient identified, Emergency Drugs available, Suction available and Patient being monitored Patient Re-evaluated:Patient Re-evaluated prior to induction Oxygen Delivery Method: Circle system utilized Preoxygenation: Pre-oxygenation with 100% oxygen Induction Type: IV induction Ventilation: Mask ventilation without difficulty LMA: LMA inserted LMA Size: 3.0 Number of attempts: 1 Airway Equipment and Method: Bite block Placement Confirmation: positive ETCO2, CO2 detector and breath sounds checked- equal and bilateral Tube secured with: Tape Dental Injury: Teeth and Oropharynx as per pre-operative assessment

## 2023-01-10 NOTE — Discharge Instructions (Addendum)
You may shower starting tomorrow  Cover the incisions with dry gauze and change daily and as needed  Expect a moderate amount of drainage from the incisions  You may use ibuprofen and Tylenol as well as an ice pack also for pain  Call the office to confirm your office appointment on October 25  No vigorous activity until the sutures are removed  No Tylenol until after 5:40pm today if needed  Post Anesthesia Home Care Instructions  Activity: Get plenty of rest for the remainder of the day. A responsible individual must stay with you for 24 hours following the procedure.  For the next 24 hours, DO NOT: -Drive a car -Advertising copywriter -Drink alcoholic beverages -Take any medication unless instructed by your physician -Make any legal decisions or sign important papers.  Meals: Start with liquid foods such as gelatin or soup. Progress to regular foods as tolerated. Avoid greasy, spicy, heavy foods. If nausea and/or vomiting occur, drink only clear liquids until the nausea and/or vomiting subsides. Call your physician if vomiting continues.  Special Instructions/Symptoms: Your throat may feel dry or sore from the anesthesia or the breathing tube placed in your throat during surgery. If this causes discomfort, gargle with warm salt water. The discomfort should disappear within 24 hours.  If you had a scopolamine patch placed behind your ear for the management of post- operative nausea and/or vomiting:  1. The medication in the patch is effective for 72 hours, after which it should be removed.  Wrap patch in a tissue and discard in the trash. Wash hands thoroughly with soap and water. 2. You may remove the patch earlier than 72 hours if you experience unpleasant side effects which may include dry mouth, dizziness or visual disturbances. 3. Avoid touching the patch. Wash your hands with soap and water after contact with the patch.

## 2023-01-10 NOTE — Anesthesia Postprocedure Evaluation (Signed)
Anesthesia Post Note  Patient: Alice Ryan  Procedure(s) Performed: WIDE EXCISION HIDRADENITIS LEFT GROIN/THIGH (Left: Groin)     Patient location during evaluation: PACU Anesthesia Type: General Level of consciousness: awake Pain management: pain level controlled Vital Signs Assessment: post-procedure vital signs reviewed and stable Respiratory status: spontaneous breathing, nonlabored ventilation and respiratory function stable Cardiovascular status: blood pressure returned to baseline and stable Postop Assessment: no apparent nausea or vomiting Anesthetic complications: no   No notable events documented.  Last Vitals:  Vitals:   01/10/23 1547 01/10/23 1627  BP: (!) 150/71 (!) 188/79  Pulse:    Resp: 18 18  Temp: (!) 36.2 C   SpO2: 100% 100%    Last Pain:  Vitals:   01/10/23 1534  TempSrc:   PainSc: 3                  Mckenlee Mangham P Olando Willems

## 2023-01-10 NOTE — Transfer of Care (Signed)
Immediate Anesthesia Transfer of Care Note  Patient: Alice Ryan [facility]  Procedure(s) Performed: WIDE EXCISION HIDRADENITIS LEFT GROIN/THIGH (Left: Groin)  Patient Location: PACU  Anesthesia Type:General  Level of Consciousness: awake, alert , and oriented  Airway & Oxygen Therapy: Patient Spontanous Breathing and Patient connected to face mask oxygen  Post-op Assessment: Report given to RN and Post -op Vital signs reviewed and stable  Post vital signs: Reviewed and stable  Last Vitals:  Vitals Value Taken Time  BP 160/140 01/10/23 1406  Temp    Pulse 88 01/10/23 1407  Resp 16 01/10/23 1407  SpO2 100 % 01/10/23 1407  Vitals shown include unfiled device data.  Last Pain:  Vitals:   01/10/23 1130  TempSrc: Temporal  PainSc: 10-Worst pain ever      Patients Stated Pain Goal: 3 (01/10/23 1130)  Complications: No notable events documented.

## 2023-01-10 NOTE — Interval H&P Note (Signed)
History and Physical Interval Note:no change in H and P  01/10/2023 12:42 PM  Alice Ryan  has presented today for surgery, with the diagnosis of HIDRADERITIS.  The various methods of treatment have been discussed with the patient and family. After consideration of risks, benefits and other options for treatment, the patient has consented to  Procedure(s) with comments: WIDE EXCISION HIDRADENITIS LEFT GROIN/THIGH (Left) - LMA as a surgical intervention.  The patient's history has been reviewed, patient examined, no change in status, stable for surgery.  I have reviewed the patient's chart and labs.  Questions were answered to the patient's satisfaction.     Abigail Miyamoto

## 2023-01-10 NOTE — Op Note (Signed)
WIDE EXCISION HIDRADENITIS LEFT GROIN/THIGH  Procedure Note  [facility] 01/10/2023   Pre-op Diagnosis: HIDRADENITIS LEFT GROIN AND THIGH     Post-op Diagnosis: SAMD  Procedure(s): WIDE EXCISION HIDRADENITIS LEFT GROIN/THIGH  Surgeon(s): Abigail Miyamoto, MD  Anesthesia: General  Staff:  Circulator: Maryan Rued, RN Relief Circulator: Macie Burows, RN Scrub Person: Melida Gimenez, CST; Konrad Felix, RN  Estimated Blood Loss: Minimal               Findings: The patient had hidradenitis in her left groin and thigh.  I widely excised an 8 cm x 2 cm portion of skin the left groin and a 4 cm x 1 cm portion of skin along the left medial thigh.  This included subcutaneous tissue at both sites  Procedure: The patient was brought to the operating identifies the correct patient.  She was placed prone on the operating table general anesthesia was induced.  I placed her left leg in a frog-leg position.  Her left groin and thigh were then prepped and draped in usual sterile fashion.  She had multiple draining chronic sinus tracts consistent with hidradenitis along her groin at the inguinal crease and along her medial thigh on the left side.  I anesthetized the skin with Marcaine.  I then performed elliptical incision on the area of the thigh.  This area measured 4 cm x 1 cm.  I dissected down to the subcutaneous tissue circumferentially and excised the area of the chronic granulation tissue and hidradenitis draining sinus tracts.  I achieved hemostasis with the cautery.  I then closed the wound in interrupted horizontal mattress and simple interrupted 2-0 nylon sutures.  I next performed another elliptical incision in the inguinal crease with a scalpel on the left side including all the firm hard areas of hidradenitis with a chronic draining sinus tracts as well.  This measured 8 cm x 2 cm.  I then excised the skin and subcutaneous tissue underlying these areas with  electrocautery as well.  All the visible chronic granulation tissue was removed with the excisions.  I achieved hemostasis again with the cautery.  I anesthetized the incisions further with Marcaine.  I then again closed this incision with interrupted and horizontal mattress 2-0 nylon sutures.  Gauze and tape were then applied.  The patient tolerated the procedure well.  All the counts were correct at the end of the procedure.  She was then extubated in the operating room and taken in stable condition to the recovery room.          Abigail Miyamoto   Date: 01/10/2023  Time: 1:54 PM

## 2023-01-11 ENCOUNTER — Encounter (HOSPITAL_BASED_OUTPATIENT_CLINIC_OR_DEPARTMENT_OTHER): Payer: Self-pay | Admitting: Surgery

## 2023-01-23 ENCOUNTER — Ambulatory Visit (INDEPENDENT_AMBULATORY_CARE_PROVIDER_SITE_OTHER): Payer: Commercial Managed Care - PPO | Admitting: Psychology

## 2023-01-23 ENCOUNTER — Other Ambulatory Visit: Payer: Self-pay | Admitting: Family Medicine

## 2023-01-23 DIAGNOSIS — F330 Major depressive disorder, recurrent, mild: Secondary | ICD-10-CM

## 2023-01-23 DIAGNOSIS — F411 Generalized anxiety disorder: Secondary | ICD-10-CM | POA: Diagnosis not present

## 2023-01-23 DIAGNOSIS — Z780 Asymptomatic menopausal state: Secondary | ICD-10-CM
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
## 2023-02-06 ENCOUNTER — Ambulatory Visit: Payer: Commercial Managed Care - PPO | Admitting: Psychology

## 2023-02-06 NOTE — Progress Notes (Unsigned)
° ° ° ° ° ° ° ° ° ° ° ° ° ° °  Alice Belling Ann Anddy Wingert, PhD °

## 2023-03-06 ENCOUNTER — Other Ambulatory Visit (HOSPITAL_COMMUNITY): Payer: Self-pay

## 2023-03-06 ENCOUNTER — Ambulatory Visit: Payer: Commercial Managed Care - PPO | Admitting: Psychology

## 2023-03-06 DIAGNOSIS — F339 Major depressive disorder, recurrent, unspecified: Secondary | ICD-10-CM | POA: Diagnosis not present

## 2023-03-06 DIAGNOSIS — F411 Generalized anxiety disorder: Secondary | ICD-10-CM

## 2023-03-06 MED ORDER — DOXYCYCLINE MONOHYDRATE 100 MG PO CAPS
100.0000 mg | ORAL_CAPSULE | Freq: Two times a day (BID) | ORAL | 0 refills | Status: AC
  Filled 2023-03-06: qty 14, 7d supply, fill #0
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
## 2023-03-16 ENCOUNTER — Other Ambulatory Visit (HOSPITAL_COMMUNITY): Payer: Self-pay

## 2023-03-20 ENCOUNTER — Ambulatory Visit: Payer: Commercial Managed Care - PPO | Admitting: Psychology

## 2023-03-20 DIAGNOSIS — F411 Generalized anxiety disorder: Secondary | ICD-10-CM

## 2023-03-20 DIAGNOSIS — F330 Major depressive disorder, recurrent, mild: Secondary | ICD-10-CM
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
## 2023-04-03 ENCOUNTER — Ambulatory Visit: Payer: Commercial Managed Care - PPO | Admitting: Psychology

## 2023-04-06 ENCOUNTER — Other Ambulatory Visit (HOSPITAL_COMMUNITY): Payer: Self-pay

## 2023-04-17 ENCOUNTER — Ambulatory Visit: Payer: Commercial Managed Care - PPO | Admitting: Psychology

## 2023-04-17 DIAGNOSIS — F330 Major depressive disorder, recurrent, mild: Secondary | ICD-10-CM | POA: Diagnosis not present

## 2023-04-17 DIAGNOSIS — F411 Generalized anxiety disorder: Secondary | ICD-10-CM
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
## 2023-05-01 ENCOUNTER — Ambulatory Visit: Payer: Commercial Managed Care - PPO | Admitting: Psychology

## 2023-05-01 ENCOUNTER — Other Ambulatory Visit: Payer: Self-pay | Admitting: Family Medicine

## 2023-05-01 ENCOUNTER — Other Ambulatory Visit (HOSPITAL_COMMUNITY): Payer: Self-pay

## 2023-05-01 DIAGNOSIS — L732 Hidradenitis suppurativa: Secondary | ICD-10-CM | POA: Diagnosis not present

## 2023-05-01 DIAGNOSIS — Z1231 Encounter for screening mammogram for malignant neoplasm of breast: Secondary | ICD-10-CM

## 2023-05-01 MED ORDER — CLINDAMYCIN PHOSPHATE 1 % EX GEL
1.0000 | Freq: Two times a day (BID) | CUTANEOUS | 2 refills | Status: DC
  Filled 2023-05-01: qty 30, 15d supply, fill #0

## 2023-05-01 MED ORDER — DOXYCYCLINE MONOHYDRATE 100 MG PO CAPS
100.0000 mg | ORAL_CAPSULE | Freq: Two times a day (BID) | ORAL | 1 refills | Status: DC
  Filled 2023-05-01: qty 20, 10d supply, fill #0

## 2023-05-12 ENCOUNTER — Ambulatory Visit
Admission: RE | Admit: 2023-05-12 | Discharge: 2023-05-12 | Disposition: A | Discharge status: Home / Self Care | Payer: Commercial Managed Care - PPO | Source: Ambulatory Visit | Attending: Family Medicine | Admitting: Family Medicine

## 2023-05-12 DIAGNOSIS — Z1231 Encounter for screening mammogram for malignant neoplasm of breast: Secondary | ICD-10-CM

## 2023-05-15 ENCOUNTER — Ambulatory Visit: Payer: Commercial Managed Care - PPO | Admitting: Psychology

## 2023-05-17 ENCOUNTER — Other Ambulatory Visit: Payer: Self-pay | Admitting: Family Medicine

## 2023-05-17 ENCOUNTER — Telehealth: Payer: Self-pay

## 2023-05-17 DIAGNOSIS — R928 Other abnormal and inconclusive findings on diagnostic imaging of breast: Secondary | ICD-10-CM

## 2023-05-17 NOTE — Telephone Encounter (Signed)
Copied from CRM [id number]. Topic: Clinical - Lab/Test Results >> May 17, 2023 10:26 AM Corin V wrote: Reason for CRM: Patient asked for a call back from nursing to go over her mammogram results.

## 2023-05-17 NOTE — Telephone Encounter (Signed)
I left patient a voicemail to call the office back. Mammogram showed dense breast tissue, diagnostic mammo & ultrasounds were ordered by breast imaging center and are scheduled to be done on 2/25.

## 2023-05-18 NOTE — Telephone Encounter (Signed)
Copied from CRM [id number]. Topic: General - Other >> May 17, 2023  4:44 PM Eunice Blase wrote: Reason for CRM: Pt returning call pt stated has appt for Mammogram on 05/27/2023 at [facility] [place] [address]. Please call pt at [phone]

## 2023-05-19 NOTE — Telephone Encounter (Signed)
I spoke with patient. She was just wanting clarification on the results. She was unsure why she needed the additional testing, advised they saw dense tissue and also had concerns of a mass. She did have a surgery prior and was wondering if that it what it was picking up. Pt will discuss with the provider that does the ultrasound on 3/11 to see if it can be removed.

## 2023-05-27 ENCOUNTER — Other Ambulatory Visit: Payer: Commercial Managed Care - PPO

## 2023-05-29 ENCOUNTER — Ambulatory Visit (INDEPENDENT_AMBULATORY_CARE_PROVIDER_SITE_OTHER): Payer: Commercial Managed Care - PPO | Admitting: Psychology

## 2023-05-29 DIAGNOSIS — F411 Generalized anxiety disorder: Secondary | ICD-10-CM | POA: Diagnosis not present

## 2023-05-29 DIAGNOSIS — F330 Major depressive disorder, recurrent, mild: Secondary | ICD-10-CM | POA: Diagnosis not present
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
## 2023-06-12 ENCOUNTER — Ambulatory Visit: Payer: Commercial Managed Care - PPO | Admitting: Psychology

## 2023-06-12 DIAGNOSIS — F330 Major depressive disorder, recurrent, mild: Secondary | ICD-10-CM

## 2023-06-12 DIAGNOSIS — F411 Generalized anxiety disorder: Secondary | ICD-10-CM | POA: Diagnosis not present

## 2023-06-12 DIAGNOSIS — F331 Major depressive disorder, recurrent, moderate: Secondary | ICD-10-CM
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
## 2023-06-13 ENCOUNTER — Ambulatory Visit
Admission: RE | Admit: 2023-06-13 | Discharge: 2023-06-13 | Disposition: A | Discharge status: Home / Self Care | Payer: Commercial Managed Care - PPO | Source: Ambulatory Visit | Attending: Family Medicine | Admitting: Family Medicine

## 2023-06-13 DIAGNOSIS — R928 Other abnormal and inconclusive findings on diagnostic imaging of breast: Secondary | ICD-10-CM

## 2023-06-13 DIAGNOSIS — N6011 Diffuse cystic mastopathy of right breast: Secondary | ICD-10-CM | POA: Diagnosis not present

## 2023-06-13 DIAGNOSIS — N6315 Unspecified lump in the right breast, overlapping quadrants: Secondary | ICD-10-CM | POA: Diagnosis not present

## 2023-06-13 DIAGNOSIS — N6321 Unspecified lump in the left breast, upper outer quadrant: Secondary | ICD-10-CM | POA: Diagnosis not present

## 2023-06-13 DIAGNOSIS — N6012 Diffuse cystic mastopathy of left breast: Secondary | ICD-10-CM | POA: Diagnosis not present

## 2023-06-26 ENCOUNTER — Ambulatory Visit (INDEPENDENT_AMBULATORY_CARE_PROVIDER_SITE_OTHER): Payer: Commercial Managed Care - PPO | Admitting: Psychology

## 2023-06-26 DIAGNOSIS — F411 Generalized anxiety disorder: Secondary | ICD-10-CM

## 2023-06-26 DIAGNOSIS — F330 Major depressive disorder, recurrent, mild: Secondary | ICD-10-CM

## 2023-06-26 DIAGNOSIS — F331 Major depressive disorder, recurrent, moderate: Secondary | ICD-10-CM
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
## 2023-07-07 ENCOUNTER — Other Ambulatory Visit (HOSPITAL_COMMUNITY): Payer: Self-pay

## 2023-07-07 ENCOUNTER — Other Ambulatory Visit (HOSPITAL_COMMUNITY): Payer: Self-pay | Admitting: Otolaryngology

## 2023-07-10 ENCOUNTER — Ambulatory Visit (INDEPENDENT_AMBULATORY_CARE_PROVIDER_SITE_OTHER): Payer: Commercial Managed Care - PPO | Admitting: Psychology

## 2023-07-10 DIAGNOSIS — F411 Generalized anxiety disorder: Secondary | ICD-10-CM | POA: Diagnosis not present

## 2023-07-10 DIAGNOSIS — F330 Major depressive disorder, recurrent, mild: Secondary | ICD-10-CM
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
## 2023-07-19 ENCOUNTER — Other Ambulatory Visit (HOSPITAL_COMMUNITY): Payer: Self-pay

## 2023-07-24 ENCOUNTER — Ambulatory Visit: Payer: Commercial Managed Care - PPO | Admitting: Psychology

## 2023-08-07 ENCOUNTER — Ambulatory Visit (INDEPENDENT_AMBULATORY_CARE_PROVIDER_SITE_OTHER): Payer: Commercial Managed Care - PPO | Admitting: Psychology

## 2023-08-07 DIAGNOSIS — F411 Generalized anxiety disorder: Secondary | ICD-10-CM | POA: Diagnosis not present

## 2023-08-07 DIAGNOSIS — F330 Major depressive disorder, recurrent, mild: Secondary | ICD-10-CM | POA: Diagnosis not present
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
## 2023-08-21 ENCOUNTER — Ambulatory Visit: Payer: Commercial Managed Care - PPO | Admitting: Psychology

## 2023-08-31 ENCOUNTER — Inpatient Hospital Stay: Admission: RE | Admit: 2023-08-31 | Payer: Commercial Managed Care - PPO | Source: Ambulatory Visit

## 2023-09-04 ENCOUNTER — Ambulatory Visit (INDEPENDENT_AMBULATORY_CARE_PROVIDER_SITE_OTHER): Admitting: Psychology

## 2023-09-04 DIAGNOSIS — F330 Major depressive disorder, recurrent, mild: Secondary | ICD-10-CM

## 2023-09-04 DIAGNOSIS — F411 Generalized anxiety disorder: Secondary | ICD-10-CM
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
## 2023-09-18 ENCOUNTER — Ambulatory Visit: Admitting: Psychology

## 2023-09-18 DIAGNOSIS — F411 Generalized anxiety disorder: Secondary | ICD-10-CM
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
## 2023-10-02 ENCOUNTER — Ambulatory Visit: Admitting: Psychology

## 2023-10-16 ENCOUNTER — Ambulatory Visit: Admitting: Psychology

## 2023-10-18 DIAGNOSIS — M1711 Unilateral primary osteoarthritis, right knee: Secondary | ICD-10-CM | POA: Diagnosis not present

## 2023-10-23 ENCOUNTER — Other Ambulatory Visit: Payer: Self-pay | Admitting: Family Medicine

## 2023-10-23 ENCOUNTER — Other Ambulatory Visit (HOSPITAL_COMMUNITY): Payer: Self-pay

## 2023-10-23 DIAGNOSIS — F411 Generalized anxiety disorder: Secondary | ICD-10-CM

## 2023-10-23 MED ORDER — TRAMADOL HCL 50 MG PO TABS
50.0000 mg | ORAL_TABLET | Freq: Four times a day (QID) | ORAL | 0 refills | Status: DC | PRN
  Filled 2023-10-23: qty 20, 5d supply, fill #0

## 2023-10-23 MED ORDER — DOXYCYCLINE HYCLATE 100 MG PO CAPS
100.0000 mg | ORAL_CAPSULE | Freq: Two times a day (BID) | ORAL | 0 refills | Status: DC
  Filled 2023-10-23: qty 20, 10d supply, fill #0

## 2023-10-25 ENCOUNTER — Other Ambulatory Visit (HOSPITAL_COMMUNITY): Payer: Self-pay

## 2023-10-26 ENCOUNTER — Other Ambulatory Visit (HOSPITAL_COMMUNITY): Payer: Self-pay

## 2023-10-26 DIAGNOSIS — L732 Hidradenitis suppurativa: Secondary | ICD-10-CM | POA: Diagnosis not present

## 2023-10-26 MED ORDER — CEPHALEXIN 500 MG PO CAPS
500.0000 mg | ORAL_CAPSULE | Freq: Three times a day (TID) | ORAL | 0 refills | Status: DC
  Filled 2023-10-26: qty 9, 3d supply, fill #0

## 2023-10-30 ENCOUNTER — Ambulatory Visit (INDEPENDENT_AMBULATORY_CARE_PROVIDER_SITE_OTHER): Admitting: Psychology

## 2023-10-30 DIAGNOSIS — F411 Generalized anxiety disorder: Secondary | ICD-10-CM

## 2023-10-30 DIAGNOSIS — F330 Major depressive disorder, recurrent, mild: Secondary | ICD-10-CM
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
## 2023-11-01 ENCOUNTER — Other Ambulatory Visit (HOSPITAL_COMMUNITY): Payer: Self-pay

## 2023-11-01 ENCOUNTER — Encounter: Payer: Self-pay | Admitting: Family Medicine

## 2023-11-01 ENCOUNTER — Ambulatory Visit: Admitting: Family Medicine

## 2023-11-01 VITALS — BP 140/82 | HR 70 | Resp 16 | Ht 61.0 in | Wt 128.0 lb

## 2023-11-01 DIAGNOSIS — Z01818 Encounter for other preprocedural examination: Secondary | ICD-10-CM

## 2023-11-01 DIAGNOSIS — R7303 Prediabetes: Secondary | ICD-10-CM | POA: Insufficient documentation

## 2023-11-01 DIAGNOSIS — F411 Generalized anxiety disorder: Secondary | ICD-10-CM | POA: Diagnosis not present

## 2023-11-01 DIAGNOSIS — I10 Essential (primary) hypertension: Secondary | ICD-10-CM

## 2023-11-01 DIAGNOSIS — F331 Major depressive disorder, recurrent, moderate: Secondary | ICD-10-CM

## 2023-11-01 DIAGNOSIS — R87810 Cervical high risk human papillomavirus (HPV) DNA test positive: Secondary | ICD-10-CM | POA: Diagnosis not present

## 2023-11-01 DIAGNOSIS — E785 Hyperlipidemia, unspecified: Secondary | ICD-10-CM | POA: Diagnosis not present

## 2023-11-01 MED ORDER — ALPRAZOLAM 0.5 MG PO TABS
0.2500 mg | ORAL_TABLET | Freq: Two times a day (BID) | ORAL | 1 refills | Status: AC | PRN
  Filled 2023-11-01: qty 30, 15d supply, fill #0
  Filled 2024-02-23 – 2024-03-06 (×2): qty 30, 15d supply, fill #1

## 2023-11-01 NOTE — Patient Instructions (Addendum)
 A few things to remember from today's visit:  Essential hypertension  Depression, major, recurrent, moderate (HCC)  GAD (generalized anxiety disorder)  Hyperlipidemia, unspecified hyperlipidemia type  Pre-op evaluation  Prediabetes  No changes today. Take blood pressure medication daily and let me know about readings in 3-4 weeks. Pre op labs/EKG 2-3 weeks before surgery.  If you need refills for medications you take chronically, please call your pharmacy. Do not use My Chart to request refills or for acute issues that need immediate attention. If you send a my chart message, it may take a few days to be addressed, specially if I am not in the office.  Please be sure medication list is accurate. If a new problem present, please set up appointment sooner than planned today.

## 2023-11-01 NOTE — Assessment & Plan Note (Signed)
 Recently exacerbated by the death of her father. Currently on Lexapro  20 mg daily. Continue CBT.

## 2023-11-01 NOTE — Progress Notes (Signed)
 ACUTE VISIT Chief Complaint  Patient presents with   surgery clearance   Discussed the use of AI scribe software for clinical note transcription with the patient, who gave verbal consent to proceed.  History of Present Illness Alice Ryan is a 63 year old female with a PMHx significant for HTN, HLD, allergic rhinitis, and anxiety, who is here today for Pro-op Clearance for Right TKA on 01/30/2024 by Dr. Donnice Car.  She is scheduled for a total knee replacement on October 28th for her right knee. She previously underwent a right knee arthroscopy in March 2021 for a meniscus issue. No chest pain, difficulty breathing, or palpitations during activities like climbing stairs or carrying groceries.  She has a history of anxiety, which has increased following her father's death in 07/27/23. She takes alprazolam  as needed and citalopram 10 mg daily. She attends psychotherapy sessions with Dr. Jackson every two to three weeks.  She has hypertension and is currently taking amlodipine /olmesartan  5/20 mg daily. She has been inconsistent with her medication, recently resuming it today after a period of non-compliance. She also takes atorvastatin  40 mg for hyperlipidemia and potassium for hypokalemia.  She experienced a reaction to doxycycline , prescribed for an infection following a tunnel surgery, which caused throat soreness and difficulty swallowing. She was switched to cephalexin . She reports indigestion and heartburn, which she attributes to the doxycycline .  Her past surgical history includes a hidradenitis incision in October last year under general anesthesia, with no complications reported. She also had a hemorrhoidectomy in March 2018.  She had a Pap smear in September last year, which was negative for malignancy but positive for high-risk HPV. She is sexually active.   Her most recent surgery was Hydradenitis Excision by Dr. Vernetta on 01/10/2023.  She says that there were no  complications during/after procedure or with anesthesia during/after procedure, however has recently been seen by Dr. Vernetta for irration in the area of the hydradenitis, which was treated initially with Doxycycline , but had a reaction to this so was switched to Cephalexin   Counseled on medications to avoid for 1 week prior to/following procedure as well as medications she can take up to 1 day prior to procedure.   Hypertension:  Medications: Amlodipine -Olmesartan  5-20 mg daily - resumed this today, had been on a short hiatus while taking care of her father. BP readings at home: not checked Side effects: none mentioned Negative for cough, wheezing, difficulty breathing, unusual or severe headache, visual changes, exertional chest pain, dyspnea,  focal weakness, or edema. BP Readings from Last 3 Encounters:  11/01/23 (!) 140/82  01/10/23 (!) 188/79  12/21/22 126/80   Lab Results  Component Value Date   CREATININE 0.75 12/21/2022   BUN 14 12/21/2022   NA 142 12/21/2022   K 3.3 (L) 12/21/2022   CL 107 12/21/2022   CO2 27 12/21/2022   Hyperlipidemia: Currently on Atorvastatin  40 mg daily. Lab Results  Component Value Date   CHOL 210 (H) 12/21/2022   HDL 76.50 12/21/2022   LDLCALC 117 (H) 12/21/2022   LDLDIRECT 165.0 02/26/2013   TRIG 84.0 12/21/2022   CHOLHDL 3 12/21/2022   Taking Xanax  and Lexapro  20 mg daily for Anxiety. Discussed her recent Situational Stress in 27-Jul-2023 while taking care of her father, who unfortunately passed at 51 y.o in 07/27/23 following a seizure.  For this, she is seeing Dr. Jackson for counseling,   Taking Potassium for Hypokalemia. Past Surgical History:  Procedure Laterality Date   BREAST EXCISIONAL BIOPSY  Left 2011   benign   BREAST LUMPECTOMY WITH RADIOACTIVE SEED LOCALIZATION Right 12/13/2018   Procedure: RIGHT BREAST LUMPECTOMY WITH RADIOACTIVE SEED LOCALIZATION;  Surgeon: Vanderbilt Ned, MD;  Location: [facility];  Service: General;   Laterality: Right;   CHONDROPLASTY Right 06/25/2019   Procedure: CHONDROPLASTY;  Surgeon: Jane Charleston, MD;  Location: [facility];  Service: Orthopedics;  Laterality: Right;   CYST EXCISION PERINEAL N/A 06/13/2016   Procedure: CYST EXCISION PERIANAL, CHRONIC INFECTED;  Surgeon: Lynwood Pina, MD;  Location: [facility];  Service: General;  Laterality: N/A;   EVALUATION UNDER ANESTHESIA WITH HEMORRHOIDECTOMY N/A 06/13/2016   Procedure: EXAM UNDER ANESTHESIA,  HEMORRHOIDECTOMY;  Surgeon: Lynwood Pina, MD;  Location: [facility];  Service: General;  Laterality: N/A;   HYDRADENITIS EXCISION Left 01/10/2023   Procedure: WIDE EXCISION HIDRADENITIS LEFT GROIN/THIGH;  Surgeon: Vernetta Berg, MD;  Location: [facility];  Service: General;  Laterality: Left;  LMA   KNEE ARTHROSCOPY WITH MEDIAL MENISECTOMY Right 06/25/2019   Procedure: KNEE ARTHROSCOPY WITH MEDIAL MENISECTOMY;  Surgeon: Jane Charleston, MD;  Location: [facility];  Service: Orthopedics;  Laterality: Right;   lumpectomy right breast  2011   benign   TRIGGER FINGER RELEASE Right 07/31/2017   Procedure: RELEASE TRIGGER FINGER/A-1 PULLEY RIGHT RING FINGER;  Surgeon: Murrell Drivers, MD;  Location:  SUR[facility];  Service: Orthopedics;  Laterality: Right;   Family History  Problem Relation Age of Onset   Cancer Mother        breast cancer at age 47   Stroke Mother    Hypertension Mother    Breast cancer Mother 58   Hypertension Father    Review of Systems  Constitutional:  Negative for activity change, appetite change, fatigue, fever and unexpected weight change.  HENT:  Negative for facial swelling and trouble swallowing.   Eyes:  Negative for visual disturbance.  Respiratory:  Negative for cough, shortness of breath and wheezing.   Cardiovascular:  Negative for chest pain, palpitations and leg swelling.  Gastrointestinal:  Negative for abdominal pain,  nausea and vomiting.  Genitourinary:  Negative for dysuria and menstrual problem.  Musculoskeletal:  Negative for gait problem and myalgias.  Skin:  Negative for rash.  Neurological:  Negative for syncope, weakness and headaches.  Psychiatric/Behavioral:  Negative for confusion.    See other pertinent positives and negatives in HPI.  Current Outpatient Medications on File Prior to Visit  Medication Sig Dispense Refill   amLODipine -olmesartan  (AZOR ) 5-20 MG tablet Take 1 tablet by mouth daily. 90 tablet 3   atorvastatin  (LIPITOR) 40 MG tablet Take 1 tablet (40 mg total) by mouth daily. 90 tablet 2   clindamycin  (CLINDAGEL ) 1 % gel Apply 1 Application topically 2 (two) times daily. 30 g 2   diclofenac  (VOLTAREN ) 75 MG EC tablet Take 1 tablet (75 mg total) by mouth 2 (two) times daily. 60 tablet 1   diclofenac  Sodium (VOLTAREN ) 1 % GEL Apply 4 g topically 4 (four) times daily. 100 g 0   doxycycline  (PERIOSTAT ) 20 MG tablet Take 1 tablet (20 mg total) by mouth 2 (two) times daily for periodontal infection until medicine is gone. 60 tablet 5   EPINEPHrine  (EPIPEN  2-PAK) 0.3 mg/0.3 mL IJ SOAJ injection Inject 0.3 mg into the muscle as needed for anaphylaxis. 2 each 2   escitalopram  (LEXAPRO ) 20 MG tablet Take 1 tablet (20 mg total) by mouth daily. 90 tablet 1   HYDROcodone -acetaminophen  (NORCO)  5-325 MG tablet Take 1 tablet by mouth every 6 (six) hours as needed for moderate pain. 30 tablet 0   mometasone  (NASONEX ) 50 MCG/ACT nasal spray Place 2 sprays into the nose daily. 17 g 6   potassium chloride  SA (KLOR-CON  M) 20 MEQ tablet Take 1 tablet (20 mEq total) by mouth daily for 5 days. 5 tablet 0   traMADol  (ULTRAM ) 50 MG tablet Take 1 tablet (50 mg total) by mouth every 6 (six) hours as needed for Pain for up to 5 days 20 tablet 0   triamcinolone  cream (KENALOG ) 0.1 % Apply 1 application topically daily as needed. 45 g 1   valACYclovir  (VALTREX ) 1000 MG tablet TAKE 2 TABLETS BY MOUTH TWICE DAILY  FOR 1 DAY. TAKE AT FIST SIGNS OF OUTBREAK. 30 tablet 1   No current facility-administered medications on file prior to visit.    Past Medical History:  Diagnosis Date   Acid reflux    Acute lateral meniscus tear of right knee    Acute medial meniscus tear of right knee    Anxiety    Blood in stool    GERD (gastroesophageal reflux disease)    History of kidney stones    Hypercholesteremia    Hyperlipidemia    Hypertension    Hypertension    Allergies  Allergen Reactions   Apple Juice Anaphylaxis   Latex Anaphylaxis    Latex condom   Chlorhexidine  Gluconate Itching   Flounder [Fish Allergy] Swelling   Other Swelling   Oxycodone-Acetaminophen  Other (See Comments)    knocked me out Other reaction(s): Other (See Comments) knocked me out   Peanut Butter Flavoring Agent (Non-Screening) Itching    Social History   Socioeconomic History   Marital status: Married    Spouse name: Not on file   Number of children: Not on file   Years of education: Not on file   Highest education level: Not on file  Occupational History   Not on file  Tobacco Use   Smoking status: Never   Smokeless tobacco: Never  Substance and Sexual Activity   Alcohol use: Yes    Comment: social   Drug use: No   Sexual activity: Not on file  Other Topics Concern   Not on file  Social History Narrative   Not on file   Social Drivers of Health   Financial Resource Strain: Not on file  Food Insecurity: Not on file  Transportation Needs: Not on file  Physical Activity: Not on file  Stress: Not on file  Social Connections: Not on file    Vitals:   11/01/23 1135 11/01/23 1417  BP: (!) 140/82 (!) 140/82  Pulse: 70   Resp: 16   SpO2: 99%    Body mass index is 24.19 kg/m.  Physical Exam Vitals and nursing note reviewed.  Constitutional:      General: She is not in acute distress.    Appearance: She is well-developed.  HENT:     Head: Normocephalic and atraumatic.     Mouth/Throat:      Mouth: Mucous membranes are moist.     Pharynx: Oropharynx is clear.  Eyes:     Conjunctiva/sclera: Conjunctivae normal.  Cardiovascular:     Rate and Rhythm: Normal rate and regular rhythm.     Pulses:          Dorsalis pedis pulses are 2+ on the right side and 2+ on the left side.     Heart sounds: No murmur heard.  Pulmonary:     Effort: Pulmonary effort is normal. No respiratory distress.     Breath sounds: Normal breath sounds.  Abdominal:     Palpations: Abdomen is soft. There is no hepatomegaly or mass.     Tenderness: There is no abdominal tenderness.  Lymphadenopathy:     Cervical: No cervical adenopathy.  Skin:    General: Skin is warm.     Findings: No erythema or rash.  Neurological:     General: No focal deficit present.     Mental Status: She is alert and oriented to person, place, and time.     Cranial Nerves: No cranial nerve deficit.     Gait: Gait normal.  Psychiatric:     Comments: Well groomed, good eye contact.   ASSESSMENT AND PLAN: [name] was seen here today for Pre-op Evaluation.  Essential hypertension Assessment & Plan: BP is not adequately controlled. She has not been consistent with taking her antihypertensive medication, resumed it today. Continue Amlodipine -olmesartan  5-20 mg daily. Monitor BP at home, instructed to let me know about BP readings in 3 to 4 weeks. Continue low-salt diet.   Depression, major, recurrent, moderate (HCC) Assessment & Plan: Recently exacerbated by the death of her father. Currently on Lexapro  20 mg daily. Continue CBT.   GAD (generalized anxiety disorder) Assessment & Plan: Aggravated by recent father's death. Continue Lexapro  20 mg daily and CBT. She would like a refill on Xanax  0.5 mg, which she has taken in the past daily as needed.   Orders: -     ALPRAZolam ; Take 1/2-1 tablet (0.25-0.5 mg total) by mouth 2 (two) times daily as needed for anxiety.  Dispense: 30 tablet; Refill:  1  Hyperlipidemia, unspecified hyperlipidemia type Assessment & Plan: Continue atorvastatin  10 mg daily and low-fat diet. Fasting lipid panel will be added to next blood work.   Pre-op evaluation  Prediabetes Assessment & Plan: Consistency with a healthy lifestyle encouraged for diabetes prevention. Last hemoglobin A1c was 5.7 in 12/2022. We will add hemoglobin A1c to her next blood work.     Return in about 6 months (around 05/03/2024) for CPE. I,Emily Lagle,acting as a Neurosurgeon for Amber Williard [place], MD.,have documented all relevant documentation on the behalf of Alice Schlup [place], MD,as directed by  Relda Agosto [place], MD while in the presence of Pawan Knechtel [place], MD.  *** (refresh reminder)  I, Charliee Krenz [place], MD, have reviewed all documentation for this visit. The documentation on 11/01/23 for the exam, diagnosis, procedures, and orders are all accurate and complete. Alice Eberle G. [place], MD  [facility]. [place] office.  Discharge Instructions   None

## 2023-11-01 NOTE — Assessment & Plan Note (Signed)
 Aggravated by recent father's death. Continue Lexapro  20 mg daily and CBT. She would like a refill on Xanax  0.5 mg, which she has taken in the past daily as needed.

## 2023-11-01 NOTE — Assessment & Plan Note (Signed)
 Consistency with a healthy lifestyle encouraged for diabetes prevention. Last hemoglobin A1c was 5.7 in 12/2022. We will add hemoglobin A1c to her next blood work.

## 2023-11-01 NOTE — Assessment & Plan Note (Signed)
 BP is not adequately controlled. She has not been consistent with taking her antihypertensive medication, resumed it today. Discussed possible complications of elevated BP. Continue Amlodipine -olmesartan  5-20 mg daily. Monitor BP at home, instructed to let me know about BP readings in 3 to 4 weeks. Continue low-salt diet.

## 2023-11-01 NOTE — Assessment & Plan Note (Signed)
 Continue atorvastatin  10 mg daily and low-fat diet. Fasting lipid panel will be added to next blood work.

## 2023-11-09 ENCOUNTER — Other Ambulatory Visit (HOSPITAL_COMMUNITY): Payer: Self-pay

## 2023-11-09 MED ORDER — OMRON 3 SERIES BP MONITOR DEVI
0 refills | Status: DC
  Filled 2023-11-09: qty 1, 30d supply, fill #0

## 2023-11-13 ENCOUNTER — Ambulatory Visit (INDEPENDENT_AMBULATORY_CARE_PROVIDER_SITE_OTHER): Admitting: Psychology

## 2023-11-13 ENCOUNTER — Other Ambulatory Visit (HOSPITAL_COMMUNITY): Payer: Self-pay

## 2023-11-13 DIAGNOSIS — F411 Generalized anxiety disorder: Secondary | ICD-10-CM | POA: Diagnosis not present

## 2023-11-13 DIAGNOSIS — L732 Hidradenitis suppurativa: Secondary | ICD-10-CM | POA: Diagnosis not present

## 2023-11-13 DIAGNOSIS — F330 Major depressive disorder, recurrent, mild: Secondary | ICD-10-CM

## 2023-11-13 MED ORDER — CLINDAMYCIN PHOS (TWICE-DAILY) 1 % EX GEL
1.0000 | Freq: Two times a day (BID) | CUTANEOUS | 2 refills | Status: DC
  Filled 2023-11-13: qty 30, 15d supply, fill #0
  Filled 2024-01-05: qty 30, 15d supply, fill #1
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
## 2023-11-27 ENCOUNTER — Ambulatory Visit (INDEPENDENT_AMBULATORY_CARE_PROVIDER_SITE_OTHER): Admitting: Psychology

## 2023-11-27 DIAGNOSIS — F411 Generalized anxiety disorder: Secondary | ICD-10-CM | POA: Diagnosis not present

## 2023-11-27 DIAGNOSIS — F330 Major depressive disorder, recurrent, mild: Secondary | ICD-10-CM | POA: Diagnosis not present
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
## 2023-12-06 DIAGNOSIS — M25661 Stiffness of right knee, not elsewhere classified: Secondary | ICD-10-CM | POA: Diagnosis not present

## 2023-12-06 DIAGNOSIS — M25561 Pain in right knee: Secondary | ICD-10-CM | POA: Diagnosis not present

## 2023-12-11 ENCOUNTER — Ambulatory Visit (INDEPENDENT_AMBULATORY_CARE_PROVIDER_SITE_OTHER): Admitting: Psychology

## 2023-12-11 DIAGNOSIS — F330 Major depressive disorder, recurrent, mild: Secondary | ICD-10-CM

## 2023-12-11 DIAGNOSIS — F411 Generalized anxiety disorder: Secondary | ICD-10-CM
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
## 2023-12-25 ENCOUNTER — Ambulatory Visit: Admitting: Psychology

## 2023-12-25 DIAGNOSIS — F411 Generalized anxiety disorder: Secondary | ICD-10-CM | POA: Diagnosis not present

## 2023-12-25 DIAGNOSIS — F330 Major depressive disorder, recurrent, mild: Secondary | ICD-10-CM | POA: Diagnosis not present
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
## 2024-01-05 ENCOUNTER — Other Ambulatory Visit: Payer: Self-pay

## 2024-01-05 ENCOUNTER — Other Ambulatory Visit (HOSPITAL_COMMUNITY): Payer: Self-pay

## 2024-01-08 ENCOUNTER — Ambulatory Visit: Admitting: Psychology

## 2024-01-09 ENCOUNTER — Other Ambulatory Visit

## 2024-01-17 ENCOUNTER — Other Ambulatory Visit (HOSPITAL_COMMUNITY): Payer: Self-pay

## 2024-01-17 MED ORDER — FLUZONE 0.5 ML IM SUSY
0.5000 mL | PREFILLED_SYRINGE | Freq: Once | INTRAMUSCULAR | 0 refills | Status: AC
  Filled 2024-01-17: qty 0.5, 1d supply, fill #0

## 2024-01-17 NOTE — Progress Notes (Signed)
 COVID Vaccine received:  []  No [x]  Yes Date of any COVID positive Test in last 90 days: None  PCP - Betty Swaziland, MD  Cardiologist - Rober Chroman, MD   Chest x-ray - 11-12-2016 2v  Epic EKG -  01-2023  Epic   repeat Stress Test -  ECHO -  Cardiac Cath -  CT Coronary Calcium  score:   Pacemaker / ICD device [x]  No []  Yes   Spinal Cord Stimulator:[x]  No []  Yes       History of Sleep Apnea? [x]  No []  Yes   CPAP used?- [x]  No []  Yes    Patient has: []  NO Hx DM   [x]  Pre-DM   []  DM1  []   DM2 Does the patient monitor blood sugar?   []  N/A   [x]  No []  Yes  Last A1c was: 5.7  on  12-21-22   No Meds     Blood Thinner / Instructions:  none Aspirin Instructions:  none  Dental hx: []  Dentures:  [x]  N/A      []  Bridge or Partial:                   []  Loose or Damaged teeth:   Comments:   Activity level: Able to walk up 2 flights of stairs without becoming significantly short of breath or having chest pain?  []  No   [x]    Yes  Patient can perform ADLs without assistance. []  No   [x]   Yes  Anesthesia review: HTN, GAD, Pre-DM- no meds,   Patient denies any S&S of respiratory illness or Covid - no shortness of breath, fever, cough or chest pain at PAT appointment.  Patient verbalized understanding and agreement to the Pre-Surgical Instructions that were given to them at this PAT appointment. Patient was also educated of the need to review these PAT instructions again prior to her surgery.I reviewed the appropriate phone numbers to call if they have any and questions or concerns.

## 2024-01-17 NOTE — Patient Instructions (Signed)
 SURGICAL WAITING ROOM VISITATION Patients having surgery or a procedure may have no more than 2 support people in the waiting area - these visitors may rotate in the visitor waiting room.   If the patient needs to stay at the hospital during part of their recovery, the visitor guidelines for inpatient rooms apply.  PRE-OP VISITATION  Pre-op nurse will coordinate an appropriate time for 1 support person to accompany the patient in pre-op.  This support person may not rotate.  This visitor will be contacted when the time is appropriate for the visitor to come back in the pre-op area.  Please refer to the [facility] website for the visitor guidelines for Inpatients (after your surgery is over and you are in a regular room).  You are not required to quarantine at this time prior to your surgery. However, you must do this: Hand Hygiene often Do NOT share personal items Notify your provider if you are in close contact with someone who has COVID or you develop fever 100.4 or greater, new onset of sneezing, cough, sore throat, shortness of breath or body aches.  If you test positive for Covid or have been in contact with anyone that has tested positive in the last 10 days please notify you surgeon.    Your procedure is scheduled on:  TUESDAY  01-30-24   Report to [facility] Main Entrance: [address] entrance where the [employer] is available.   Report to admitting at: 10:00 AM  Call this number if you have any questions or problems the morning of surgery [phone]  Do not eat food after Midnight the night prior to your surgery/procedure.  After Midnight you may have the following liquids until  09:30  AM  DAY OF SURGERY  Clear Liquid Diet Water Black Coffee (sugar ok, NO MILK/CREAM OR CREAMERS)  Tea (sugar ok, NO MILK/CREAM OR CREAMERS) regular and decaf                             Plain Jell-O  with no fruit (NO RED)                                           Fruit ices (not  with fruit pulp, NO RED)                                     Popsicles (NO RED)                                                                  Juice: NO CITRUS JUICES: only apple, WHITE grape, WHITE cranberry Sports drinks like Gatorade or Powerade (NO RED)                   The day of surgery:  Drink ONE (1) Pre-Surgery G2 at   09:30 AM the morning of surgery. Drink in one sitting. Do not sip.  This drink was given to you during your hospital pre-op appointment visit. Nothing else to drink after completing the Pre-Surgery  G2 : No candy, chewing gum or throat lozenges.    FOLLOW ANY ADDITIONAL PRE OP INSTRUCTIONS YOU RECEIVED FROM YOUR SURGEON'S OFFICE!!!   Oral Hygiene is also important to reduce your risk of infection.        Remember - BRUSH YOUR TEETH THE MORNING OF SURGERY WITH YOUR REGULAR TOOTHPASTE  Do NOT smoke after Midnight the night before surgery.  STOP TAKING all Vitamins, Herbs and supplements 1 week before your surgery.   Take ONLY these medicines the morning of surgery with A SIP OF WATER: escitalopram  (Lexapro ) and Alprazolam  if needed for anxiety. You may use your Flonase  nasal spray if needed.   DO NOT TAKE AMLODIPINE -OLMESARTAN  the morning of your surgery.                    You may not have any metal on your body including hair pins, jewelry, and body piercing  Do not wear make-up, lotions, powders, perfumes or deodorant  Do not wear nail polish including gel and S&S, artificial / acrylic nails, or any other type of covering on natural nails including finger and toenails. If you have artificial nails, gel coating, etc., that needs to be removed by a nail salon, Please have this removed prior to surgery. Not doing so may mean that your surgery could be cancelled or delayed if the Surgeon or anesthesia staff feels like they are unable to monitor you safely.   Do not shave 48 hours prior to surgery to avoid nicks in your skin which may contribute to  postoperative infections.    Contacts, Hearing Aids, dentures or bridgework may not be worn into surgery. DENTURES WILL BE REMOVED PRIOR TO SURGERY PLEASE DO NOT APPLY Poly grip OR ADHESIVES!!!  You may bring a small overnight bag with you on the day of surgery, only pack items that are not valuable. [facility] IS NOT RESPONSIBLE   FOR VALUABLES THAT ARE LOST OR STOLEN.   Do not bring your home medications to the hospital. The Pharmacy will dispense medications listed on your medication list to you during your admission in the Hospital.  Please read over the following fact sheets you were given: IF YOU HAVE QUESTIONS ABOUT YOUR PRE-OP INSTRUCTIONS, PLEASE CALL [phone].     SINCE YOU HAVE A CHG (chlorahexidine gluconate) ALLERGY Please follow the following instructions:   START TAKING DAILY SHOWERS STARTING FRIDAY 01-26-24.  [facility] - Preparing for Surgery Before surgery, you can play an important role.  Because skin is not sterile, your skin needs to be as free of germs as possible.  You can reduce the number of germs on your skin by washing with Antibacterial soap before surgery.  . Do not shave (including legs and underarms) for at least 48 hours prior to the first shower.  You may shave your face/neck.  Please follow these instructions carefully:  1.  Shower with antibacterial Soap the night before surgery and the  morning of surgery.  2.  If you choose to wash your hair, wash your hair first as usual with your normal  shampoo.  3.  After you shampoo, rinse your hair and body thoroughly to remove the shampoo.                             4.  You can apply soap directly to the skin and wash.  Gently with a scrungie or clean washcloth.  5.  Wash  face,  Genitals (private parts) with your normal soap.             6.  Wash thoroughly, paying special attention to the area where your  surgery  will be performed.  7.  Thoroughly rinse your body with warm water from the neck  down.  8.   Pat yourself dry with a clean towel.             9  Wear clean pajamas.            10 Place clean sheets on your bed the night of your first shower and do not  sleep with pets.  ON THE DAY OF SURGERY : Do not apply any lotions/deodorants the morning of surgery.  Please wear clean clothes to the hospital/surgery center.  FAILURE TO FOLLOW THESE INSTRUCTIONS MAY RESULT IN THE CANCELLATION OF YOUR SURGERY  PATIENT SIGNATURE_________________________________  NURSE SIGNATURE__________________________________       Incentive Spirometer    An incentive spirometer is a tool that can help keep your lungs clear and active. This tool measures how well you are filling your lungs with each breath. Taking long deep breaths may help reverse or decrease the chance of developing breathing (pulmonary) problems (especially infection) following: A long period of time when you are unable to move or be active. BEFORE THE PROCEDURE  If the spirometer includes an indicator to show your best effort, your nurse or respiratory therapist will set it to a desired goal. If possible, sit up straight or lean slightly forward. Try not to slouch. Hold the incentive spirometer in an upright position. INSTRUCTIONS FOR USE  Sit on the edge of your bed if possible, or sit up as far as you can in bed or on a chair. Hold the incentive spirometer in an upright position. Breathe out normally. Place the mouthpiece in your mouth and seal your lips tightly around it. Breathe in slowly and as deeply as possible, raising the piston or the ball toward the top of the column. Hold your breath for 3-5 seconds or for as long as possible. Allow the piston or ball to fall to the bottom of the column. Remove the mouthpiece from your mouth and breathe out normally. Rest for a few seconds and repeat Steps 1 through 7 at least 10 times every 1-2 hours when you are awake. Take your time and take a few normal breaths between  deep breaths. The spirometer may include an indicator to show your best effort. Use the indicator as a goal to work toward during each repetition. After each set of 10 deep breaths, practice coughing to be sure your lungs are clear. If you have an incision (the cut made at the time of surgery), support your incision when coughing by placing a pillow or rolled up towels firmly against it. Once you are able to get out of bed, walk around indoors and cough well. You may stop using the incentive spirometer when instructed by your caregiver.  RISKS AND COMPLICATIONS Take your time so you do not get dizzy or light-headed. If you are in pain, you may need to take or ask for pain medication before doing incentive spirometry. It is harder to take a deep breath if you are having pain. AFTER USE Rest and breathe slowly and easily. It can be helpful to keep track of a log of your progress. Your caregiver can provide you with a simple table to help with this. If you are using the  spirometer at home, follow these instructions: SEEK MEDICAL CARE IF:  You are having difficultly using the spirometer. You have trouble using the spirometer as often as instructed. Your pain medication is not giving enough relief while using the spirometer. You develop fever of 100.5 F (38.1 C) or higher.                                                                                                    SEEK IMMEDIATE MEDICAL CARE IF:  You cough up bloody sputum that had not been present before. You develop fever of 102 F (38.9 C) or greater. You develop worsening pain at or near the incision site. MAKE SURE YOU:  Understand these instructions. Will watch your condition. Will get help right away if you are not doing well or get worse. Document Released: 08/01/2006 Document Revised: 06/13/2011 Document Reviewed: 10/02/2006 [facility] Patient Information 2014 [place], [place].      If you would like to see a video about  joint replacement:   [email or web address]

## 2024-01-18 ENCOUNTER — Other Ambulatory Visit: Payer: Self-pay

## 2024-01-18 ENCOUNTER — Encounter (HOSPITAL_COMMUNITY): Payer: Self-pay

## 2024-01-18 ENCOUNTER — Encounter (HOSPITAL_COMMUNITY)
Admission: RE | Admit: 2024-01-18 | Discharge: 2024-01-18 | Disposition: A | Discharge status: Home / Self Care | Source: Ambulatory Visit | Attending: Orthopedic Surgery | Admitting: Orthopedic Surgery

## 2024-01-18 VITALS — BP 140/64 | HR 71 | Temp 98.2°F | Resp 16 | Ht 61.0 in | Wt 131.0 lb

## 2024-01-18 DIAGNOSIS — I10 Essential (primary) hypertension: Secondary | ICD-10-CM | POA: Diagnosis not present

## 2024-01-18 DIAGNOSIS — R7303 Prediabetes: Secondary | ICD-10-CM | POA: Diagnosis not present

## 2024-01-18 DIAGNOSIS — R9431 Abnormal electrocardiogram [ECG] [EKG]: Secondary | ICD-10-CM | POA: Diagnosis not present

## 2024-01-18 DIAGNOSIS — Z01818 Encounter for other preprocedural examination: Secondary | ICD-10-CM | POA: Diagnosis not present

## 2024-01-18 DIAGNOSIS — Z01812 Encounter for preprocedural laboratory examination: Secondary | ICD-10-CM | POA: Diagnosis present

## 2024-01-18 DIAGNOSIS — Z01810 Encounter for preprocedural cardiovascular examination: Secondary | ICD-10-CM | POA: Diagnosis present

## 2024-01-18 DIAGNOSIS — M1711 Unilateral primary osteoarthritis, right knee: Secondary | ICD-10-CM | POA: Insufficient documentation

## 2024-01-18 HISTORY — DX: Unspecified osteoarthritis, unspecified site: M19.90

## 2024-01-18 HISTORY — DX: Chronic kidney disease, unspecified: N18.9

## 2024-01-18 LAB — CBC
HCT: 37.6 % (ref 36.0–46.0)
Hemoglobin: 11.2 g/dL — ABNORMAL LOW (ref 12.0–15.0)
MCH: 25.7 pg — ABNORMAL LOW (ref 26.0–34.0)
MCHC: 29.8 g/dL — ABNORMAL LOW (ref 30.0–36.0)
MCV: 86.4 fL (ref 80.0–100.0)
Platelets: 449 K/uL — ABNORMAL HIGH (ref 150–400)
RBC: 4.35 MIL/uL (ref 3.87–5.11)
RDW: 14.3 % (ref 11.5–15.5)
WBC: 9.1 K/uL (ref 4.0–10.5)
nRBC: 0 % (ref 0.0–0.2)

## 2024-01-18 LAB — BASIC METABOLIC PANEL WITH GFR
Anion gap: 12 (ref 5–15)
BUN: 14 mg/dL (ref 8–23)
CO2: 21 mmol/L — ABNORMAL LOW (ref 22–32)
Calcium: 9.6 mg/dL (ref 8.9–10.3)
Chloride: 107 mmol/L (ref 98–111)
Creatinine, Ser: 0.71 mg/dL (ref 0.44–1.00)
GFR, Estimated: >60 mL/min (ref >60)
Glucose, Bld: 83 mg/dL (ref 70–99)
Potassium: 4.5 mmol/L (ref 3.5–5.1)
Sodium: 140 mmol/L (ref 135–145)

## 2024-01-18 LAB — SURGICAL PCR SCREEN
MRSA, PCR: NEGATIVE
Staphylococcus aureus: NEGATIVE

## 2024-01-22 ENCOUNTER — Ambulatory Visit: Admitting: Psychology

## 2024-01-22 DIAGNOSIS — F411 Generalized anxiety disorder: Secondary | ICD-10-CM | POA: Diagnosis not present

## 2024-01-22 DIAGNOSIS — F330 Major depressive disorder, recurrent, mild: Secondary | ICD-10-CM | POA: Diagnosis not present
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
## 2024-01-29 NOTE — Anesthesia Preprocedure Evaluation (Signed)
 Anesthesia Evaluation  Patient identified by MRN, date of birth, ID band Patient awake    Reviewed: Allergy & Precautions, NPO status , Patient's Chart, lab work & pertinent test results  History of Anesthesia Complications Negative for: history of anesthetic complications  Airway Mallampati: II  TM Distance: >3 FB Neck ROM: Full    Dental no notable dental hx.    Pulmonary neg pulmonary ROS   Pulmonary exam normal        Cardiovascular hypertension, Pt. on medications Normal cardiovascular exam     Neuro/Psych   Anxiety Depression    negative neurological ROS     GI/Hepatic Neg liver ROS,GERD  Medicated and Controlled,,  Endo/Other  negative endocrine ROS    Renal/GU      Musculoskeletal  (+) Arthritis ,    Abdominal   Peds  Hematology negative hematology ROS (+)   Anesthesia Other Findings   Reproductive/Obstetrics                              Anesthesia Physical Anesthesia Plan  ASA: 2  Anesthesia Plan: Spinal   Post-op Pain Management: Tylenol  PO (pre-op)* and Regional block*   Induction:   PONV Risk Score and Plan: Treatment may vary due to age or medical condition, Ondansetron , Propofol  infusion and Dexamethasone   Airway Management Planned: Natural Airway and Simple Face Mask  Additional Equipment:   Intra-op Plan:   Post-operative Plan:   Informed Consent: I have reviewed the patients History and Physical, chart, labs and discussed the procedure including the risks, benefits and alternatives for the proposed anesthesia with the patient or authorized representative who has indicated his/her understanding and acceptance.       Plan Discussed with: CRNA  Anesthesia Plan Comments:          Anesthesia Quick Evaluation

## 2024-01-30 ENCOUNTER — Ambulatory Visit (HOSPITAL_COMMUNITY): Payer: Self-pay | Admitting: Physician Assistant

## 2024-01-30 ENCOUNTER — Other Ambulatory Visit: Payer: Self-pay

## 2024-01-30 ENCOUNTER — Encounter (HOSPITAL_COMMUNITY): Payer: Self-pay | Admitting: Orthopedic Surgery

## 2024-01-30 ENCOUNTER — Encounter (HOSPITAL_COMMUNITY)
Admission: RE | Disposition: A | Discharge status: Home / Self Care | Payer: Self-pay | Source: Ambulatory Visit | Attending: Orthopedic Surgery

## 2024-01-30 ENCOUNTER — Observation Stay (HOSPITAL_COMMUNITY)
Admission: RE | Admit: 2024-01-30 | Discharge: 2024-01-31 | Disposition: A | Discharge status: Home / Self Care | Source: Ambulatory Visit | Attending: Orthopedic Surgery | Admitting: Orthopedic Surgery

## 2024-01-30 ENCOUNTER — Ambulatory Visit (HOSPITAL_COMMUNITY): Payer: Self-pay | Admitting: Anesthesiology

## 2024-01-30 DIAGNOSIS — N189 Chronic kidney disease, unspecified: Secondary | ICD-10-CM | POA: Diagnosis not present

## 2024-01-30 DIAGNOSIS — M1711 Unilateral primary osteoarthritis, right knee: Secondary | ICD-10-CM

## 2024-01-30 DIAGNOSIS — Z96651 Presence of right artificial knee joint: Principal | ICD-10-CM

## 2024-01-30 DIAGNOSIS — Z79899 Other long term (current) drug therapy: Secondary | ICD-10-CM | POA: Insufficient documentation

## 2024-01-30 DIAGNOSIS — Z91040 Latex allergy status: Secondary | ICD-10-CM | POA: Insufficient documentation

## 2024-01-30 DIAGNOSIS — Z7982 Long term (current) use of aspirin: Secondary | ICD-10-CM | POA: Insufficient documentation

## 2024-01-30 DIAGNOSIS — G8918 Other acute postprocedural pain: Secondary | ICD-10-CM | POA: Diagnosis not present

## 2024-01-30 DIAGNOSIS — I129 Hypertensive chronic kidney disease with stage 1 through stage 4 chronic kidney disease, or unspecified chronic kidney disease: Secondary | ICD-10-CM | POA: Diagnosis not present

## 2024-01-30 DIAGNOSIS — M25561 Pain in right knee: Secondary | ICD-10-CM | POA: Diagnosis present

## 2024-01-30 SURGERY — ARTHROPLASTY, KNEE, TOTAL
Anesthesia: Spinal | Site: Knee | Laterality: Right

## 2024-01-30 MED ORDER — POVIDONE-IODINE 10 % EX SWAB: 2.0000 | Freq: Once | CUTANEOUS | Status: DC

## 2024-01-30 MED ORDER — METHOCARBAMOL 500 MG PO TABS
500.0000 mg | ORAL_TABLET | Freq: Four times a day (QID) | ORAL | Status: DC | PRN
  Administered 2024-01-30 – 2024-01-31 (×2): 500 mg via ORAL
  Filled 2024-01-30 (×2): qty 1

## 2024-01-30 MED ORDER — HYDROCODONE-ACETAMINOPHEN 5-325 MG PO TABS
1.0000 | ORAL_TABLET | ORAL | Status: DC | PRN
  Administered 2024-01-30 – 2024-01-31 (×2): 2 via ORAL
  Filled 2024-01-30 (×2): qty 2

## 2024-01-30 MED ORDER — CLONIDINE HCL (ANALGESIA) 100 MCG/ML EP SOLN
EPIDURAL | Status: DC | PRN
  Administered 2024-01-30: 100 ug

## 2024-01-30 MED ORDER — ONDANSETRON HCL 4 MG PO TABS: 4.0000 mg | ORAL_TABLET | Freq: Four times a day (QID) | ORAL | Status: DC | PRN

## 2024-01-30 MED ORDER — METHOCARBAMOL 1000 MG/10ML IJ SOLN: 500.0000 mg | Freq: Four times a day (QID) | INTRAMUSCULAR | Status: DC | PRN

## 2024-01-30 MED ORDER — IRBESARTAN 150 MG PO TABS
150.0000 mg | ORAL_TABLET | Freq: Every day | ORAL | Status: DC
  Administered 2024-01-31: 150 mg via ORAL
  Filled 2024-01-30: qty 1

## 2024-01-30 MED ORDER — MENTHOL 3 MG MT LOZG: 1.0000 | LOZENGE | OROMUCOSAL | Status: DC | PRN

## 2024-01-30 MED ORDER — STERILE WATER FOR IRRIGATION IR SOLN
Status: DC | PRN
  Administered 2024-01-30: 2000 mL

## 2024-01-30 MED ORDER — CEFAZOLIN SODIUM-DEXTROSE 2-4 GM/100ML-% IV SOLN
2.0000 g | INTRAVENOUS | Status: AC
  Administered 2024-01-30: 2 g via INTRAVENOUS
  Filled 2024-01-30: qty 100

## 2024-01-30 MED ORDER — DEXAMETHASONE SOD PHOSPHATE PF 10 MG/ML IJ SOLN
8.0000 mg | Freq: Once | INTRAMUSCULAR | Status: AC
  Administered 2024-01-30: 8 mg via INTRAVENOUS

## 2024-01-30 MED ORDER — SODIUM CHLORIDE 0.9 % IV SOLN: INTRAVENOUS | Status: DC

## 2024-01-30 MED ORDER — SODIUM CHLORIDE (PF) 0.9 % IJ SOLN
INTRAMUSCULAR | Status: AC
  Filled 2024-01-30: qty 30

## 2024-01-30 MED ORDER — ASPIRIN 81 MG PO CHEW
81.0000 mg | CHEWABLE_TABLET | Freq: Two times a day (BID) | ORAL | Status: DC
Start: 2024-01-30 — End: 2024-01-31
  Administered 2024-01-30 – 2024-01-31 (×2): 81 mg via ORAL
  Filled 2024-01-30 (×2): qty 1

## 2024-01-30 MED ORDER — MIDAZOLAM HCL (PF) 2 MG/2ML IJ SOLN
1.0000 mg | Freq: Once | INTRAMUSCULAR | Status: AC
Start: 2024-01-30 — End: 2024-01-30
  Administered 2024-01-30: 1 mg via INTRAVENOUS
  Filled 2024-01-30: qty 2

## 2024-01-30 MED ORDER — METOCLOPRAMIDE HCL 5 MG/ML IJ SOLN: 5.0000 mg | Freq: Three times a day (TID) | INTRAMUSCULAR | Status: DC | PRN

## 2024-01-30 MED ORDER — HYDROMORPHONE HCL 1 MG/ML IJ SOLN: 0.2500 mg | INTRAMUSCULAR | Status: DC | PRN

## 2024-01-30 MED ORDER — BUPIVACAINE IN DEXTROSE 0.75-8.25 % IT SOLN
INTRATHECAL | Status: DC | PRN
  Administered 2024-01-30: 1.4 mL via INTRATHECAL

## 2024-01-30 MED ORDER — PROPOFOL 1000 MG/100ML IV EMUL
INTRAVENOUS | Status: AC
  Filled 2024-01-30: qty 100

## 2024-01-30 MED ORDER — CEFAZOLIN SODIUM-DEXTROSE 2-4 GM/100ML-% IV SOLN
2.0000 g | Freq: Four times a day (QID) | INTRAVENOUS | Status: AC
  Administered 2024-01-30 – 2024-01-31 (×2): 2 g via INTRAVENOUS
  Filled 2024-01-30 (×2): qty 100

## 2024-01-30 MED ORDER — TRANEXAMIC ACID-NACL 1000-0.7 MG/100ML-% IV SOLN
1000.0000 mg | Freq: Once | INTRAVENOUS | Status: AC
  Administered 2024-01-30: 1000 mg via INTRAVENOUS
  Filled 2024-01-30: qty 100

## 2024-01-30 MED ORDER — ESCITALOPRAM OXALATE 20 MG PO TABS
40.0000 mg | ORAL_TABLET | Freq: Every day | ORAL | Status: DC
  Administered 2024-01-31: 40 mg via ORAL
  Filled 2024-01-30: qty 2

## 2024-01-30 MED ORDER — SODIUM CHLORIDE 0.9 % IR SOLN
Status: DC | PRN
  Administered 2024-01-30: 1000 mL

## 2024-01-30 MED ORDER — ONDANSETRON HCL 4 MG/2ML IJ SOLN: 4.0000 mg | Freq: Four times a day (QID) | INTRAMUSCULAR | Status: DC | PRN

## 2024-01-30 MED ORDER — AMLODIPINE BESYLATE 5 MG PO TABS
5.0000 mg | ORAL_TABLET | Freq: Every day | ORAL | Status: DC
  Administered 2024-01-31: 5 mg via ORAL
  Filled 2024-01-30: qty 1

## 2024-01-30 MED ORDER — TRANEXAMIC ACID-NACL 1000-0.7 MG/100ML-% IV SOLN
1000.0000 mg | INTRAVENOUS | Status: AC
  Administered 2024-01-30: 1000 mg via INTRAVENOUS
  Filled 2024-01-30: qty 100

## 2024-01-30 MED ORDER — POLYETHYLENE GLYCOL 3350 17 G PO PACK
17.0000 g | PACK | Freq: Two times a day (BID) | ORAL | Status: DC
  Administered 2024-01-30 – 2024-01-31 (×2): 17 g via ORAL
  Filled 2024-01-30 (×2): qty 1

## 2024-01-30 MED ORDER — METOCLOPRAMIDE HCL 5 MG PO TABS: 5.0000 mg | ORAL_TABLET | Freq: Three times a day (TID) | ORAL | Status: DC | PRN

## 2024-01-30 MED ORDER — BUPIVACAINE-EPINEPHRINE (PF) 0.5% -1:200000 IJ SOLN
INTRAMUSCULAR | Status: DC | PRN
  Administered 2024-01-30: 15 mL via PERINEURAL

## 2024-01-30 MED ORDER — ONDANSETRON HCL 4 MG/2ML IJ SOLN
INTRAMUSCULAR | Status: DC | PRN
  Administered 2024-01-30: 4 mg via INTRAVENOUS

## 2024-01-30 MED ORDER — 0.9 % SODIUM CHLORIDE (POUR BTL) OPTIME
TOPICAL | Status: DC | PRN
  Administered 2024-01-30: 1000 mL

## 2024-01-30 MED ORDER — DEXAMETHASONE SOD PHOSPHATE PF 10 MG/ML IJ SOLN
10.0000 mg | Freq: Once | INTRAMUSCULAR | Status: AC
  Administered 2024-01-31: 10 mg via INTRAVENOUS

## 2024-01-30 MED ORDER — ALPRAZOLAM 0.25 MG PO TABS
0.2500 mg | ORAL_TABLET | Freq: Two times a day (BID) | ORAL | Status: DC | PRN
  Administered 2024-01-30: 0.5 mg via ORAL
  Filled 2024-01-30: qty 2

## 2024-01-30 MED ORDER — ACETAMINOPHEN 325 MG PO TABS
650.0000 mg | ORAL_TABLET | Freq: Four times a day (QID) | ORAL | Status: DC
  Administered 2024-01-30 – 2024-01-31 (×2): 650 mg via ORAL
  Filled 2024-01-30 (×3): qty 2

## 2024-01-30 MED ORDER — ACETAMINOPHEN 500 MG PO TABS
1000.0000 mg | ORAL_TABLET | Freq: Once | ORAL | Status: AC
  Administered 2024-01-30: 1000 mg via ORAL
  Filled 2024-01-30: qty 2

## 2024-01-30 MED ORDER — MORPHINE SULFATE (PF) 2 MG/ML IV SOLN: 0.5000 mg | INTRAVENOUS | Status: DC | PRN

## 2024-01-30 MED ORDER — DIPHENHYDRAMINE HCL 12.5 MG/5ML PO ELIX: 12.5000 mg | ORAL_SOLUTION | ORAL | Status: DC | PRN

## 2024-01-30 MED ORDER — DROPERIDOL 2.5 MG/ML IJ SOLN: 0.6250 mg | Freq: Once | INTRAMUSCULAR | Status: DC | PRN

## 2024-01-30 MED ORDER — PROPOFOL 500 MG/50ML IV EMUL
INTRAVENOUS | Status: DC | PRN
  Administered 2024-01-30: 40 ug/kg/min via INTRAVENOUS

## 2024-01-30 MED ORDER — ALUM & MAG HYDROXIDE-SIMETH 200-200-20 MG/5ML PO SUSP: 30.0000 mL | ORAL | Status: DC | PRN

## 2024-01-30 MED ORDER — FENTANYL CITRATE (PF) 50 MCG/ML IJ SOSY
50.0000 ug | PREFILLED_SYRINGE | Freq: Once | INTRAMUSCULAR | Status: AC
Start: 2024-01-30 — End: 2024-01-30
  Administered 2024-01-30: 50 ug via INTRAVENOUS
  Filled 2024-01-30: qty 2

## 2024-01-30 MED ORDER — AMLODIPINE-OLMESARTAN 5-20 MG PO TABS: 1.0000 | ORAL_TABLET | Freq: Every day | ORAL | Status: DC

## 2024-01-30 MED ORDER — ORAL CARE MOUTH RINSE: 15.0000 mL | Freq: Once | OROMUCOSAL | Status: DC

## 2024-01-30 MED ORDER — PHENOL 1.4 % MT LIQD: 1.0000 | OROMUCOSAL | Status: DC | PRN

## 2024-01-30 MED ORDER — ATORVASTATIN CALCIUM 40 MG PO TABS
40.0000 mg | ORAL_TABLET | Freq: Every day | ORAL | Status: DC
  Administered 2024-01-30 – 2024-01-31 (×2): 40 mg via ORAL
  Filled 2024-01-30 (×2): qty 1

## 2024-01-30 MED ORDER — KETOROLAC TROMETHAMINE 30 MG/ML IJ SOLN
INTRAMUSCULAR | Status: AC
Start: 2024-01-30 — End: 2024-01-30
  Filled 2024-01-30: qty 1

## 2024-01-30 MED ORDER — SENNA 8.6 MG PO TABS
2.0000 | ORAL_TABLET | Freq: Every day | ORAL | Status: DC
  Filled 2024-01-30: qty 2

## 2024-01-30 MED ORDER — ONDANSETRON HCL 4 MG/2ML IJ SOLN
INTRAMUSCULAR | Status: AC
  Filled 2024-01-30: qty 2

## 2024-01-30 MED ORDER — PROPOFOL 10 MG/ML IV BOLUS
INTRAVENOUS | Status: DC | PRN
  Administered 2024-01-30: 20 mg via INTRAVENOUS

## 2024-01-30 MED ORDER — TRAMADOL HCL 50 MG PO TABS: 50.0000 mg | ORAL_TABLET | Freq: Four times a day (QID) | ORAL | Status: DC | PRN

## 2024-01-30 MED ORDER — SODIUM CHLORIDE (PF) 0.9 % IJ SOLN
INTRAMUSCULAR | Status: DC | PRN
  Administered 2024-01-30: 61 mL

## 2024-01-30 MED ORDER — BISACODYL 10 MG RE SUPP: 10.0000 mg | Freq: Every day | RECTAL | Status: DC | PRN

## 2024-01-30 MED ORDER — BUPIVACAINE-EPINEPHRINE (PF) 0.25% -1:200000 IJ SOLN
INTRAMUSCULAR | Status: AC
  Filled 2024-01-30: qty 30

## 2024-01-30 MED ORDER — CHLORHEXIDINE GLUCONATE 0.12 % MT SOLN: 15.0000 mL | Freq: Once | OROMUCOSAL | Status: DC

## 2024-01-30 MED ORDER — LACTATED RINGERS IV SOLN: INTRAVENOUS | Status: DC

## 2024-01-30 SURGICAL SUPPLY — 46 items
ATTUNE MED ANAT PAT 29 KNEE (Knees) IMPLANT
BAG COUNTER SPONGE SURGICOUNT (BAG) IMPLANT
BAG ZIPLOCK 12X15 (MISCELLANEOUS) ×1 IMPLANT
BASEPLATE TIB CMT FB PCKT SZ3 (Knees) IMPLANT
BLADE SAW SGTL 11.0X1.19X90.0M (BLADE) IMPLANT
BLADE SAW SGTL 13.0X1.19X90.0M (BLADE) ×1 IMPLANT
BNDG ELASTIC 6INX 5YD STR LF (GAUZE/BANDAGES/DRESSINGS) ×1 IMPLANT
BOWL SMART MIX CTS (DISPOSABLE) ×1 IMPLANT
CEMENT HV SMART SET (Cement) ×2 IMPLANT
COMPONENT FEM CMT ATTN NRW 3RT (Joint) IMPLANT
COVER SURGICAL LIGHT HANDLE (MISCELLANEOUS) ×1 IMPLANT
CUFF TRNQT CYL 34X4.125X (TOURNIQUET CUFF) ×1 IMPLANT
DERMABOND ADVANCED .7 DNX12 (GAUZE/BANDAGES/DRESSINGS) ×1 IMPLANT
DRAPE U-SHAPE 47X51 STRL (DRAPES) ×1 IMPLANT
DRESSING AQUACEL AG SP 3.5X10 (GAUZE/BANDAGES/DRESSINGS) ×1 IMPLANT
DRSG AQUACEL AG ADV 3.5X10 (GAUZE/BANDAGES/DRESSINGS) IMPLANT
DURAPREP 26ML APPLICATOR (WOUND CARE) ×2 IMPLANT
ELECT REM PT RETURN 15FT ADLT (MISCELLANEOUS) ×1 IMPLANT
GLOVE BIO SURGEON STRL SZ 6 (GLOVE) ×1 IMPLANT
GLOVE BIOGEL PI IND STRL 6.5 (GLOVE) ×1 IMPLANT
GLOVE BIOGEL PI IND STRL 7.5 (GLOVE) ×1 IMPLANT
GLOVE ORTHO TXT STRL SZ7.5 (GLOVE) ×2 IMPLANT
GOWN STRL REUS W/ TWL LRG LVL3 (GOWN DISPOSABLE) ×2 IMPLANT
HOLDER FOLEY CATH W/STRAP (MISCELLANEOUS) IMPLANT
INSERT TIB CMT ATTUNE 3 5 RT (Insert) IMPLANT
KIT TURNOVER KIT A (KITS) ×1 IMPLANT
MANIFOLD NEPTUNE II (INSTRUMENTS) ×1 IMPLANT
NDL SAFETY ECLIPSE 18X1.5 (NEEDLE) IMPLANT
NS IRRIG 1000ML POUR BTL (IV SOLUTION) ×1 IMPLANT
PACK TOTAL KNEE CUSTOM (KITS) ×1 IMPLANT
PENCIL SMOKE EVACUATOR (MISCELLANEOUS) ×1 IMPLANT
PIN FIX SIGMA LCS THRD HI (PIN) IMPLANT
PROTECTOR NERVE ULNAR (MISCELLANEOUS) ×1 IMPLANT
SET HNDPC FAN SPRY TIP SCT (DISPOSABLE) ×1 IMPLANT
SET PAD KNEE POSITIONER (MISCELLANEOUS) ×1 IMPLANT
SPIKE FLUID TRANSFER (MISCELLANEOUS) ×2 IMPLANT
SUT MNCRL AB 4-0 PS2 18 (SUTURE) ×1 IMPLANT
SUT STRATAFIX PDS+ 0 24IN (SUTURE) ×1 IMPLANT
SUT VIC AB 1 CT1 36 (SUTURE) ×1 IMPLANT
SUT VIC AB 2-0 CT1 TAPERPNT 27 (SUTURE) ×2 IMPLANT
SYR 3ML LL SCALE MARK (SYRINGE) ×1 IMPLANT
TOWEL GREEN STERILE FF (TOWEL DISPOSABLE) ×1 IMPLANT
TRAY FOLEY MTR SLVR 16FR STAT (SET/KITS/TRAYS/PACK) ×1 IMPLANT
TUBE SUCTION HIGH CAP CLEAR NV (SUCTIONS) ×1 IMPLANT
WATER STERILE IRR 1000ML POUR (IV SOLUTION) ×2 IMPLANT
WRAP KNEE MAXI GEL POST OP (GAUZE/BANDAGES/DRESSINGS) ×1 IMPLANT

## 2024-01-30 NOTE — Anesthesia Postprocedure Evaluation (Signed)
 Anesthesia Post Note  Patient: Alice Ryan  Procedure(s) Performed: ARTHROPLASTY, KNEE, TOTAL (Right: Knee)     Patient location during evaluation: PACU Anesthesia Type: Spinal Level of consciousness: awake and alert Pain management: pain level controlled Vital Signs Assessment: post-procedure vital signs reviewed and stable Respiratory status: spontaneous breathing, nonlabored ventilation and respiratory function stable Cardiovascular status: blood pressure returned to baseline Postop Assessment: no apparent nausea or vomiting and spinal receding Anesthetic complications: no   No notable events documented.  Last Vitals:  Vitals:   01/30/24 1430 01/30/24 1445  BP: 110/62 116/60  Pulse: (!) 58 (!) 51  Resp: 13 12  Temp:  36.4 C  SpO2: 94% 95%    Last Pain:  Vitals:   01/30/24 1445  TempSrc:   PainSc: 0-No pain                 Vertell Row

## 2024-01-30 NOTE — Op Note (Signed)
 NAME:  Alice Ryan                      MEDICAL RECORD NO.:  [id number]                             FACILITY:  [facility]      PHYSICIAN:  Donnice BIRCH. Ernie, M.D.  DATE OF BIRTH:  15-May-1960      DATE OF PROCEDURE:  01/30/2024                                     OPERATIVE REPORT         PREOPERATIVE DIAGNOSIS:  Right knee osteoarthritis.      POSTOPERATIVE DIAGNOSIS:  Right knee osteoarthritis.      FINDINGS:  The patient was noted to have complete loss of cartilage and   bone-on-bone arthritis with associated osteophytes in the medial and patellofemoral compartments of   the knee with a significant synovitis and associated effusion.  The patient had failed months of conservative treatment including medications, injection therapy, activity modification.     PROCEDURE:  Right total knee replacement.      COMPONENTS USED:  DePuy Attune FB CR MS knee   system, a size 3N femur, 3 tibia, size 5 mm CR MS AOX insert, and 29 anatomic patellar   button.      SURGEON:  Donnice BIRCH. Ernie, M.D.      ASSISTANT:  Rosina Calin, PA-C.      ANESTHESIA:  General and Spinal.      SPECIMENS:  None.      COMPLICATION:  None.      DRAINS:  None.  EBL: <100 cc      TOURNIQUET TIME:   Total Tourniquet Time Documented: Thigh (Right) - 26 minutes Total: Thigh (Right) - 26 minutes  .      The patient was stable to the recovery room.      INDICATION FOR PROCEDURE:  Alice Ryan is a 63 y.o. female patient of   mine.  The patient had been seen, evaluated, and treated for months conservatively in the   office with medication, activity modification, and injections.  The patient had   radiographic changes of bone-on-bone arthritis with endplate sclerosis and osteophytes noted.  Based on the radiographic changes and failed conservative measures, the patient   decided to proceed with definitive treatment, total knee replacement.  Risks of infection, DVT, component failure, need for  revision surgery, neurovascular injury were reviewed in the office setting.  The postop course was reviewed stressing the efforts to maximize post-operative satisfaction and function.  Consent was obtained for benefit of pain   relief.      PROCEDURE IN DETAIL:  The patient was brought to the operative theater.   Once adequate anesthesia, preoperative antibiotics, 2 gm of Ancef ,1 gm of Tranexamic Acid, and 10 mg of Decadron  administered, the patient was positioned supine with a right thigh tourniquet placed.  The  right lower extremity was prepped and draped in sterile fashion.  A time-   out was performed identifying the patient, planned procedure, and the appropriate extremity.      The right lower extremity was placed in the [facility] leg holder.  The leg was   exsanguinated, tourniquet elevated to 225 mmHg.  A midline incision was  made followed by median parapatellar arthrotomy.  Following initial   exposure, attention was first directed to the patella.  Precut   measurement was noted to be 21 mm.  I resected down to 13 mm and used a   29 anatomic patellar button to restore patellar height as well as cover the cut surface.      The lug holes were drilled and a metal shim was placed to protect the   patella from retractors and saw blade during the procedure.      At this point, attention was now directed to the femur.  The femoral   canal was opened with a drill, irrigated to try to prevent fat emboli.  An   intramedullary rod was passed at 3 degrees valgus, 8 mm of bone was   resected off the distal femur.  Following this resection, the tibia was   subluxated anteriorly.  Using the extramedullary guide, 2 mm of bone was resected off   the proximal medial tibia.  We confirmed the gap would be   stable medially and laterally with a size 5 spacer block as well as confirmed that the tibial cut was perpendicular in the coronal plane, checking with an alignment rod.      Once this was done, I  sized the femur to be a size 3 in the anterior-   posterior dimension, chose a narrow component based on medial and   lateral dimension.  The size 3 rotation block was then pinned in   position anterior referenced using the C-clamp to set rotation.  The   anterior, posterior, and  chamfer cuts were made without difficulty nor   notching making certain that I was along the anterior cortex to help   with flexion gap stability.      The final box cut was made off the lateral aspect of distal femur.      At this point, the tibia was sized to be a size 3.  The size 3 tray was   then pinned in position through the medial third of the tubercle,   drilled, and keel punched.  Trial reduction was now carried with a 3 femur,  3 tibia, a size 5 mm CR MS insert, and the 29 anatomic patella botton.  The knee was brought to full extension with good flexion stability with the patella   tracking through the trochlea without application of pressure.  Given   all these findings the trial components removed.  Final components were   opened and cement was mixed.  The knee was irrigated with normal saline solution and pulse lavage.  The synovial lining was   then injected with 30 cc of 0.25% Marcaine  with epinephrine , 1 cc of Toradol  and 30 cc of NS for a total of 61 cc.     Final implants were then cemented onto cleaned and dried cut surfaces of bone with the knee brought to extension with a size 5 mm CR MS trial insert.      Once the cement had fully cured, excess cement was removed   throughout the knee.  I confirmed that I was satisfied with the range of   motion and stability, and the final size 5 mm CR MS AOX insert was chosen.  It was   placed into the knee.      The tourniquet had been let down at 26 minutes.  No significant   hemostasis was required.  The extensor mechanism was then reapproximated  using #1 Vicryl and #1 Stratafix sutures with the knee   in flexion.  The   remaining wound was closed  with 2-0 Vicryl and running 4-0 Monocryl.   The knee was cleaned, dried, dressed sterilely using Dermabond and   Aquacel dressing.  The patient was then   brought to recovery room in stable condition, tolerating the procedure   well.   Please note that Physician Assistant, Rosina Calin, PA-C was present for the entirety of the case, and was utilized for pre-operative positioning, peri-operative retractor management, general facilitation of the procedure and for primary wound closure at the end of the case.              Donnice CORDOBA Ernie, M.D.    01/30/2024 1:39 PM

## 2024-01-30 NOTE — Evaluation (Signed)
 Physical Therapy Evaluation Patient Details Name: Alice Ryan MRN: [id number] DOB: 01/12/1961 Today's Date: 01/30/2024  History of Present Illness  63 yo female s/p R TKA on 01/30/24. PMH: HTN, GAD, MDD, meniscus tear bil knees, CKD, hidradenitis suppurativa.  Clinical Impression  Pt is s/p TKA resulting in the deficits listed below (see PT Problem List).  Pt agreeable to work with PT; pt very independent at her baseline, works at [employer]; Pt was able to transition supine<>sit with min assist, sit<.stand with min to mod assist ,  further activity/gait limited d/t residual effects of anesthesia/R knee with decr quad activation and buckling in stance.    Pt will benefit from acute skilled PT to increase their independence and safety with mobility to allow discharge.          If plan is discharge home, recommend the following: A little help with bathing/dressing/bathroom;Help with stairs or ramp for entrance;Assist for transportation   Can travel by private vehicle        Equipment Recommendations Rolling walker (2 wheels)  Recommendations for Other Services       Functional Status Assessment Patient has had a recent decline in their functional status and demonstrates the ability to make significant improvements in function in a reasonable and predictable amount of time.     Precautions / Restrictions Precautions Precautions: Fall;Knee Restrictions Weight Bearing Restrictions Per Provider Order: No Other Position/Activity Restrictions: WBAT      Mobility  Bed Mobility Overal bed mobility: Needs Assistance Bed Mobility: Supine to Sit, Sit to Supine     Supine to sit: Min assist Sit to supine: Min assist   General bed mobility comments: assist to progress RLE off and on to bed    Transfers Overall transfer level: Needs assistance Equipment used: Rolling walker (2 wheels) Transfers: Sit to/from Stand Sit to Stand: Min assist, Mod assist           General  transfer comment: cues for hand placement and LE position. assist to rise and transition to RW    Ambulation/Gait               General Gait Details: deferred d/t decr quad activation, R knee buckling with lateral wt shifting  Stairs            Wheelchair Mobility     Tilt Bed    Modified Rankin (Stroke Patients Only)       Balance Overall balance assessment: Needs assistance Sitting-balance support: Feet supported, No upper extremity supported Sitting balance-Leahy Scale: Fair Sitting balance - Comments: not challenged   Standing balance support: Reliant on assistive device for balance, Bilateral upper extremity supported Standing balance-Leahy Scale: Poor                               Pertinent Vitals/Pain Pain Assessment Pain Assessment: Faces Faces Pain Scale: Hurts a little bit Pain Location: right knee Pain Descriptors / Indicators: Sore, Tingling Pain Intervention(s): Limited activity within patient's tolerance, Monitored during session, Repositioned, Ice applied    Home Living Family/patient expects to be discharged to:: Private residence Living Arrangements: Alone Available Help at Discharge: Family Type of Home: House Home Access: Stairs to enter Entrance Stairs-Rails: Right;Left;Can reach both Secretary/administrator of Steps: 3   Home Layout: One level Home Equipment: BSC/3in1      Prior Function Prior Level of Function : Independent/Modified Independent;Working/employed;Driving  Extremity/Trunk Assessment   Upper Extremity Assessment Upper Extremity Assessment: Overall WFL for tasks assessed    Lower Extremity Assessment Lower Extremity Assessment: RLE deficits/detail RLE Deficits / Details: limited AROM d/t residual effects of anesthesia; able to assist with SLR ~ 2+/5 quads/hip flexors       Communication   Communication Communication: No apparent difficulties    Cognition Arousal:  Alert Behavior During Therapy: WFL for tasks assessed/performed   PT - Cognitive impairments: No apparent impairments                         Following commands: Intact       Cueing Cueing Techniques: Verbal cues     General Comments      Exercises Total Joint Exercises Ankle Circles/Pumps: AAROM, Both, 10 reps Quad Sets: AROM, 5 reps, Both Heel Slides: AAROM, Right, 10 reps Straight Leg Raises: AAROM, Right (4x)   Assessment/Plan    PT Assessment Patient needs continued PT services  PT Problem List Decreased strength;Decreased range of motion;Decreased activity tolerance;Decreased balance;Decreased knowledge of use of DME;Decreased mobility       PT Treatment Interventions DME instruction;Therapeutic exercise;Gait training;Functional mobility training;Therapeutic activities;Patient/family education;Stair training    PT Goals (Current goals can be found in the Care Plan section)  Acute Rehab PT Goals PT Goal Formulation: With patient Time For Goal Achievement: 02/06/24 Potential to Achieve Goals: Good    Frequency 7X/week     Co-evaluation               AM-PAC PT 6 Clicks Mobility  Outcome Measure Help needed turning from your back to your side while in a flat bed without using bedrails?: A Little Help needed moving from lying on your back to sitting on the side of a flat bed without using bedrails?: A Little Help needed moving to and from a bed to a chair (including a wheelchair)?: A Little Help needed standing up from a chair using your arms (e.g., wheelchair or bedside chair)?: A Little Help needed to walk in hospital room?: Total Help needed climbing 3-5 steps with a railing? : Total 6 Click Score: 14    End of Session Equipment Utilized During Treatment: Gait belt Activity Tolerance: Patient tolerated treatment well;Other (comment) (limited by residual effects of anesthesia) Patient left: in bed;with call bell/phone within reach;with bed  alarm set Nurse Communication: Mobility status PT Visit Diagnosis: Other abnormalities of gait and mobility (R26.89)    Time: 8346-8280 PT Time Calculation (min) (ACUTE ONLY): 26 min   Charges:   PT Evaluation $PT Eval Low Complexity: 1 Low PT Treatments $Therapeutic Activity: 8-22 mins PT General Charges $$ ACUTE PT VISIT: 1 Visit         Janira Mandell, PT  Acute Rehab Dept [facility]) [phone]  01/30/2024   [facility] 01/30/2024, 5:32 PM

## 2024-01-30 NOTE — Transfer of Care (Signed)
 Immediate Anesthesia Transfer of Care Note  Patient: Alice Ryan  Procedure(s) Performed: ARTHROPLASTY, KNEE, TOTAL (Right: Knee)  Patient Location: PACU  Anesthesia Type:MAC and Spinal  Level of Consciousness: awake, alert , oriented, and patient cooperative  Airway & Oxygen Therapy: Patient Spontanous Breathing and Patient connected to face mask oxygen  Post-op Assessment: Report given to RN and Post -op Vital signs reviewed and stable  Post vital signs: Reviewed and stable  Last Vitals:  Vitals Value Taken Time  BP 129/64 01/30/24 14:00  Temp    Pulse 62 01/30/24 14:02  Resp 13 01/30/24 14:02  SpO2 100 % 01/30/24 14:02  Vitals shown include unfiled device data.  Last Pain:  Vitals:   01/30/24 1220  TempSrc:   PainSc: 0-No pain         Complications: No notable events documented.

## 2024-01-30 NOTE — Anesthesia Procedure Notes (Signed)
 Spinal  Start time: 01/30/2024 12:32 PM End time: 01/30/2024 12:36 PM Reason for block: surgical anesthesia Staffing Performed: resident/CRNA  Resident/CRNA: Memory Armida LABOR, CRNA Performed by: Memory Armida LABOR, CRNA Authorized by: Paul Lamarr BRAVO, MD   Preanesthetic Checklist Completed: patient identified, IV checked, site marked, risks and benefits discussed, surgical consent, monitors and equipment checked, pre-op evaluation and timeout performed Spinal Block Patient position: sitting Prep: DuraPrep and site prepped and draped Patient monitoring: heart rate, cardiac monitor, continuous pulse ox and blood pressure Approach: midline Location: L3-4 Injection technique: single-shot Needle Needle type: Pencan  Needle gauge: 24 G Needle length: 10 cm Additional Notes Pt placed in sitting position for spinal placement. Spinal kit expiration date checked and verified. Lot # [id number]. Sterile prep and drape of back. Site anesthetized. One attempt by CRNA. - heme,+ free flowing clear CSF. Pt tolerated the procedure well. Placed supine after the procedure

## 2024-01-30 NOTE — Interval H&P Note (Signed)
 History and Physical Interval Note:  01/30/2024 11:07 AM  Alice Ryan  has presented today for surgery, with the diagnosis of Right knee osteoarthritis.  The various methods of treatment have been discussed with the patient and family. After consideration of risks, benefits and other options for treatment, the patient has consented to  Procedure(s): ARTHROPLASTY, KNEE, TOTAL (Right) as a surgical intervention.  The patient's history has been reviewed, patient examined, no change in status, stable for surgery.  I have reviewed the patient's chart and labs.  Questions were answered to the patient's satisfaction.     Donnice JONETTA Car

## 2024-01-30 NOTE — H&P (Signed)
 TOTAL KNEE ADMISSION H&P  Patient is being admitted for right total knee arthroplasty.   Therapy Plans: outpatient therapy at [facility] Disposition: Home at her house (friend staying with her) and son to help Planned DVT Prophylaxis: aspirin 81mg  BID DME needed: walker PCP: Betty Jordan - clearance received TXA: IV Allergies: chlorhexadine, latex, apple - anaphylaxis Anesthesia Concerns: none BMI: 24.4 Last HgbA1c: Not diabetic  Other: - staying overnight - husband passed from colon cancer - she is nervous about going through surgery without him - Son getting married 2 days before surgery!!  - Dx of hidradenitis suppurativa treated by Dr. Vernetta previously - cephalexin  (KEFLEX ) 500 MG capsule; Take 1 capsule (500 mg total) by mouth 3 (three) times daily for 3 days - ICE MACHINE at hospital - gets very sleepy with pain meds (tramadol /hydrocodone ), robaxin, tylenol , celebrex    Subjective:  Chief Complaint:right knee pain.  HPI: Alice Ryan [facility], 63 y.o. female, has a history of pain and functional disability in the right knee due to arthritis and has failed non-surgical conservative treatments for greater than 12 weeks to includeNSAID's and/or analgesics, corticosteriod injections, viscosupplementation injections, and activity modification.  Onset of symptoms was gradual, starting 2 years ago with gradually worsening course since that time. The patient noted no past surgery on the right knee(s).  Patient currently rates pain in the right knee(s) at 8 out of 10 with activity. Patient has worsening of pain with activity and weight bearing, pain that interferes with activities of daily living, and pain with passive range of motion.  Patient has evidence of joint space narrowing by imaging studies. There is no active infection.  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   Hyperglycemia 06/30/2015   Hyperlipidemia 06/30/2015   Essential hypertension 07/10/2014   Allergic rhinitis 07/10/2014   Past Medical History:  Diagnosis Date   Acid reflux    Acute lateral meniscus tear of right knee    Acute medial meniscus tear of right knee    Anxiety    Arthritis    Blood in stool    Chronic kidney disease    GERD (gastroesophageal reflux disease)    History of kidney stones    Hypercholesteremia    Hyperlipidemia    Hypertension     Past Surgical History:  Procedure Laterality Date   BREAST EXCISIONAL BIOPSY Left 2011   benign   BREAST LUMPECTOMY WITH RADIOACTIVE SEED LOCALIZATION Right 12/13/2018   Procedure: RIGHT BREAST LUMPECTOMY WITH RADIOACTIVE SEED LOCALIZATION;  Surgeon: Vanderbilt Ned, MD;  Location: [facility];  Service: General;  Laterality: Right;   CHONDROPLASTY Right 06/25/2019   Procedure: CHONDROPLASTY;  Surgeon: Jane Charleston, MD;  Location: [facility];  Service: Orthopedics;  Laterality: Right;   CYST EXCISION PERINEAL N/A 06/13/2016   Procedure: CYST EXCISION PERIANAL, CHRONIC INFECTED;  Surgeon: Lynwood Pina, MD;  Location: [facility];  Service: General;  Laterality: N/A;   EVALUATION UNDER ANESTHESIA WITH HEMORRHOIDECTOMY N/A 06/13/2016   Procedure: EXAM UNDER ANESTHESIA,  HEMORRHOIDECTOMY;  Surgeon: Lynwood Pina, MD;  Location: [facility];  Service: General;  Laterality: N/A;   HYDRADENITIS EXCISION Left 01/10/2023   Procedure: WIDE EXCISION HIDRADENITIS LEFT GROIN/THIGH;  Surgeon: Vernetta Berg, MD;  Location: [facility];  Service: General;  Laterality: Left;  LMA   KNEE ARTHROSCOPY WITH MEDIAL MENISECTOMY Right 06/25/2019   Procedure: KNEE ARTHROSCOPY WITH  MEDIAL MENISECTOMY;  Surgeon: Jane Charleston, MD;  Location: [facility];  Service: Orthopedics;  Laterality: Right;   lumpectomy right breast  2011   benign   TRIGGER FINGER RELEASE Right 07/31/2017   Procedure: RELEASE TRIGGER FINGER/A-1 PULLEY RIGHT RING FINGER;  Surgeon: Murrell Drivers, MD;  Location: [facility];  Service: Orthopedics;  Laterality: Right;    No current facility-administered medications for this encounter.   Current Outpatient Medications  Medication Sig Dispense Refill Last Dose/Taking   ALPRAZolam  (XANAX ) 0.5 MG tablet Take 1/2-1 tablet (0.25-0.5 mg total) by mouth 2 (two) times daily as needed for anxiety. 30 tablet 1 Taking As Needed   amLODipine -olmesartan  (AZOR ) 5-20 MG tablet Take 1 tablet by mouth daily. 90 tablet 3 Taking   atorvastatin  (LIPITOR) 40 MG tablet Take 1 tablet (40 mg total) by mouth daily. 90 tablet 2 Taking   clindamycin  (CLINDAGEL ) 1 % gel Apply 1 Application topically 2 (two) times daily for 15 days. 30 g 2 Taking   diclofenac  Sodium (VOLTAREN ) 1 % GEL Apply 1 Application topically daily as needed (pain.).   Taking As Needed   EPINEPHrine  (EPIPEN  2-PAK) 0.3 mg/0.3 mL IJ SOAJ injection Inject 0.3 mg into the muscle as needed for anaphylaxis. 2 each 2 Taking As Needed   escitalopram  (LEXAPRO ) 20 MG tablet Take 1 tablet (20 mg total) by mouth daily. (Patient taking differently: Take 40 mg by mouth daily.) 90 tablet 1 Taking Differently   fluticasone  (FLONASE ) 50 MCG/ACT nasal spray Place 1 spray into both nostrils daily as needed for allergies.   Taking As Needed   Allergies  Allergen Reactions   Apple Juice Anaphylaxis    NO RAW APPLES   Latex Anaphylaxis    Latex condom   Chlorhexidine  Gluconate Itching   Flounder [Fish Allergy] Swelling   Other Swelling   Oxycodone-Acetaminophen  Other (See Comments)    knocked me out Other reaction(s): Other (See Comments) knocked me out   Peanut Butter Flavoring Agent  (Non-Screening) Itching    Social History   Tobacco Use   Smoking status: Never   Smokeless tobacco: Never  Substance Use Topics   Alcohol use: Yes    Comment: social    Family History  Problem Relation Age of Onset   Cancer Mother        breast cancer at age 9   Stroke Mother    Hypertension Mother    Breast cancer Mother 35   Hypertension Father      Review of Systems  Constitutional:  Negative for chills and fever.  Respiratory:  Negative for cough and shortness of breath.   Cardiovascular:  Negative for chest pain.  Gastrointestinal:  Negative for nausea and vomiting.  Musculoskeletal:  Positive for arthralgias.     Objective:  Physical Exam Right knee exam: No palpable effusion, warmth erythema Slight flexion contracture with flexion of 110 degrees Tenderness mainly medially Slight crepitation with tightness with flexion Normal ipsilateral right hip exam without groin pain or referred pain No lower extremity edema, erythema or calf tenderness   Vital signs in last 24 hours:    Labs:   Estimated body mass index is 24.75 kg/m as calculated from the following:   Height as of 01/18/24: 5' 1 (1.549 m).   Weight as of  01/18/24: 59.4 kg.   Imaging Review Plain radiographs demonstrate severe degenerative joint disease of the right knee(s). The overall alignment isneutral. The bone quality appears to be adequate for age and reported activity level.      Assessment/Plan:  End stage arthritis, right knee   The patient history, physical examination, clinical judgment of the provider and imaging studies are consistent with end stage degenerative joint disease of the right knee(s) and total knee arthroplasty is deemed medically necessary. The treatment options including medical management, injection therapy arthroscopy and arthroplasty were discussed at length. The risks and benefits of total knee arthroplasty were presented and reviewed. The risks due to  aseptic loosening, infection, stiffness, patella tracking problems, thromboembolic complications and other imponderables were discussed. The patient acknowledged the explanation, agreed to proceed with the plan and consent was signed. Patient is being admitted for inpatient treatment for surgery, pain control, PT, OT, prophylactic antibiotics, VTE prophylaxis, progressive ambulation and ADL's and discharge planning. The patient is planning to be discharged home.     Patient's anticipated LOS is less than 2 midnights, meeting these requirements: - Younger than 70 - Lives within 1 hour of care - Has a competent adult at home to recover with post-op recover - NO history of  - Chronic pain requiring opiods  - Diabetes  - Coronary Artery Disease  - Heart failure  - Heart attack  - Stroke  - DVT/VTE  - Cardiac arrhythmia  - Respiratory Failure/COPD  - Renal failure  - Anemia  - Advanced Liver disease  Rosina Calin, PA-C Orthopedic Surgery [facility] [place] [phone]

## 2024-01-30 NOTE — Discharge Instructions (Addendum)

## 2024-01-30 NOTE — Anesthesia Procedure Notes (Signed)
 Anesthesia Regional Block: Adductor canal block   Pre-Anesthetic Checklist: , timeout performed,  Correct Patient, Correct Site, Correct Laterality,  Correct Procedure, Correct Position, site marked,  Risks and benefits discussed,  Pre-op evaluation,  At surgeon's request and post-op pain management  Laterality: Right  Prep: Maximum Sterile Barrier Precautions used, chloraprep       Needles:  Injection technique: Single-shot  Needle Type: Echogenic Stimulator Needle     Needle Length: 9cm  Needle Gauge: 22     Additional Needles:   Procedures:,,,, ultrasound used (permanent image in chart),,    Narrative:  Start time: 01/30/2024 12:16 PM End time: 01/30/2024 12:19 PM Injection made incrementally with aspirations every 5 mL.  Performed by: Personally  Anesthesiologist: Paul Lamarr BRAVO, MD  Additional Notes: Risks, benefits, and alternative discussed. Patient gave consent for procedure. Patient prepped and draped in sterile fashion. Sedation administered, patient remains easily responsive to voice. Relevant anatomy identified with ultrasound guidance. Local anesthetic given in 5cc increments with no signs or symptoms of intravascular injection. No pain or paraesthesias with injection. Patient monitored throughout procedure with no signs of LAST or immediate complications. Tolerated well. Ultrasound image placed in chart.  LANEY Paul, MD

## 2024-01-30 NOTE — Anesthesia Procedure Notes (Signed)
 Procedure Name: MAC Date/Time: 01/30/2024 12:30 PM  Performed by: Memory Armida LABOR, CRNAPre-anesthesia Checklist: Patient identified, Emergency Drugs available, Suction available, Patient being monitored and Timeout performed Patient Re-evaluated:Patient Re-evaluated prior to induction Oxygen Delivery Method: Simple face mask Placement Confirmation: positive ETCO2 Dental Injury: Teeth and Oropharynx as per pre-operative assessment

## 2024-01-31 ENCOUNTER — Other Ambulatory Visit (HOSPITAL_COMMUNITY): Payer: Self-pay

## 2024-01-31 ENCOUNTER — Encounter (HOSPITAL_COMMUNITY): Payer: Self-pay | Admitting: Orthopedic Surgery

## 2024-01-31 DIAGNOSIS — Z96651 Presence of right artificial knee joint: Secondary | ICD-10-CM | POA: Diagnosis not present

## 2024-01-31 DIAGNOSIS — Z91040 Latex allergy status: Secondary | ICD-10-CM | POA: Diagnosis not present

## 2024-01-31 DIAGNOSIS — I129 Hypertensive chronic kidney disease with stage 1 through stage 4 chronic kidney disease, or unspecified chronic kidney disease: Secondary | ICD-10-CM | POA: Diagnosis not present

## 2024-01-31 DIAGNOSIS — Z7982 Long term (current) use of aspirin: Secondary | ICD-10-CM | POA: Diagnosis not present

## 2024-01-31 DIAGNOSIS — N189 Chronic kidney disease, unspecified: Secondary | ICD-10-CM | POA: Diagnosis not present

## 2024-01-31 DIAGNOSIS — M1711 Unilateral primary osteoarthritis, right knee: Secondary | ICD-10-CM | POA: Diagnosis not present

## 2024-01-31 DIAGNOSIS — Z79899 Other long term (current) drug therapy: Secondary | ICD-10-CM | POA: Diagnosis not present

## 2024-01-31 LAB — BASIC METABOLIC PANEL WITH GFR
Anion gap: 8 (ref 5–15)
BUN: 9 mg/dL (ref 8–23)
CO2: 23 mmol/L (ref 22–32)
Calcium: 8.7 mg/dL — ABNORMAL LOW (ref 8.9–10.3)
Chloride: 107 mmol/L (ref 98–111)
Creatinine, Ser: 0.63 mg/dL (ref 0.44–1.00)
GFR, Estimated: >60 mL/min (ref >60)
Glucose, Bld: 144 mg/dL — ABNORMAL HIGH (ref 70–99)
Potassium: 4 mmol/L (ref 3.5–5.1)
Sodium: 138 mmol/L (ref 135–145)

## 2024-01-31 LAB — CBC
HCT: 30.8 % — ABNORMAL LOW (ref 36.0–46.0)
Hemoglobin: 9.1 g/dL — ABNORMAL LOW (ref 12.0–15.0)
MCH: 25.6 pg — ABNORMAL LOW (ref 26.0–34.0)
MCHC: 29.5 g/dL — ABNORMAL LOW (ref 30.0–36.0)
MCV: 86.5 fL (ref 80.0–100.0)
Platelets: 309 K/uL (ref 150–400)
RBC: 3.56 MIL/uL — ABNORMAL LOW (ref 3.87–5.11)
RDW: 13.9 % (ref 11.5–15.5)
WBC: 12.5 K/uL — ABNORMAL HIGH (ref 4.0–10.5)
nRBC: 0 % (ref 0.0–0.2)

## 2024-01-31 MED ORDER — POLYETHYLENE GLYCOL 3350 17 GM/SCOOP PO POWD
17.0000 g | Freq: Two times a day (BID) | ORAL | 0 refills | Status: DC
  Filled 2024-01-31: qty 238, 7d supply, fill #0

## 2024-01-31 MED ORDER — ASPIRIN 81 MG PO CHEW: 81.0000 mg | CHEWABLE_TABLET | Freq: Two times a day (BID) | ORAL | Status: AC

## 2024-01-31 MED ORDER — METHOCARBAMOL 500 MG PO TABS
500.0000 mg | ORAL_TABLET | Freq: Four times a day (QID) | ORAL | 0 refills | Status: DC | PRN
  Filled 2024-01-31: qty 40, 10d supply, fill #0

## 2024-01-31 MED ORDER — HYDROCODONE-ACETAMINOPHEN 5-325 MG PO TABS
1.0000 | ORAL_TABLET | ORAL | 0 refills | Status: DC | PRN
  Filled 2024-01-31: qty 42, 7d supply, fill #0

## 2024-01-31 MED ORDER — SENNA 8.6 MG PO TABS
2.0000 | ORAL_TABLET | Freq: Every day | ORAL | 0 refills | Status: AC
  Filled 2024-01-31: qty 28, 14d supply, fill #0

## 2024-01-31 MED ORDER — CELECOXIB 200 MG PO CAPS
200.0000 mg | ORAL_CAPSULE | Freq: Two times a day (BID) | ORAL | 0 refills | Status: AC
  Filled 2024-01-31: qty 60, 30d supply, fill #0

## 2024-01-31 NOTE — Progress Notes (Signed)
   Subjective: 1 Day Post-Op Procedure(s) (LRB): ARTHROPLASTY, KNEE, TOTAL (Right) Patient reports pain as mild.   Patient seen in rounds with Dr. Ernie. Patient is well, and has had no acute complaints or problems. No acute events overnight. Foley catheter removed. Patient ambulated a few feet with PT.  We will start therapy today.   Objective: Vital signs in last 24 hours: Temp:  [97.4 F (36.3 C)-97.8 F (36.6 C)] 97.8 F (36.6 C) (10/29 0604) Pulse Rate:  [47-80] 48 (10/29 0604) Resp:  [11-18] 18 (10/29 0604) BP: (105-171)/(57-75) 131/63 (10/29 0604) SpO2:  [93 %-100 %] 97 % (10/29 0604) Weight:  [59.4 kg] 59.4 kg (10/28 1105)  Intake/Output from previous day:  Intake/Output Summary (Last 24 hours) at 01/31/2024 0748 Last data filed at 01/31/2024 0400 Gross per 24 hour  Intake 3087.08 ml  Output 1500 ml  Net 1587.08 ml     Intake/Output this shift: No intake/output data recorded.  Labs: Recent Labs    01/31/24 0336  HGB 9.1*   Recent Labs    01/31/24 0336  WBC 12.5*  RBC 3.56*  HCT 30.8*  PLT 309   Recent Labs    01/31/24 0336  NA 138  K 4.0  CL 107  CO2 23  BUN 9  CREATININE 0.63  GLUCOSE 144*  CALCIUM  8.7*   No results for input(s): LABPT, INR in the last 72 hours.  Exam: General - Patient is Alert and Oriented Extremity - Neurologically intact Sensation intact distally Intact pulses distally Dorsiflexion/Plantar flexion intact Dressing - dressing C/D/I Motor Function - intact, moving foot and toes well on exam.   Past Medical History:  Diagnosis Date   Acid reflux    Acute lateral meniscus tear of right knee    Acute medial meniscus tear of right knee    Anxiety    Arthritis    Blood in stool    Chronic kidney disease    GERD (gastroesophageal reflux disease)    History of kidney stones    Hypercholesteremia    Hyperlipidemia    Hypertension     Assessment/Plan: 1 Day Post-Op Procedure(s) (LRB): ARTHROPLASTY, KNEE,  TOTAL (Right) Principal Problem:   S/P total knee arthroplasty, right  Estimated body mass index is 24.75 kg/m as calculated from the following:   Height as of this encounter: 5' 1 (1.549 m).   Weight as of this encounter: 59.4 kg. Advance diet Up with therapy D/C IV fluids   Patient's anticipated LOS is less than 2 midnights, meeting these requirements: - Younger than 66 - Lives within 1 hour of care - Has a competent adult at home to recover with post-op recover - NO history of  - Chronic pain requiring opiods  - Diabetes  - Coronary Artery Disease  - Heart failure  - Heart attack  - Stroke  - DVT/VTE  - Cardiac arrhythmia  - Respiratory Failure/COPD  - Renal failure  - Anemia  - Advanced Liver disease     DVT Prophylaxis - Aspirin (enteric coated is okay which is what she has at home) Weight bearing as tolerated.  Hgb stable at 9.1 this AM  Plan is to go Home after hospital stay. Plan for discharge today following 1-2 sessions of PT as long as they are meeting their goals. Patient is scheduled for OPPT. Follow up in the office in 2 weeks.   Rosina Calin, PA-C Orthopedic Surgery [phone] 01/31/2024, 7:48 AM

## 2024-01-31 NOTE — TOC Transition Note (Signed)
 Transition of Care [facility]) - Discharge Note   Patient Details  Name: Alice Ryan MRN: [id number] Date of Birth: 08-Oct-1960  [facility] of [facility]) CM/SW Contact:  Alfonse JONELLE Rex, RN Phone Number: 01/31/2024, 10:52 AM   Clinical Narrative:   Met with patient at bedside to review dc therapy and home equipment needs, patient confirmed OPPT-SOS, RW delivered to bedside by [employer]. No TOC needs     Final next level of care: OP Rehab Barriers to Discharge: No Barriers Identified   Patient Goals and CMS Choice Patient states their goals for this hospitalization and ongoing recovery are:: return home          Discharge Placement                       Discharge Plan and Services Additional resources added to the After Visit Summary for                                       Social Drivers of Health (SDOH) Interventions SDOH Screenings   Food Insecurity: No Food Insecurity (01/30/2024)  Housing: Low Risk  (01/30/2024)  Transportation Needs: No Transportation Needs (01/30/2024)  Utilities: Not At Risk (01/30/2024)  Depression (PHQ2-9): Low Risk  (12/21/2022)  Tobacco Use: Low Risk  (01/30/2024)     Readmission Risk Interventions     No data to display

## 2024-01-31 NOTE — Plan of Care (Signed)
  Problem: Education: Goal: Knowledge of General Education information will improve Description: Including pain rating scale, medication(s)/side effects and non-pharmacologic comfort measures 01/31/2024 0734 by Durenda Lieu, RN Outcome: Progressing 01/31/2024 0732 by Durenda Lieu, RN Outcome: Progressing   Problem: Health Behavior/Discharge Planning: Goal: Ability to manage health-related needs will improve 01/31/2024 0734 by Durenda Lieu, RN Outcome: Progressing 01/31/2024 0732 by Durenda Lieu, RN Outcome: Progressing   Problem: Clinical Measurements: Goal: Ability to maintain clinical measurements within normal limits will improve 01/31/2024 0734 by Durenda Lieu, RN Outcome: Progressing 01/31/2024 0732 by Durenda Lieu, RN Outcome: Progressing Goal: Will remain free from infection 01/31/2024 0734 by Durenda Lieu, RN Outcome: Progressing 01/31/2024 0732 by Durenda Lieu, RN Outcome: Progressing Goal: Diagnostic test results will improve 01/31/2024 0734 by Durenda Lieu, RN Outcome: Progressing 01/31/2024 0732 by Durenda Lieu, RN Outcome: Progressing Goal: Respiratory complications will improve 01/31/2024 0734 by Durenda Lieu, RN Outcome: Progressing 01/31/2024 0732 by Durenda Lieu, RN Outcome: Progressing Goal: Cardiovascular complication will be avoided 01/31/2024 0734 by Durenda Lieu, RN Outcome: Progressing 01/31/2024 0732 by Durenda Lieu, RN Outcome: Progressing   Problem: Activity: Goal: Risk for activity intolerance will decrease 01/31/2024 0734 by Durenda Lieu, RN Outcome: Progressing 01/31/2024 0732 by Durenda Lieu, RN Outcome: Progressing   Problem: Nutrition: Goal: Adequate nutrition will be maintained Outcome: Progressing

## 2024-01-31 NOTE — Progress Notes (Signed)
 Discharge medications delivered to patient at the bedside in a secure bag.

## 2024-01-31 NOTE — Progress Notes (Signed)
 Physical Therapy Treatment Patient Details Name: Alice Ryan MRN: [id number] DOB: [birth year] Today's Date: 01/31/2024   History of Present Illness 63 yo female s/p R TKA on 01/30/24. PMH: HTN, GAD, MDD, meniscus tear bil knees, CKD, hidradenitis suppurativa.    PT Comments  Pt is continues to progress well. Meeting goals. Ongoing education regarding transfer safety and gait. Reviewed stairs, pt tol without incr pain.  Pt is ready to d/c home from PT standpoint with family assisting as needed    If plan is discharge home, recommend the following: A little help with bathing/dressing/bathroom;Help with stairs or ramp for entrance;Assist for transportation   Can travel by private vehicle        Equipment Recommendations  Rolling walker (2 wheels) (delivered-PT adjusted)    Recommendations for Other Services       Precautions / Restrictions Precautions Precautions: Fall;Knee Recall of Precautions/Restrictions: Intact Restrictions Weight Bearing Restrictions Per Provider Order: No Other Position/Activity Restrictions: WBAT     Mobility  Bed Mobility Overal bed mobility: Needs Assistance Bed Mobility: Supine to Sit, Sit to Supine     Supine to sit: Supervision, Modified independent (Device/Increase time)     General bed mobility comments: no physical assist, pt able to progress RLE off and on to bed wtih incr time    Transfers Overall transfer level: Needs assistance Equipment used: Rolling walker (2 wheels) Transfers: Sit to/from Stand Sit to Stand: Contact guard assist, Supervision           General transfer comment: ongoing education for hand placement and LE position.    Ambulation/Gait Ambulation/Gait assistance: Contact guard assist, Supervision Gait Distance (Feet): 100 Feet Assistive device: Rolling walker (2 wheels) Gait Pattern/deviations: Step-to pattern, Step-through pattern Gait velocity: decr but functional     General Gait Details:  cues for sequence, RW position. no knee buckling   Stairs Stairs: Yes Stairs assistance: Contact guard assist Stair Management: Two rails, Step to pattern, Forwards Number of Stairs: 2 (up 2/down 3) General stair comments: cues for sequence and technique. good stability, no knee buckling   Wheelchair Mobility     Tilt Bed    Modified Rankin (Stroke Patients Only)       Balance           Standing balance support: During functional activity, Reliant on assistive device for balance Standing balance-Leahy Scale: Fair                              Hotel Manager: No apparent difficulties  Cognition Arousal: Alert Behavior During Therapy: WFL for tasks assessed/performed   PT - Cognitive impairments: No apparent impairments                         Following commands: Intact      Cueing Cueing Techniques: Verbal cues  Exercises   General Comments        Pertinent Vitals/Pain Pain Assessment Pain Assessment: Faces Faces Pain Scale: Hurts a little bit Pain Location: right knee Pain Descriptors / Indicators: Sore, Discomfort Pain Intervention(s): Limited activity within patient's tolerance, Monitored during session, Premedicated before session, Repositioned    Home Living                          Prior Function            PT Goals (current goals can now be  found in the care plan section) Acute Rehab PT Goals PT Goal Formulation: With patient Time For Goal Achievement: 02/06/24 Potential to Achieve Goals: Good Progress towards PT goals: Progressing toward goals    Frequency    7X/week      PT Plan      Co-evaluation              AM-PAC PT 6 Clicks Mobility   Outcome Measure  Help needed turning from your back to your side while in a flat bed without using bedrails?: None Help needed moving from lying on your back to sitting on the side of a flat bed without using bedrails?:  None Help needed moving to and from a bed to a chair (including a wheelchair)?: A Little Help needed standing up from a chair using your arms (e.g., wheelchair or bedside chair)?: A Little Help needed to walk in hospital room?: A Little Help needed climbing 3-5 steps with a railing? : A Little 6 Click Score: 20    End of Session Equipment Utilized During Treatment: Gait belt Activity Tolerance: Patient tolerated treatment well Patient left: in bed;with call bell/phone within reach;with bed alarm set Nurse Communication: Mobility status PT Visit Diagnosis: Other abnormalities of gait and mobility (R26.89)     Time: 8767-8749 PT Time Calculation (min) (ACUTE ONLY): 18 min  Charges:    $Gait Training: 8-22 mins $Therapeutic Exercise: 8-22 mins PT General Charges $$ ACUTE PT VISIT: 1 Visit                     Rexene, PT  Acute Rehab Dept ([facility]) [phone]  01/31/2024    [facility] 01/31/2024, 1:28 PM

## 2024-01-31 NOTE — Progress Notes (Signed)
 Physical Therapy Treatment Patient Details Name: Alice Ryan MRN: [id number] DOB: Sep 25, 1960 Today's Date: 01/31/2024   History of Present Illness 63 yo female s/p R TKA on 01/30/24. PMH: HTN, GAD, MDD, meniscus tear bil knees, CKD, hidradenitis suppurativa.    PT Comments  Pt progressing toward goals this session. Incr gait distance ~ 30' however had a couple episodes of R knee buckling. Reviewed TKA HEP. Pt tol without incr pain. Will see again in pm and pt should be ready to d/c later today.    If plan is discharge home, recommend the following: A little help with bathing/dressing/bathroom;Help with stairs or ramp for entrance;Assist for transportation   Can travel by private vehicle        Equipment Recommendations  Rolling walker (2 wheels) (delivered-PT adjusted)    Recommendations for Other Services       Precautions / Restrictions Precautions Precautions: Fall;Knee Recall of Precautions/Restrictions: Intact Restrictions Weight Bearing Restrictions Per Provider Order: No Other Position/Activity Restrictions: WBAT     Mobility  Bed Mobility Overal bed mobility: Needs Assistance Bed Mobility: Supine to Sit, Sit to Supine     Supine to sit: Supervision     General bed mobility comments: no physical assist, pt able to progress RLE off and on to bed wtih incr time    Transfers Overall transfer level: Needs assistance Equipment used: Rolling walker (2 wheels) Transfers: Sit to/from Stand Sit to Stand: Contact guard assist           General transfer comment: cues for hand placement and LE position.    Ambulation/Gait Ambulation/Gait assistance: Contact guard assist, Min assist Gait Distance (Feet): 65 Feet Assistive device: Rolling walker (2 wheels) Gait Pattern/deviations: Step-to pattern, Step-through pattern Gait velocity: decr but functional     General Gait Details: cues for sequence, RW position, and to decr step length d/t couple  episodes of slight R knee buckling-pt able to self correct/recover   Stairs             Wheelchair Mobility     Tilt Bed    Modified Rankin (Stroke Patients Only)       Balance           Standing balance support: During functional activity, Reliant on assistive device for balance Standing balance-Leahy Scale: Poor                              Communication Communication Communication: No apparent difficulties  Cognition Arousal: Alert Behavior During Therapy: WFL for tasks assessed/performed   PT - Cognitive impairments: No apparent impairments                         Following commands: Intact      Cueing Cueing Techniques: Verbal cues  Exercises Total Joint Exercises Ankle Circles/Pumps: AAROM, Both, 10 reps Quad Sets: AROM, Both, 10 reps Heel Slides: AAROM, Right, 10 reps Straight Leg Raises: AROM, Strengthening, Right, 10 reps    General Comments        Pertinent Vitals/Pain Pain Assessment Pain Assessment: Faces Faces Pain Scale: Hurts a little bit Pain Location: right knee Pain Descriptors / Indicators: Sore, Discomfort Pain Intervention(s): Limited activity within patient's tolerance, Monitored during session, Premedicated before session, Repositioned, Ice applied    Home Living  Prior Function            PT Goals (current goals can now be found in the care plan section) Acute Rehab PT Goals PT Goal Formulation: With patient Time For Goal Achievement: 02/06/24 Potential to Achieve Goals: Good Progress towards PT goals: Progressing toward goals    Frequency    7X/week      PT Plan      Co-evaluation              AM-PAC PT 6 Clicks Mobility   Outcome Measure  Help needed turning from your back to your side while in a flat bed without using bedrails?: None Help needed moving from lying on your back to sitting on the side of a flat bed without using  bedrails?: None Help needed moving to and from a bed to a chair (including a wheelchair)?: A Little Help needed standing up from a chair using your arms (e.g., wheelchair or bedside chair)?: A Little Help needed to walk in hospital room?: A Little Help needed climbing 3-5 steps with a railing? : A Little 6 Click Score: 20    End of Session Equipment Utilized During Treatment: Gait belt Activity Tolerance: Patient tolerated treatment well Patient left: in bed;with call bell/phone within reach;with bed alarm set Nurse Communication: Mobility status PT Visit Diagnosis: Other abnormalities of gait and mobility (R26.89)     Time: 9055-8989 PT Time Calculation (min) (ACUTE ONLY): 26 min  Charges:    $Gait Training: 8-22 mins $Therapeutic Exercise: 8-22 mins PT General Charges $$ ACUTE PT VISIT: 1 Visit                     Rexene, PT  Acute Rehab Dept [facility]) [phone]  01/31/2024    [facility] 01/31/2024, 10:15 AM

## 2024-01-31 NOTE — Plan of Care (Signed)

## 2024-02-05 ENCOUNTER — Ambulatory Visit: Admitting: Psychology

## 2024-02-05 DIAGNOSIS — F330 Major depressive disorder, recurrent, mild: Secondary | ICD-10-CM

## 2024-02-05 DIAGNOSIS — F411 Generalized anxiety disorder: Secondary | ICD-10-CM
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
## 2024-02-15 NOTE — Discharge Summary (Signed)
 Patient ID: Alice Ryan MRN: [id number] DOB/AGE: December 11, 1960 63 y.o.  Admit date: 01/30/2024 Discharge date: 01/31/2024  Admission Diagnoses:  Right knee osteoarthritis  Discharge Diagnoses:  Principal Problem:   S/P total knee arthroplasty, right   Past Medical History:  Diagnosis Date   Acid reflux    Acute lateral meniscus tear of right knee    Acute medial meniscus tear of right knee    Anxiety    Arthritis    Blood in stool    Chronic kidney disease    GERD (gastroesophageal reflux disease)    History of kidney stones    Hypercholesteremia    Hyperlipidemia    Hypertension     Surgeries: Procedure(s): ARTHROPLASTY, KNEE, TOTAL on 01/30/2024   Consultants:   Discharged Condition: Improved  Hospital Course: Alice Ryan is an 63 y.o. female who was admitted 01/30/2024 for operative treatment ofS/P total knee arthroplasty, right. Patient has severe unremitting pain that affects sleep, daily activities, and work/hobbies. After pre-op clearance the patient was taken to the operating room on 01/30/2024 and underwent  Procedure(s): ARTHROPLASTY, KNEE, TOTAL.    Patient was given perioperative antibiotics:  Anti-infectives (From admission, onward)    Start     Dose/Rate Route Frequency Ordered Stop   01/30/24 1830  ceFAZolin  (ANCEF ) IVPB 2g/100 mL premix        2 g 200 mL/hr over 30 Minutes Intravenous Every 6 hours 01/30/24 1557 01/31/24 0700   01/30/24 1100  ceFAZolin  (ANCEF ) IVPB 2g/100 mL premix        2 g 200 mL/hr over 30 Minutes Intravenous On call to O.R. 01/30/24 1058 01/30/24 1307        Patient was given sequential compression devices, early ambulation, and chemoprophylaxis to prevent DVT. Patient worked with PT and was meeting their goals regarding safe ambulation and transfers.  Patient benefited maximally from hospital stay and there were no complications.    Recent vital signs: No data found.   Recent laboratory studies: No  results for input(s): WBC, HGB, HCT, PLT, NA, K, CL, CO2, BUN, CREATININE, GLUCOSE, INR, CALCIUM  in the last 72 hours.  Invalid input(s): PT, 2   Discharge Medications:   Allergies as of 01/31/2024       Reactions   Apple Juice Anaphylaxis   NO RAW APPLES   Latex Anaphylaxis   Latex condom   Chlorhexidine  Gluconate Itching   Flounder [fish Allergy] Swelling   Other Swelling   Oxycodone-acetaminophen  Other (See Comments)   knocked me out Other reaction(s): Other (See Comments) knocked me out   Peanut Butter Flavoring Agent (non-screening) Itching        Medication List     TAKE these medications    ALPRAZolam  0.5 MG tablet Commonly known as: XANAX  Take 1/2-1 tablet (0.25-0.5 mg total) by mouth 2 (two) times daily as needed for anxiety.   amLODipine -olmesartan  5-20 MG tablet Commonly known as: AZOR  Take 1 tablet by mouth daily.   aspirin 81 MG chewable tablet Chew 1 tablet (81 mg total) by mouth 2 (two) times daily for 28 days.   atorvastatin  40 MG tablet Commonly known as: LIPITOR Take 1 tablet (40 mg total) by mouth daily.   celecoxib  200 MG capsule Commonly known as: CeleBREX  Take 1 capsule (200 mg total) by mouth 2 (two) times daily.   clindamycin  1 % gel Commonly known as: CLINDAGEL  Apply 1 Application topically 2 (two) times daily for 15 days.   EPINEPHrine  0.3 mg/0.3 mL Soaj injection Commonly  known as: EpiPen  2-Pak Inject 0.3 mg into the muscle as needed for anaphylaxis.   escitalopram  20 MG tablet Commonly known as: Lexapro  Take 1 tablet (20 mg total) by mouth daily. What changed: how much to take   fluticasone  50 MCG/ACT nasal spray Commonly known as: FLONASE  Place 1 spray into both nostrils daily as needed for allergies.   HYDROcodone -acetaminophen  5-325 MG tablet Commonly known as: NORCO/VICODIN Take 1 tablet by mouth every 4 (four) hours as needed for severe pain (pain score 7-10).   methocarbamol 500  MG tablet Commonly known as: ROBAXIN Take 1 tablet (500 mg total) by mouth every 6 (six) hours as needed for muscle spasms.   polyethylene glycol powder 17 GM/SCOOP powder Commonly known as: GLYCOLAX/MIRALAX Take 17 g by mouth 2 (two) times daily. Dissolve 1 capful (17g) in 4-8 ounces of liquid and take by mouth daily.   Voltaren  1 % Gel Generic drug: diclofenac  Sodium Apply 1 Application topically daily as needed (pain.).       ASK your doctor about these medications    senna 8.6 MG Tabs tablet Commonly known as: SENOKOT Take 2 tablets (17.2 mg total) by mouth at bedtime for 14 days. Ask about: Should I take this medication?               Discharge Care Instructions  (From admission, onward)           Start     Ordered   01/31/24 0000  Change dressing       Comments: Maintain surgical dressing until follow up in the clinic. If the edges start to pull up, may reinforce with tape. If the dressing is no longer working, may remove and cover with gauze and tape, but must keep the area dry and clean.  Call with any questions or concerns.   01/31/24 0750            Diagnostic Studies: No results found.  Disposition: Discharge disposition: 01-Home or Self Care       Discharge Instructions     Call MD / Call 911   Complete by: As directed    If you experience chest pain or shortness of breath, CALL 911 and be transported to the hospital emergency room.  If you develope a fever above 101 F, pus (white drainage) or increased drainage or redness at the wound, or calf pain, call your surgeon's office.   Change dressing   Complete by: As directed    Maintain surgical dressing until follow up in the clinic. If the edges start to pull up, may reinforce with tape. If the dressing is no longer working, may remove and cover with gauze and tape, but must keep the area dry and clean.  Call with any questions or concerns.   Constipation Prevention   Complete by: As  directed    Drink plenty of fluids.  Prune juice may be helpful.  You may use a stool softener, such as Colace (over the counter) 100 mg twice a day.  Use MiraLax (over the counter) for constipation as needed.   Diet - low sodium heart healthy   Complete by: As directed    Increase activity slowly as tolerated   Complete by: As directed    Weight bearing as tolerated with assist device (walker, cane, etc) as directed, use it as long as suggested by your surgeon or therapist, typically at least 4-6 weeks.   Post-operative opioid taper instructions:   Complete by: As directed  POST-OPERATIVE OPIOID TAPER INSTRUCTIONS: It is important to wean off of your opioid medication as soon as possible. If you do not need pain medication after your surgery it is ok to stop day one. Opioids include: Codeine, Hydrocodone (Norco, Vicodin), Oxycodone(Percocet, oxycontin) and hydromorphone  amongst others.  Long term and even short term use of opiods can cause: Increased pain response Dependence Constipation Depression Respiratory depression And more.  Withdrawal symptoms can include Flu like symptoms Nausea, vomiting And more Techniques to manage these symptoms Hydrate well Eat regular healthy meals Stay active Use relaxation techniques(deep breathing, meditating, yoga) Do Not substitute Alcohol to help with tapering If you have been on opioids for less than two weeks and do not have pain than it is ok to stop all together.  Plan to wean off of opioids This plan should start within one week post op of your joint replacement. Maintain the same interval or time between taking each dose and first decrease the dose.  Cut the total daily intake of opioids by one tablet each day Next start to increase the time between doses. The last dose that should be eliminated is the evening dose.      TED hose   Complete by: As directed    Use stockings (TED hose) for 2 weeks on both leg(s).  You may remove  them at night for sleeping.        Follow-up Information     Ernie Cough, MD. Schedule an appointment as soon as possible for a visit in 2 week(s).   Specialty: Orthopedic Surgery Contact information: [address] [place] [address] [phone]                  Signed: Rosina JONELLE Calin 02/15/2024, 6:47 AM

## 2024-02-16 DIAGNOSIS — M25561 Pain in right knee: Secondary | ICD-10-CM | POA: Diagnosis not present

## 2024-02-16 DIAGNOSIS — M25661 Stiffness of right knee, not elsewhere classified: Secondary | ICD-10-CM | POA: Diagnosis not present

## 2024-02-16 DIAGNOSIS — R269 Unspecified abnormalities of gait and mobility: Secondary | ICD-10-CM | POA: Diagnosis not present

## 2024-02-16 DIAGNOSIS — R29898 Other symptoms and signs involving the musculoskeletal system: Secondary | ICD-10-CM | POA: Diagnosis not present

## 2024-02-19 DIAGNOSIS — M25661 Stiffness of right knee, not elsewhere classified: Secondary | ICD-10-CM | POA: Diagnosis not present

## 2024-02-19 DIAGNOSIS — M25561 Pain in right knee: Secondary | ICD-10-CM | POA: Diagnosis not present

## 2024-02-19 DIAGNOSIS — R269 Unspecified abnormalities of gait and mobility: Secondary | ICD-10-CM | POA: Diagnosis not present

## 2024-02-19 DIAGNOSIS — R29898 Other symptoms and signs involving the musculoskeletal system: Secondary | ICD-10-CM | POA: Diagnosis not present

## 2024-02-23 ENCOUNTER — Other Ambulatory Visit (HOSPITAL_COMMUNITY): Payer: Self-pay

## 2024-02-23 ENCOUNTER — Other Ambulatory Visit: Payer: Self-pay | Admitting: Student

## 2024-02-23 DIAGNOSIS — M25561 Pain in right knee: Secondary | ICD-10-CM | POA: Diagnosis not present

## 2024-02-23 DIAGNOSIS — R29898 Other symptoms and signs involving the musculoskeletal system: Secondary | ICD-10-CM | POA: Diagnosis not present

## 2024-02-23 DIAGNOSIS — M25661 Stiffness of right knee, not elsewhere classified: Secondary | ICD-10-CM | POA: Diagnosis not present

## 2024-02-23 DIAGNOSIS — R269 Unspecified abnormalities of gait and mobility: Secondary | ICD-10-CM | POA: Diagnosis not present

## 2024-02-27 DIAGNOSIS — M25661 Stiffness of right knee, not elsewhere classified: Secondary | ICD-10-CM | POA: Diagnosis not present

## 2024-02-27 DIAGNOSIS — R269 Unspecified abnormalities of gait and mobility: Secondary | ICD-10-CM | POA: Diagnosis not present

## 2024-02-27 DIAGNOSIS — M25561 Pain in right knee: Secondary | ICD-10-CM | POA: Diagnosis not present

## 2024-02-27 DIAGNOSIS — R29898 Other symptoms and signs involving the musculoskeletal system: Secondary | ICD-10-CM | POA: Diagnosis not present

## 2024-03-04 ENCOUNTER — Ambulatory Visit: Admitting: Psychology

## 2024-03-04 ENCOUNTER — Ambulatory Visit: Payer: Self-pay

## 2024-03-04 DIAGNOSIS — R269 Unspecified abnormalities of gait and mobility: Secondary | ICD-10-CM | POA: Diagnosis not present

## 2024-03-04 DIAGNOSIS — R29898 Other symptoms and signs involving the musculoskeletal system: Secondary | ICD-10-CM | POA: Diagnosis not present

## 2024-03-04 DIAGNOSIS — M25661 Stiffness of right knee, not elsewhere classified: Secondary | ICD-10-CM | POA: Diagnosis not present

## 2024-03-04 DIAGNOSIS — M25561 Pain in right knee: Secondary | ICD-10-CM | POA: Diagnosis not present

## 2024-03-04 NOTE — Telephone Encounter (Signed)
  FYI Only or Action Required?: FYI only for provider: appointment scheduled on 12.2.25.  Patient was last seen in primary care on 11/01/2023 by Jordan, [name], MD.  Called Nurse Triage reporting Allergic Reaction.  Symptoms began a week ago.  Interventions attempted: OTC medications: benadryl  and benadryl  cream.  Symptoms are: gradually improving.  Triage Disposition: See PCP When Office is Open (Within 3 Days)  Patient/caregiver understands and will follow disposition?: Yes     Reason for Disposition  Hives persist > 1 week  Answer Assessment - Initial Assessment Questions Pt states a couple of days prior to thanksgiving she noticed these welts on her shoulder blades and then they moved to her arms. She states also her scalp has been itchy but she thinks that might be from using an old perm solution. She states she does have some new medications that have been added and a new tide detergent. She states when she takes benadryl  the welts go away. They also appeared as tiny bumps on her face by her temple and under her left chin. Those have dissipated as well. She denies any chest pain, shortness of breath, tongue or throat swelling. Pt does have lots of allergies and has an epi pen. RN gave instructions on when to go to the ER. Pt stated understanding. Pt was scheduled prior to talking to triage RN.     1. APPEARANCE: What does the rash look like?      Red welts, or small red bumps 2. LOCATION: Where is the rash located?      Started on shoulder, then moved to arms. Those are gone, then under left chin and temple. Those are also gone.  3. NUMBER: How many hives are there?      3-4 on face, multiple on arm.   5. ONSET: When did the hives begin? (e.g., hours or days ago)      Prior to Thanksgiving.  6. ITCHING: Does it itch? If Yes, ask: How bad is the itch?  (e.g., none, mild, moderate, severe)     yes 7. RECURRENT PROBLEM: Have you had hives before? If Yes, ask:  When was the last time? and What happened that time?      yes 8. TRIGGERS: Were you exposed to any new food, plant, cosmetic product or animal just before the hives began?     Unknown- meds, detergent both changed 9. OTHER SYMPTOMS: Do you have any other symptoms? (e.g., fever, tongue swelling, difficulty breathing, abdomen pain)     denies  Protocols used: Hives-A-AH

## 2024-03-04 NOTE — Telephone Encounter (Signed)
 Attempt #1 to reach patient to triage. Left VM to call back to the office.   Message from [facility] sent at 03/04/2024  1:55 PM EST  Reason for Triage: Patient complained of having whelps on left of her face, under chin and on right arm. Had a knee replacement October 28th and not sure if it was from  the meds. She wanted an appt today however, advised no appts today. She did want the earliest appt tomorrow but still wanted someone to call today to see what could be done.    Alice Ryan- [phone]

## 2024-03-05 ENCOUNTER — Ambulatory Visit: Admitting: Family Medicine

## 2024-03-06 ENCOUNTER — Other Ambulatory Visit (HOSPITAL_COMMUNITY): Payer: Self-pay

## 2024-03-06 ENCOUNTER — Other Ambulatory Visit: Payer: Self-pay

## 2024-03-06 ENCOUNTER — Ambulatory Visit: Admitting: Family Medicine

## 2024-03-06 ENCOUNTER — Encounter: Payer: Self-pay | Admitting: Family Medicine

## 2024-03-06 VITALS — BP 148/80 | HR 67 | Temp 97.9°F | Resp 16 | Ht 61.0 in | Wt 136.4 lb

## 2024-03-06 DIAGNOSIS — F331 Major depressive disorder, recurrent, moderate: Secondary | ICD-10-CM | POA: Diagnosis not present

## 2024-03-06 DIAGNOSIS — I10 Essential (primary) hypertension: Secondary | ICD-10-CM | POA: Diagnosis not present

## 2024-03-06 DIAGNOSIS — F411 Generalized anxiety disorder: Secondary | ICD-10-CM

## 2024-03-06 DIAGNOSIS — L509 Urticaria, unspecified: Secondary | ICD-10-CM | POA: Diagnosis not present

## 2024-03-06 DIAGNOSIS — L732 Hidradenitis suppurativa: Secondary | ICD-10-CM | POA: Diagnosis not present

## 2024-03-06 MED ORDER — AMLODIPINE-OLMESARTAN 10-20 MG PO TABS
1.0000 | ORAL_TABLET | Freq: Every day | ORAL | 1 refills | Status: AC
  Filled 2024-03-06: qty 30, 30d supply, fill #0

## 2024-03-06 MED ORDER — PREDNISONE 20 MG PO TABS
40.0000 mg | ORAL_TABLET | Freq: Every day | ORAL | 0 refills | Status: AC
  Filled 2024-03-06: qty 10, 5d supply, fill #0

## 2024-03-06 NOTE — Patient Instructions (Addendum)
 A few things to remember from today's visit:  Hidradenitis suppurativa - Plan: Ambulatory referral to Dermatology  Urticaria - Plan: predniSONE  (DELTASONE ) 20 MG tablet  Essential hypertension - Plan: amlodipine -olmesartan  (AZOR ) 10-20 MG tablet  Depression, major, recurrent, moderate (HCC) - Plan: Ambulatory referral to Psychiatry  GAD (generalized anxiety disorder) - Plan: Ambulatory referral to Psychiatry Amlodipine  dose increased from 5 mg to 10 mg. Dermatology referral placed for hydradenitis. Zyrtec 10 mg daily. Prednisone  with breakfast for 5 days.  If you need refills for medications you take chronically, please call your pharmacy. Do not use My Chart to request refills or for acute issues that need immediate attention. If you send a my chart message, it may take a few days to be addressed, specially if I am not in the office.  Please be sure medication list is accurate. If a new problem present, please set up appointment sooner than planned today.

## 2024-03-06 NOTE — Assessment & Plan Note (Signed)
 Problem is not well controlled, reporting elevated BP's at home. Currently she is on Amlodipine -Olmesartan  5-20 mg daily, recommend increasing dose of Amlodipine  to 10 mg. Continue low salt diet. Continue monitoring BP at home. F/U in 3 months.

## 2024-03-06 NOTE — Assessment & Plan Note (Signed)
 Recently underwent surgical treatment, did not help significantly. She follows with surgeon, topical treatments have not been effective. Referral to dermatologist placed.

## 2024-03-06 NOTE — Assessment & Plan Note (Addendum)
 Problem is stable but not well controlled. Currently she is on Lexapro  20 mg. Exacerbated by recent health issues and the death of her father in July 10, 2023. She agrees with referral to psychiatrist. Continue CBT.

## 2024-03-06 NOTE — Progress Notes (Signed)
 ACUTE VISIT Chief Complaint  Patient presents with   Acute Visit    Welts on chin    Discussed the use of AI scribe software for clinical note transcription with the patient, who gave verbal consent to proceed. History of Present Illness Alice Ryan is a 63 year old female with PMHx significant for HTN,HLD, anxiety, depression,and allergies who presents with pruritic rash.  She has developed a rash described as 'little webs' and bumps primarily on her chin, arms, legs, hands, and occasionally her face. The rash has been present for about a week and a half and is associated with pruritus. It is intermittent. She has not identified exacerbating factors.  She suspects the rash may be related to a change in laundry detergent to OxiClean or possibly a reaction to medications post-surgery. She has a known history of allergies and carries an EpiPen  medications with her. She has been using Benadryl  tablets and hydrocortisone cream to manage the symptoms, but the condition remains unchanged. No associated facial edema.  She has seen immunologist years ago and was on immunotherapy. Fhx negative for similar problem.  She has a history of being highly allergic to various substances, including certain foods like cookies containing wheat or tree nuts, which have previously caused itching. She has not been in contact with anyone with similar symptoms and denies any recent outdoor exposures that could have triggered the rash.  No associated symptoms such as cough, stridor, wheezing, joint swelling, fever, or abnormal weight loss.   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 She reports stiffness in the knee but is managing.  -She also has a history of hidradenitis suppurativa, for which she previously underwent surgery. She was treated with clindamycin  topical antibiotic,  which was not effective. She has been using a topical steroid cream for pruritus, which provides some relief.Reports that problem is not well controlled, surgical treatment did not provide significant improvement. She follows with surgeon, who treats area with Silver Nitrate. She has a high copay and asking if I can prescribe something more effective.  HTN: Her blood pressure has been elevated, with recent SBP's readings in the 150s to 160s. She is currently taking amlodipine -Olmesartan  5-20 mg. Negative for unusual headache, CP,SOB, focal neurologic deficit, or significant edema.  Lab Results  Component Value Date   NA 138 01/31/2024   CL 107 01/31/2024   K 4.0 01/31/2024   CO2 23 01/31/2024   BUN 9 01/31/2024   CREATININE 0.63 01/31/2024   GFRNONAA >60 01/31/2024   CALCIUM  8.7 (L) 01/31/2024   ALBUMIN 4.0 12/21/2022   GLUCOSE 144 (H) 01/31/2024   Inquiring about FMLA to cover for days anxiety gets worse in case she needs to miss work. Anxiety and depression on Lexapro  20 mg daily and Alprazolam  0.5 mg 1/2-1 tab bid prn. CBT monthly. Problem aggravated by the lost of her husband and more recently her father (07/2023) as well as recent health issues.  Review of Systems  Constitutional:  Negative for activity change, appetite change, chills and fever.  HENT:  Negative for mouth sores and sore throat.   Eyes:  Negative for pain and visual disturbance.  Respiratory:  Negative for cough and wheezing.   Gastrointestinal:  Negative for abdominal pain, nausea and vomiting.  Genitourinary:  Negative for decreased urine volume, dysuria and hematuria.  Allergic/Immunologic: Positive for environmental allergies.  Neurological:  Negative for  syncope and facial asymmetry.  Psychiatric/Behavioral:  Negative for confusion and hallucinations.   See other pertinent positives and negatives in HPI.  Current Outpatient Medications on File Prior to Visit  Medication Sig Dispense Refill   ALPRAZolam   (XANAX ) 0.5 MG tablet Take 1/2-1 tablet (0.25-0.5 mg total) by mouth 2 (two) times daily as needed for anxiety. 30 tablet 1   atorvastatin  (LIPITOR) 40 MG tablet Take 1 tablet (40 mg total) by mouth daily. 90 tablet 2   clindamycin  (CLINDAGEL ) 1 % gel Apply 1 Application topically 2 (two) times daily for 15 days. 30 g 2   diclofenac  Sodium (VOLTAREN ) 1 % GEL Apply 1 Application topically daily as needed (pain.).     EPINEPHrine  (EPIPEN  2-PAK) 0.3 mg/0.3 mL IJ SOAJ injection Inject 0.3 mg into the muscle as needed for anaphylaxis. 2 each 2   escitalopram  (LEXAPRO ) 20 MG tablet Take 1 tablet (20 mg total) by mouth daily. (Patient taking differently: Take 40 mg by mouth daily.) 90 tablet 1   fluticasone  (FLONASE ) 50 MCG/ACT nasal spray Place 1 spray into both nostrils daily as needed for allergies.     HYDROcodone -acetaminophen  (NORCO/VICODIN) 5-325 MG tablet Take 1 tablet by mouth every 4 (four) hours as needed for severe pain (pain score 7-10). 42 tablet 0   methocarbamol  (ROBAXIN ) 500 MG tablet Take 1 tablet (500 mg total) by mouth every 6 (six) hours as needed for muscle spasms. 40 tablet 0   polyethylene glycol powder (GLYCOLAX /MIRALAX ) 17 GM/SCOOP powder Take 17 g by mouth 2 (two) times daily. Dissolve 1 capful (17g) in 4-8 ounces of liquid and take by mouth daily. 238 g 0   No current facility-administered medications on file prior to visit.    Past Medical History:  Diagnosis Date   Acid reflux    Acute lateral meniscus tear of right knee    Acute medial meniscus tear of right knee    Anxiety    Arthritis    Blood in stool    Chronic kidney disease    GERD (gastroesophageal reflux disease)    History of kidney stones    Hypercholesteremia    Hyperlipidemia    Hypertension    Allergies  Allergen Reactions   Apple Juice Anaphylaxis    NO RAW APPLES   Latex Anaphylaxis    Latex condom   Chlorhexidine  Gluconate Itching   Flounder [Fish Allergy] Swelling   Other Swelling    Oxycodone-Acetaminophen  Other (See Comments)    knocked me out Other reaction(s): Other (See Comments) knocked me out   Peanut Butter Flavoring Agent (Non-Screening) Itching    Social History   Socioeconomic History   Marital status: Married    Spouse name: Not on file   Number of children: Not on file   Years of education: Not on file   Highest education level: Not on file  Occupational History   Not on file  Tobacco Use   Smoking status: Never   Smokeless tobacco: Never  Vaping Use   Vaping status: Never Used  Substance and Sexual Activity   Alcohol use: Yes    Comment: social   Drug use: No   Sexual activity: Not Currently    Birth control/protection: Post-menopausal  Other Topics Concern   Not on file  Social History Narrative   Not on file   Social Drivers of Health   Financial Resource Strain: Not on file  Food Insecurity: No Food Insecurity (01/30/2024)   Hunger Vital Sign    Worried  About Running Out of Food in the Last Year: Never true    Ran Out of Food in the Last Year: Never true  Transportation Needs: No Transportation Needs (01/30/2024)   PRAPARE - Administrator, Civil Service (Medical): No    Lack of Transportation (Non-Medical): No  Physical Activity: Not on file  Stress: Not on file  Social Connections: Not on file    Vitals:   03/06/24 1016  BP: (!) 148/80  Pulse: 67  Resp: 16  Temp: 97.9 F (36.6 C)  SpO2: 99%   Body mass index is 25.77 kg/m.  Physical Exam Vitals and nursing note reviewed.  Constitutional:      General: She is not in acute distress.    Appearance: She is well-developed.  HENT:     Head: Atraumatic.     Mouth/Throat:     Mouth: Mucous membranes are moist.     Pharynx: Oropharynx is clear.  Eyes:     Conjunctiva/sclera: Conjunctivae normal.  Cardiovascular:     Rate and Rhythm: Normal rate and regular rhythm.     Heart sounds: No murmur heard.    Comments: Trace pitting RLE  edema. Pulmonary:     Effort: Pulmonary effort is normal. No respiratory distress.     Breath sounds: Normal breath sounds.  Musculoskeletal:     Right knee: Decreased range of motion. Tenderness present.  Lymphadenopathy:     Cervical: No cervical adenopathy.  Skin:    General: Skin is warm.     Findings: No erythema or rash.  Neurological:     General: No focal deficit present.     Mental Status: She is alert and oriented to person, place, and time.     Comments: Antalgic gait, assisted with a cane  Psychiatric:        Mood and Affect: Affect normal. Mood is anxious.   ASSESSMENT AND PLAN:  Ms. Roxene Alviar was seen today for rash and chronic disease management.  Diagnoses and all orders for this visit: Orders Placed This Encounter  Procedures   Ambulatory referral to Dermatology   Ambulatory referral to Psychiatry   Urticaria Rash is nit present at this time. We discussed possible etiologies. Recommend course pf prednisone  , some side effects discussed. Cetirizine 10 mg daily. For now she would like to hold on immunology referral. Instructed about warning signs.  -     predniSONE ; Take 2 tablets (40 mg total) by mouth daily with breakfast for 5 days.  Dispense: 10 tablet; Refill: 0  Depression, major, recurrent, moderate (HCC) Assessment & Plan: Problem is stable but not well controlled. Currently she is on Lexapro  20 mg. Exacerbated by recent health issues and the death of her father in 2023-07-12. She agrees with referral to psychiatrist. Continue CBT.  Orders: -     Ambulatory referral to Psychiatry  Hidradenitis suppurativa Assessment & Plan: Recently underwent surgical treatment, did not help significantly. She follows with surgeon, topical treatments have not been effective. Referral to dermatologist placed.  Orders: -     Ambulatory referral to Dermatology  Essential hypertension Assessment & Plan: Problem is not well controlled, reporting elevated  BP's at home. Currently she is on Amlodipine -Olmesartan  5-20 mg daily, recommend increasing dose of Amlodipine  to 10 mg. Continue low salt diet. Continue monitoring BP at home. F/U in 3 months.  Orders: -     amLODIPine -Olmesartan ; Take 1 tablet by mouth daily.  Dispense: 30 tablet; Refill: 1  GAD (generalized anxiety disorder)  Assessment & Plan: Problem is not well controlled. Currently she is on Lexapro  20 mg daily and Alprazolam  0.5 mg 0.5-1 tab bid prn. Currently on monthly CBT. Referral to psychiatrist placed.  Orders: -     Ambulatory referral to Psychiatry  Return in about 3 months (around 06/04/2024) for chronic problems.  Mannix Kroeker G. Analysia Dungee, MD  [facility]. [place] office.

## 2024-03-06 NOTE — Assessment & Plan Note (Signed)
 Problem is not well controlled. Currently she is on Lexapro  20 mg daily and Alprazolam  0.5 mg 0.5-1 tab bid prn. Currently on monthly CBT. Referral to psychiatrist placed.

## 2024-03-07 DIAGNOSIS — M25661 Stiffness of right knee, not elsewhere classified: Secondary | ICD-10-CM | POA: Diagnosis not present

## 2024-03-07 DIAGNOSIS — R269 Unspecified abnormalities of gait and mobility: Secondary | ICD-10-CM | POA: Diagnosis not present

## 2024-03-07 DIAGNOSIS — M25561 Pain in right knee: Secondary | ICD-10-CM | POA: Diagnosis not present

## 2024-03-07 DIAGNOSIS — R29898 Other symptoms and signs involving the musculoskeletal system: Secondary | ICD-10-CM | POA: Diagnosis not present

## 2024-03-08 ENCOUNTER — Ambulatory Visit: Admitting: Family Medicine

## 2024-03-11 DIAGNOSIS — R29898 Other symptoms and signs involving the musculoskeletal system: Secondary | ICD-10-CM | POA: Diagnosis not present

## 2024-03-11 DIAGNOSIS — M25661 Stiffness of right knee, not elsewhere classified: Secondary | ICD-10-CM | POA: Diagnosis not present

## 2024-03-11 DIAGNOSIS — R269 Unspecified abnormalities of gait and mobility: Secondary | ICD-10-CM | POA: Diagnosis not present

## 2024-03-11 DIAGNOSIS — M25561 Pain in right knee: Secondary | ICD-10-CM | POA: Diagnosis not present

## 2024-03-13 ENCOUNTER — Other Ambulatory Visit: Payer: Self-pay | Admitting: Family Medicine

## 2024-03-13 DIAGNOSIS — F411 Generalized anxiety disorder: Secondary | ICD-10-CM

## 2024-03-14 DIAGNOSIS — R29898 Other symptoms and signs involving the musculoskeletal system: Secondary | ICD-10-CM | POA: Diagnosis not present

## 2024-03-14 DIAGNOSIS — M25561 Pain in right knee: Secondary | ICD-10-CM | POA: Diagnosis not present

## 2024-03-14 DIAGNOSIS — R269 Unspecified abnormalities of gait and mobility: Secondary | ICD-10-CM | POA: Diagnosis not present

## 2024-03-14 DIAGNOSIS — M25661 Stiffness of right knee, not elsewhere classified: Secondary | ICD-10-CM | POA: Diagnosis not present

## 2024-03-15 ENCOUNTER — Other Ambulatory Visit (HOSPITAL_COMMUNITY): Payer: Self-pay

## 2024-03-15 DIAGNOSIS — Z5189 Encounter for other specified aftercare: Secondary | ICD-10-CM | POA: Diagnosis not present

## 2024-03-15 MED ORDER — ESCITALOPRAM OXALATE 20 MG PO TABS
20.0000 mg | ORAL_TABLET | Freq: Every day | ORAL | 1 refills | Status: AC
  Filled 2024-03-15: qty 90, 90d supply, fill #0

## 2024-03-15 MED ORDER — CELECOXIB 200 MG PO CAPS
200.0000 mg | ORAL_CAPSULE | Freq: Every day | ORAL | 2 refills | Status: DC
  Filled 2024-03-15: qty 30, 30d supply, fill #0

## 2024-03-18 ENCOUNTER — Ambulatory Visit: Admitting: Psychology

## 2024-03-18 DIAGNOSIS — F411 Generalized anxiety disorder: Secondary | ICD-10-CM

## 2024-03-18 DIAGNOSIS — F330 Major depressive disorder, recurrent, mild: Secondary | ICD-10-CM
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
## 2024-04-01 ENCOUNTER — Ambulatory Visit: Admitting: Psychology

## 2024-04-01 DIAGNOSIS — F331 Major depressive disorder, recurrent, moderate: Secondary | ICD-10-CM | POA: Diagnosis not present
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
## 2024-04-15 ENCOUNTER — Ambulatory Visit: Admitting: Psychology

## 2024-04-15 NOTE — Progress Notes (Unsigned)
   [name], PhD

## 2024-04-29 ENCOUNTER — Ambulatory Visit: Payer: Self-pay

## 2024-04-29 ENCOUNTER — Ambulatory Visit: Admitting: Psychology

## 2024-04-29 NOTE — Telephone Encounter (Signed)
 FYI Only or Action Required?: FYI only for provider: Home Care.  Patient was last seen in primary care on 03/06/2024 by Jordan, [name], MD.  Called Nurse Triage reporting Cough.  Symptoms began a week ago.  Interventions attempted: OTC medications: Mucinex .  Symptoms are: unchanged.  Triage Disposition: Home Care  Patient/caregiver understands and will follow disposition?: Yes   Copied from CRM #[id number]. Topic: Clinical - Medical Advice >> Apr 29, 2024  9:11 AM Mercedes MATSU wrote: Reason for CRM: Patient called in stating that she has had a cold/cough for about a week now. She said she wanted to speak with a nurse because the cough she has is annoying and she wants to know if there is anything at home she can do. She said she has tried drinking tea, and she is coughing some stuff up but the cough is still there. Patient requesting a call back and can be reached at [phone]. Reason for Disposition  Cough  Answer Assessment - Initial Assessment Questions 1. ONSET: When did the cough begin?      One week  2. SEVERITY: How bad is the cough today?       About the same since onst  3. SPUTUM: Describe the color of your sputum (e.g., none, dry cough; clear, white, yellow, green)      Denies  4. HEMOPTYSIS: Are you coughing up any blood? If Yes, ask: How much? (e.g., flecks, streaks, tablespoons, etc.)      Denies  5. DIFFICULTY BREATHING: Are you having difficulty breathing? If Yes, ask: How bad is it? (e.g., mild, moderate, severe)      Denies  6. FEVER: Do you have a fever? If Yes, ask: What is your temperature, how was it measured, and when did it start?      Denies  7. CARDIAC HISTORY: Do you have any history of heart disease? (e.g., heart attack, congestive heart failure)       HTN  8. LUNG HISTORY: Do you have any history of lung disease?  (e.g., pulmonary embolus, asthma, emphysema)      Pt doe snot have lung hx  9. PE RISK FACTORS: Do you have a  history of blood clots? (or: recent major surgery, recent prolonged travel, bedridden)      Pt does not have PE risk factors  10. OTHER SYMPTOMS: Pt denies runny nose, wheezing, chest pain        Pt has hoarseness  Pt reports cough Pt is taking OTC mucinex  Pt agrees with plan of care, will call back for any worsening symptoms  Protocols used: Cough - Acute Productive-A-AH

## 2024-04-29 NOTE — Progress Notes (Unsigned)
   [name], PhD

## 2024-04-29 NOTE — Telephone Encounter (Signed)
 Attempted to contact patient x 1 to discuss symptoms; LVM to return call, Will attempt to contact patient at a later time to further discuss concerns.                Copied from CRM #[id number]. Topic: Clinical - Medical Advice >> Apr 29, 2024  9:11 AM Mercedes MATSU wrote: Reason for CRM: Patient called in stating that she has had a cold/cough for about a week now. She said she wanted to speak with a nurse because the cough she has is annoying and she wants to know if there is anything at home she can do. She said she has tried drinking tea, and she is coughing some stuff up but the cough is still there. Patient requesting a call back and can be reached at [phone].

## 2024-04-30 ENCOUNTER — Other Ambulatory Visit (HOSPITAL_COMMUNITY): Payer: Self-pay

## 2024-04-30 ENCOUNTER — Encounter: Payer: Self-pay | Admitting: Family Medicine

## 2024-04-30 ENCOUNTER — Ambulatory Visit: Admitting: Family Medicine

## 2024-04-30 VITALS — BP 128/60 | HR 62 | Temp 97.8°F | Resp 16 | Ht 61.0 in | Wt 131.4 lb

## 2024-04-30 DIAGNOSIS — J069 Acute upper respiratory infection, unspecified: Secondary | ICD-10-CM | POA: Diagnosis not present

## 2024-04-30 DIAGNOSIS — R112 Nausea with vomiting, unspecified: Secondary | ICD-10-CM | POA: Diagnosis not present

## 2024-04-30 DIAGNOSIS — R051 Acute cough: Secondary | ICD-10-CM

## 2024-04-30 MED ORDER — HYDROCODONE BIT-HOMATROP MBR 5-1.5 MG/5ML PO SOLN
5.0000 mL | Freq: Two times a day (BID) | ORAL | 0 refills | Status: AC | PRN
  Filled 2024-04-30: qty 80, 8d supply, fill #0

## 2024-04-30 MED ORDER — ONDANSETRON 4 MG PO TBDP
4.0000 mg | ORAL_TABLET | Freq: Two times a day (BID) | ORAL | 0 refills | Status: AC | PRN
  Filled 2024-04-30: qty 10, 5d supply, fill #0

## 2024-04-30 MED ORDER — BENZONATATE 100 MG PO CAPS
100.0000 mg | ORAL_CAPSULE | Freq: Two times a day (BID) | ORAL | 0 refills | Status: AC | PRN
  Filled 2024-04-30: qty 20, 10d supply, fill #0

## 2024-04-30 NOTE — Patient Instructions (Addendum)
 A few things to remember from today's visit:  URI, acute  Acute cough - Plan: benzonatate  (TESSALON ) 100 MG capsule, HYDROcodone  bit-homatropine (HYCODAN) 5-1.5 MG/5ML syrup  Nausea and vomiting in adult - Plan: ondansetron  (ZOFRAN -ODT) 4 MG disintegrating tablet viral infections are self-limited and we treat each symptom depending of severity.  Over the counter medications as decongestants and cold medications usually help, they need to be taken with caution if there is a history of high blood pressure or palpitations. Tylenol  and/or Ibuprofen  also helps with most symptoms (headache, muscle aching, fever,etc) Plenty of fluids. Honey helps with cough. Steam inhalations helps with runny nose, nasal congestion, and may prevent sinus infections. Cough and nasal congestion could last a few days and sometimes weeks. Please follow in not any better in 1-2 weeks or if symptoms get worse.  Symptoms most likely related with respiratory viral infection. Nausea and vomiting can be aggravated by cough. Monitor for more episodes of diarrhea. Bland diet and adequate hydration. Monitor for fever. Cough syrup to take at bedtime as needed for cough. Over-the-counter plain Mucinex  may also help.  If you need refills for medications you take chronically, please call your pharmacy. Do not use My Chart to request refills or for acute issues that need immediate attention. If you send a my chart message, it may take a few days to be addressed, specially if I am not in the office.  Please be sure medication list is accurate. If a new problem present, please set up appointment sooner than planned today.

## 2024-04-30 NOTE — Progress Notes (Signed)
 "  ACUTE VISIT Chief Complaint  Patient presents with   Sinus Problem    X 9 days coughing, sore throat, chills, mucus, throwing up on and off depending on what she eats    Discussed the use of AI scribe software for clinical note transcription with the patient, who gave verbal consent to proceed.  History of Present Illness Alice Ryan is a 64 year old female with past medical history significant for hypertension, allergy rhinitis, hyperlipidemia, and GAD who is here today with above complaints.  She began experiencing cold like symptoms on April 22, 2024. Initial symptoms included a sore throat, which worsened over time, and a severe cough. She also experienced intermittent chills, though not severe.  She describes episodes of vomiting, particularly after eating, which she attributes to coughing spells. This has occurred twice since above symptoms onset, with the most recent episode happening last night after consuming fried food. She notes a lack of appetite but attempts to eat despite this.  Her sore throat persists and tends to worsen at night. She has been coughing up phlegm for the past three days, which is thick and occasionally yellow. No hemoptysis. She reports nasal dryness due to a septal perforation, requiring her to blow her nose to clear hardened mucus.  No runny nose, headache, wheezing, difficulty breathing, chest pain, or abdominal pain. She reports a slight change in bowel habits with loose stools, having one stool yesterday.  Normal urine output.  For symptom management, she has been taking over-the-counter medications including Theraflu with honey, a 12-hour cough medicine, and Vicks pills for day and night. These have not significantly alleviated her cough, which is affecting her sleep.  She mentions a recent family gathering following her aunt's death on 30-Apr-2024, but clarifies that her symptoms began after this event. She has not tested for flu or  COVID-19 at home.  Review of Systems  Constitutional:  Positive for activity change and appetite change. Negative for fever.  HENT:  Positive for postnasal drip. Negative for mouth sores and trouble swallowing.   Eyes:  Negative for discharge and redness.  Respiratory:  Negative for stridor.   Cardiovascular:  Negative for leg swelling.  Genitourinary:  Negative for decreased urine volume, dysuria and hematuria.  Skin:  Negative for rash.  Allergic/Immunologic: Positive for environmental allergies.  Neurological:  Negative for syncope and weakness.  Psychiatric/Behavioral:  Negative for confusion.   See other pertinent positives and negatives in HPI.  Medications Ordered Prior to Encounter[1]  Past Medical History:  Diagnosis Date   Acid reflux    Acute lateral meniscus tear of right knee    Acute medial meniscus tear of right knee    Anxiety    Arthritis    Blood in stool    Chronic kidney disease    GERD (gastroesophageal reflux disease)    History of kidney stones    Hypercholesteremia    Hyperlipidemia    Hypertension    Allergies[2]  Social History   Socioeconomic History   Marital status: Married    Spouse name: Not on file   Number of children: Not on file   Years of education: Not on file   Highest education level: Not on file  Occupational History   Not on file  Tobacco Use   Smoking status: Never   Smokeless tobacco: Never  Vaping Use   Vaping status: Never Used  Substance and Sexual Activity   Alcohol use: Yes    Comment:  social   Drug use: No   Sexual activity: Not Currently    Birth control/protection: Post-menopausal  Other Topics Concern   Not on file  Social History Narrative   Not on file   Social Drivers of Health   Tobacco Use: Low Risk (04/30/2024)   Patient History    Smoking Tobacco Use: Never    Smokeless Tobacco Use: Never    Passive Exposure: Not on file  Financial Resource Strain: Not on file  Food Insecurity: No Food  Insecurity (01/30/2024)   Epic    Worried About Programme Researcher, Broadcasting/film/video in the Last Year: Never true    Ran Out of Food in the Last Year: Never true  Transportation Needs: No Transportation Needs (01/30/2024)   Epic    Lack of Transportation (Medical): No    Lack of Transportation (Non-Medical): No  Physical Activity: Not on file  Stress: Not on file  Social Connections: Not on file  Depression (PHQ2-9): High Risk (03/06/2024)   Depression (PHQ2-9)    PHQ-2 Score: 14  Alcohol Screen: Not on file  Housing: Low Risk (01/30/2024)   Epic    Unable to Pay for Housing in the Last Year: No    Number of Times Moved in the Last Year: 0    Homeless in the Last Year: No  Utilities: Not At Risk (01/30/2024)   Epic    Threatened with loss of utilities: No  Health Literacy: Not on file   Vitals:   04/30/24 1005  BP: 128/60  Pulse: 62  Resp: 16  Temp: 97.8 F (36.6 C)  SpO2: 99%   Body mass index is 24.83 kg/m.  Physical Exam Vitals and nursing note reviewed.  Constitutional:      General: She is not in acute distress.    Appearance: She is well-developed. She is not ill-appearing.  HENT:     Head: Atraumatic.     Nose: Congestion and rhinorrhea present.     Right Sinus: No maxillary sinus tenderness or frontal sinus tenderness.     Left Sinus: No maxillary sinus tenderness or frontal sinus tenderness.     Comments: Traces of blood in right nostril, no active bleeding.    Mouth/Throat:     Pharynx: Postnasal drip present.  Eyes:     Conjunctiva/sclera: Conjunctivae normal.  Cardiovascular:     Rate and Rhythm: Normal rate and regular rhythm.     Heart sounds: No murmur heard. Pulmonary:     Effort: Pulmonary effort is normal. No respiratory distress.     Breath sounds: Normal breath sounds. No stridor.  Lymphadenopathy:     Cervical: No cervical adenopathy.  Skin:    General: Skin is warm.     Findings: No erythema or rash.  Neurological:     Mental Status: She is alert  and oriented to person, place, and time.  Psychiatric:        Mood and Affect: Mood and affect normal.   ASSESSMENT AND PLAN:  Alice Ryan was seen today for sinus problem.  Diagnoses and all orders for this visit:  URI, acute Symptoms suggests a viral etiology, most symptoms have resolved. Recommend symptomatic treatment for now. Because of history of perforated septum, I do not recommend Flonase  or Atrovent. Adequate hydration. Instructed to monitor for signs of complications, including new onset of fever among some, instructed about warning signs. I also explained that cough and nasal congestion can last a few days and sometimes weeks. F/U as needed.  Acute cough Lung auscultation negative, so I do not think imaging is needed at this time. Symptomatic treatment with benzonatate  during the day and Hycodan at night recommended. Monitor for new symptoms.  -     benzonatate  (TESSALON ) 100 MG capsule; Take 1 capsule (100 mg total) by mouth 2 (two) times daily as needed for up to 10 days. -     HYDROcodone  bit-homatropine (HYCODAN) 5-1.5 MG/5ML syrup; Take 5 mLs by mouth every 12 (twelve) hours as needed for up to 10 days.  Nausea and vomiting in adult ?  Post tussive. She is reporting some loose stools, monitor for new GI symptoms. Adequate hydration and bland diet recommended. Ondansetron  4 mg twice daily as needed recommended. Follow-up as needed. -     ondansetron  (ZOFRAN -ODT) 4 MG disintegrating tablet; Take 1 tablet (4 mg total) by mouth 2 (two) times daily as needed for up to 5 days for nausea or vomiting.  Return if symptoms worsen or fail to improve, for keep next appointment.  Dasan Hardman G. Bretton Tandy, MD  [facility]. [place] office.    [1]  Current Outpatient Medications on File Prior to Visit  Medication Sig Dispense Refill   ALPRAZolam  (XANAX ) 0.5 MG tablet Take 1/2-1 tablet (0.25-0.5 mg total) by mouth 2 (two) times daily as needed for anxiety. 30  tablet 1   amlodipine -olmesartan  (AZOR ) 10-20 MG tablet Take 1 tablet by mouth daily. 30 tablet 1   atorvastatin  (LIPITOR) 40 MG tablet Take 1 tablet (40 mg total) by mouth daily. 90 tablet 2   EPINEPHrine  (EPIPEN  2-PAK) 0.3 mg/0.3 mL IJ SOAJ injection Inject 0.3 mg into the muscle as needed for anaphylaxis. 2 each 2   escitalopram  (LEXAPRO ) 20 MG tablet Take 1 tablet (20 mg total) by mouth daily. 90 tablet 1   fluticasone  (FLONASE ) 50 MCG/ACT nasal spray Place 1 spray into both nostrils daily as needed for allergies.     No current facility-administered medications on file prior to visit.  [2]  Allergies Allergen Reactions   Apple Juice Anaphylaxis    NO RAW APPLES   Latex Anaphylaxis    Latex condom   Chlorhexidine  Gluconate Itching   Flounder [Fish Allergy] Swelling   Other Swelling   Oxycodone-Acetaminophen  Other (See Comments)    knocked me out Other reaction(s): Other (See Comments) knocked me out   Peanut Butter Flavoring Agent (Non-Screening) Itching   "

## 2024-05-10 ENCOUNTER — Other Ambulatory Visit: Payer: Self-pay

## 2024-05-13 ENCOUNTER — Ambulatory Visit: Admitting: Psychology

## 2024-05-27 ENCOUNTER — Ambulatory Visit: Admitting: Psychology

## 2024-06-10 ENCOUNTER — Ambulatory Visit: Admitting: Psychology

## 2024-06-24 ENCOUNTER — Ambulatory Visit: Admitting: Psychology

## 2024-07-08 ENCOUNTER — Ambulatory Visit: Admitting: Psychology

## 2024-07-22 ENCOUNTER — Ambulatory Visit: Admitting: Psychology

## 2024-08-05 ENCOUNTER — Ambulatory Visit: Admitting: Psychology

## 2024-08-19 ENCOUNTER — Ambulatory Visit: Admitting: Psychology

## 2024-09-02 ENCOUNTER — Ambulatory Visit: Admitting: Psychology

## 2024-09-16 ENCOUNTER — Ambulatory Visit: Admitting: Psychology

## 2024-09-30 ENCOUNTER — Ambulatory Visit: Admitting: Psychology

## 2024-10-14 ENCOUNTER — Ambulatory Visit: Admitting: Psychology

## 2024-10-28 ENCOUNTER — Ambulatory Visit: Admitting: Psychology
